# Patient Record
Sex: Female | Born: 1940 | Race: Black or African American | Hispanic: No | Smoking: Never smoker
Health system: Southern US, Community
[De-identification: ages and names within clinical notes are randomized; demographics above are authoritative.]

## PROBLEM LIST (undated history)

## (undated) DIAGNOSIS — M1990 Unspecified osteoarthritis, unspecified site: Secondary | ICD-10-CM

## (undated) DIAGNOSIS — I639 Cerebral infarction, unspecified: Secondary | ICD-10-CM

## (undated) DIAGNOSIS — R569 Unspecified convulsions: Secondary | ICD-10-CM

---

## 1999-07-11 HISTORY — PX: HERNIA REPAIR: SHX51

## 2015-01-18 ENCOUNTER — Emergency Department (HOSPITAL_COMMUNITY): Payer: Self-pay

## 2015-01-18 ENCOUNTER — Emergency Department (HOSPITAL_COMMUNITY)
Admission: EM | Admit: 2015-01-18 | Discharge: 2015-01-18 | Disposition: A | Discharge status: Home / Self Care | Payer: Self-pay | Attending: Emergency Medicine | Admitting: Emergency Medicine

## 2015-01-18 ENCOUNTER — Emergency Department (HOSPITAL_BASED_OUTPATIENT_CLINIC_OR_DEPARTMENT_OTHER): Payer: Self-pay

## 2015-01-18 ENCOUNTER — Encounter (HOSPITAL_COMMUNITY): Payer: Self-pay | Admitting: *Deleted

## 2015-01-18 DIAGNOSIS — Z79899 Other long term (current) drug therapy: Secondary | ICD-10-CM | POA: Insufficient documentation

## 2015-01-18 DIAGNOSIS — M79609 Pain in unspecified limb: Secondary | ICD-10-CM

## 2015-01-18 DIAGNOSIS — M79622 Pain in left upper arm: Secondary | ICD-10-CM | POA: Insufficient documentation

## 2015-01-18 DIAGNOSIS — Z8739 Personal history of other diseases of the musculoskeletal system and connective tissue: Secondary | ICD-10-CM | POA: Insufficient documentation

## 2015-01-18 DIAGNOSIS — M7989 Other specified soft tissue disorders: Secondary | ICD-10-CM

## 2015-01-18 DIAGNOSIS — Z8673 Personal history of transient ischemic attack (TIA), and cerebral infarction without residual deficits: Secondary | ICD-10-CM | POA: Insufficient documentation

## 2015-01-18 DIAGNOSIS — R531 Weakness: Secondary | ICD-10-CM | POA: Insufficient documentation

## 2015-01-18 DIAGNOSIS — R079 Chest pain, unspecified: Secondary | ICD-10-CM | POA: Insufficient documentation

## 2015-01-18 HISTORY — DX: Unspecified osteoarthritis, unspecified site: M19.90

## 2015-01-18 HISTORY — DX: Cerebral infarction, unspecified: I63.9

## 2015-01-18 LAB — I-STAT TROPONIN, ED: Troponin i, poc: 0.01 ng/mL (ref 0.00–0.08)

## 2015-01-18 LAB — CBC
HCT: 35.7 % — ABNORMAL LOW (ref 36.0–46.0)
HEMOGLOBIN: 13.2 g/dL (ref 12.0–15.0)
MCH: 31.3 pg (ref 26.0–34.0)
MCHC: 37 g/dL — ABNORMAL HIGH (ref 30.0–36.0)
MCV: 84.6 fL (ref 78.0–100.0)
PLATELETS: 279 10*3/uL (ref 150–400)
RBC: 4.22 MIL/uL (ref 3.87–5.11)
RDW: 17.4 % — ABNORMAL HIGH (ref 11.5–15.5)
WBC: 11.2 10*3/uL — ABNORMAL HIGH (ref 4.0–10.5)

## 2015-01-18 LAB — BASIC METABOLIC PANEL
Anion gap: 8 (ref 5–15)
BUN: 11 mg/dL (ref 6–20)
CALCIUM: 9 mg/dL (ref 8.9–10.3)
CO2: 26 mmol/L (ref 22–32)
CREATININE: 0.89 mg/dL (ref 0.44–1.00)
Chloride: 106 mmol/L (ref 101–111)
Glucose, Bld: 87 mg/dL (ref 65–99)
Potassium: 3.6 mmol/L (ref 3.5–5.1)
SODIUM: 140 mmol/L (ref 135–145)

## 2015-01-18 LAB — URINALYSIS, ROUTINE W REFLEX MICROSCOPIC
BILIRUBIN URINE: NEGATIVE
GLUCOSE, UA: NEGATIVE mg/dL
Hgb urine dipstick: NEGATIVE
Ketones, ur: NEGATIVE mg/dL
NITRITE: NEGATIVE
PH: 6.5 (ref 5.0–8.0)
Protein, ur: NEGATIVE mg/dL
Specific Gravity, Urine: 1.022 (ref 1.005–1.030)
Urobilinogen, UA: 0.2 mg/dL (ref 0.0–1.0)

## 2015-01-18 LAB — URINE MICROSCOPIC-ADD ON

## 2015-01-18 MED ORDER — IOHEXOL 350 MG/ML SOLN
80.0000 mL | Freq: Once | INTRAVENOUS | Status: AC | PRN
  Administered 2015-01-18: 80 mL via INTRAVENOUS

## 2015-01-18 NOTE — ED Provider Notes (Signed)
CSN: [id number]     Arrival date & time 01/18/15  1307 History   First MD Initiated Contact with Patient 01/18/15 1308     Chief Complaint  Patient presents with  . Arm Pain  . Hip Pain     (Consider location/radiation/quality/duration/timing/severity/associated sxs/prior Treatment) Patient is a 74 y.o. female presenting with chest pain and hip pain. The history is provided by a relative. The history is limited by a language barrier.  Chest Pain Pain location:  Substernal area Pain quality: sharp   Pain radiates to:  Does not radiate Pain radiates to the back: no   Pain severity:  Moderate Onset quality:  Gradual Duration:  4 days Timing:  Constant Progression:  Unchanged Chronicity:  New Context comment:  Recent travel from [place] Relieved by:  Nothing Worsened by:  Nothing tried Ineffective treatments:  None tried Associated symptoms: no abdominal pain, no fever and no lower extremity edema   Associated symptoms comment:  L hip pain, l leg pain Hip Pain This is a recurrent problem. The current episode started 2 days ago. The problem occurs constantly. The problem has not changed since onset.Associated symptoms include chest pain. Pertinent negatives include no abdominal pain. Nothing aggravates the symptoms. Nothing relieves the symptoms. She has tried nothing for the symptoms.    Past Medical History  Diagnosis Date  . Stroke   . Arthritis    History reviewed. No pertinent past surgical history. No family history on file. History  Substance Use Topics  . Smoking status: Never Smoker   . Smokeless tobacco: Not on file  . Alcohol Use: No   OB History    No data available     Review of Systems  Constitutional: Negative for fever.  Cardiovascular: Positive for chest pain.  Gastrointestinal: Negative for abdominal pain.  All other systems reviewed and are negative.     Allergies  Review of patient's allergies indicates no known allergies.  Home Medications    Prior to Admission medications   Medication Sig Start Date End Date Taking? Authorizing Provider  Cholecalciferol (VITAMIN D-3 PO) Take 5 mLs by mouth daily.   Yes Historical Provider, MD  Cyanocobalamin (VITAMIN B-12 PO) Take 5 mLs by mouth daily.   Yes Historical Provider, MD  dexamethasone (DECADRON) 2 MG tablet Take 1 mg by mouth daily. Daughter states she has two more weeks left on medication   Yes Historical Provider, MD  omeprazole (PRILOSEC) 20 MG capsule Take 20 mg by mouth daily.   Yes Historical Provider, MD  PRESCRIPTION MEDICATION Take 1 tablet by mouth daily. Medication Name: Cebrotonin  tablet (European Medication) per daughter   Yes Historical Provider, MD  PRESCRIPTION MEDICATION Take 10 mLs by mouth daily. Medication Name: Somazina  (Europen Medication) per daughter   Yes Historical Provider, MD  Pyridoxine HCl (VITAMIN B-6 PO) Take 5 mLs by mouth daily.   Yes Historical Provider, MD   BP 146/69 mmHg  Pulse 58  Temp(Src) 98 F (36.7 C) (Oral)  Resp 17  Wt 180 lb (81.647 kg)  SpO2 99% Physical Exam  Constitutional: She is oriented to person, place, and time. She appears well-developed and well-nourished.  HENT:  Head: Normocephalic and atraumatic.  Right Ear: External ear normal.  Left Ear: External ear normal.  Eyes: Conjunctivae and EOM are normal. Pupils are equal, round, and reactive to light.  Neck: Normal range of motion. Neck supple.  Cardiovascular: Normal rate, regular rhythm, normal heart sounds and intact distal pulses.  Pulmonary/Chest: Effort normal and breath sounds normal.  Abdominal: Soft. Bowel sounds are normal. There is no tenderness.  Musculoskeletal: Normal range of motion.       Cervical back: Normal.       Thoracic back: Normal.       Lumbar back: She exhibits tenderness and bony tenderness.       Left lower leg: She exhibits swelling (<1 cm asymmetry with opposite side).  Neurological: She is alert and oriented to person,  place, and time.  Skin: Skin is warm and dry.  Vitals reviewed.   ED Course  Procedures (including critical care time) Labs Review Labs Reviewed  CBC - Abnormal; Notable for the following:    WBC 11.2 (*)    HCT 35.7 (*)    MCHC 37.0 (*)    RDW 17.4 (*)    All other components within normal limits  URINALYSIS, ROUTINE W REFLEX MICROSCOPIC (NOT AT [facility]) - Abnormal; Notable for the following:    Leukocytes, UA SMALL (*)    All other components within normal limits  BASIC METABOLIC PANEL  URINE MICROSCOPIC-ADD ON  I-STAT TROPOININ, ED    Imaging Review No results found.   EKG Interpretation   Date/Time:  Monday January 18 2015 13:19:55 EDT Ventricular Rate:  59 PR Interval:  164 QRS Duration: 86 QT Interval:  481 QTC Calculation: 476 R Axis:   -65 Text Interpretation:  Ectopic atrial rhythm Inferior infarct, age  indeterminate Probable anterior infarct, age indeterminate No significant  change since last tracing Confirmed by Mirian MoGentry, Matthew [phone]([id number]) on  01/18/2015 2:20:05 PM      MDM   Final diagnoses:  Weakness    74 y.o. female with pertinent PMH of reported CVA with L sided deficit which occurred in [place] presents with chest pain, L leg pain and swelling.  I examined the pt's limited records from [place] which only demonstrated a negative MRI of the head.  She has had persistent L sided weakness since 3 months ago, started shortly after she flew to [place] for the death of her son.  She denies increase in neuro symptoms.  L leg mildly swollen on L, however otherwise consistent with sciatica, has reproduction of pain with palpation of L paraspinal muscles of lumbar spine.  Given that she has had recent travel, will obtain DVT study of L LE, as well as CT scan of chest.     Pt care to Dr. Rubin PayorPickering pending dispo.   I have reviewed all laboratory and imaging studies if ordered as above  1. Weakness        Mirian MoMatthew Gentry, MD 01/21/15 1700

## 2015-01-18 NOTE — ED Provider Notes (Signed)
  Physical Exam  BP 151/85 mmHg  Pulse 53  Temp(Src) 98 F (36.7 C) (Oral)  Resp 20  Wt 180 lb (81.647 kg)  SpO2 99%  Physical Exam  ED Course  Procedures  MDM Patient with some left-sided tightness. Has had previous hemorrhagic stroke in [place]. Imaging reports reviewed from there. Head CT is likely evolution of the changes from that. Some leg pain and negative Doppler. Negative chest CT for PE but does have some pulmonary hypertension. She has a primary care doctor she can follow-up with. Will have follow-up with neurology. Discussed with neuro radiolog.      Nation Cradle PickerBenjiman Coreing, MD 01/18/15 [phone]

## 2015-01-18 NOTE — ED Notes (Signed)
MD at bedside. 

## 2015-01-18 NOTE — ED Notes (Signed)
Per pts family- pt recently returned from a trip to [place] and has left arm and hip pain. Pt denies any new injuries. Pt has previous stroke with left sided weakness. Pt uses a wheelchair at baseline. Pt also reports central chest pain.

## 2015-01-18 NOTE — ED Notes (Signed)
CSW/RN CM spoke with pt's daughter/grandaughter re: applying for Medicaid for pt and [facility].  RNCM also exploring the [facility].  CSW will continue to follow.

## 2015-01-18 NOTE — Discharge Instructions (Signed)

## 2015-01-18 NOTE — Progress Notes (Signed)
VASCULAR LAB PRELIMINARY  PRELIMINARY  PRELIMINARY  PRELIMINARY  Left lower extremity venous duplexompleted.    Preliminary report:  Left:  No evidence of DVT, superficial thrombosis, or Baker's cyst.  Reva Pinkley, RVS 01/18/2015, 4:45 PM

## 2015-01-20 ENCOUNTER — Ambulatory Visit: Payer: Self-pay | Admitting: Neurology

## 2015-02-03 ENCOUNTER — Ambulatory Visit: Payer: Self-pay | Admitting: Neurology

## 2015-02-03 ENCOUNTER — Encounter: Payer: Self-pay | Admitting: Neurology

## 2015-02-03 ENCOUNTER — Ambulatory Visit (INDEPENDENT_AMBULATORY_CARE_PROVIDER_SITE_OTHER): Payer: Self-pay | Admitting: Neurology

## 2015-02-03 VITALS — BP 146/92 | HR 85 | Temp 98.0°F | Ht 63.0 in

## 2015-02-03 DIAGNOSIS — R471 Dysarthria and anarthria: Secondary | ICD-10-CM

## 2015-02-03 DIAGNOSIS — I610 Nontraumatic intracerebral hemorrhage in hemisphere, subcortical: Secondary | ICD-10-CM

## 2015-02-03 DIAGNOSIS — G8190 Hemiplegia, unspecified affecting unspecified side: Secondary | ICD-10-CM

## 2015-02-03 DIAGNOSIS — G8110 Spastic hemiplegia affecting unspecified side: Secondary | ICD-10-CM | POA: Insufficient documentation

## 2015-02-03 DIAGNOSIS — I619 Nontraumatic intracerebral hemorrhage, unspecified: Secondary | ICD-10-CM | POA: Insufficient documentation

## 2015-02-03 MED ORDER — BACLOFEN 10 MG PO TABS: 10.0000 mg | ORAL_TABLET | Freq: Three times a day (TID) | ORAL | Status: DC

## 2015-02-03 NOTE — Progress Notes (Addendum)
[facility]    Provider:  Dr Lucia Gaskins Referring Provider: No ref. provider found Primary Care Physician:  Jackie Plum, MD  CC:  stroke  HPI:  Michelle Kemp is a 74 y.o. female here as a referral for stoke. She was in [place] in April. She is with her daughters who act as interpreters (she was not flagged, will ask for interpreter at next appointment). One morning after eating breakfast she couldn't get up. Her left side was not moving. No vision changes, could see from both eyes. She couldn't talk very well. She is feeling better. She is still weak on the left. Her left arm and leg still weak.Experiencing stiffness and pain in the left arm and leg. Her speech is much better. She denies sensory changes on the left side. Things are getting better per daughter. They could not pinpoint what caused the hemorrhage. Spent 8 days in the hospital. Not choking on food. Has a good appetite.    Reviewed notes, labs and imaging from outside physicians, which showed:  CT of the head 01/18/2015 (personally reviewed images): Study discussed by telephone with Dr. Carmell Austria on 01/18/2015 at 1725 hrs. 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 No new symptoms since that time.  The noncontrast CT appearance today is compatible with an evolving hemorrhagic infarct. No associated mass effect identified.  I advised outpatient Neurology and Brain MRI follow-up for this Patient.  Bmp, cbc wnl  Review of Systems: Patient complains of symptoms per HPI as well as the following symptoms: headache, snoring, joint pain, cramps, aching muscles, coordination problem. Pertinent negatives per HPI. All others negative.   History   Social History  . Marital Status: Widowed    Spouse Name: N/A  . Number of Children: 2  . Years of Education: N/A   Occupational History  . Not on file.   Social  History Main Topics  . Smoking status: Never Smoker   . Smokeless tobacco: Not on file  . Alcohol Use: No  . Drug Use: No  . Sexual Activity: Not on file   Other Topics Concern  . Not on file   Social History Narrative   Lives with family   Caffeine use: none     Family History  Problem Relation Age of Onset  . Arthritis    . Stroke Neg Hx     Past Medical History  Diagnosis Date  . Stroke   . Arthritis     Past Surgical History  Procedure Laterality Date  . Hernia repair  2001    Current Outpatient Prescriptions  Medication Sig Dispense Refill  . Cholecalciferol (VITAMIN D-3 PO) Take 5 mLs by mouth daily.    . Cyanocobalamin (VITAMIN B-12 PO) Take 5 mLs by mouth daily.    Marland Kitchen PRESCRIPTION MEDICATION Take 1 tablet by mouth daily. Medication Name: Cebrotonin  tablet (European Medication) per daughter    . PRESCRIPTION MEDICATION Take 10 mLs by mouth daily. Medication Name: Somazina  (Europen Medication) per daughter    . Pyridoxine HCl (VITAMIN B-6 PO) Take 5 mLs by mouth daily.    . baclofen (LIORESAL) 10 MG tablet Take 1 tablet (10 mg total) by mouth 3 (three) times daily. 90 each 6  . dexamethasone (DECADRON) 2 MG tablet Take 1 mg by mouth daily. Daughter states she has finished this medication.    Marland Kitchen omeprazole (PRILOSEC) 20 MG capsule Take 20 mg by mouth  daily.     No current facility-administered medications for this visit.    Allergies as of 02/03/2015  . (No Known Allergies)    Vitals: BP 146/92 mmHg  Pulse 85  Temp(Src) 98 F (36.7 C) (Oral)  Ht 5\' 3"  (1.6 m) Last Weight:  Wt Readings from Last 1 Encounters:  01/18/15 180 lb (81.647 kg)   Last Height:   Ht Readings from Last 1 Encounters:  02/03/15 5\' 3"  (1.6 m)   Physical exam: Exam: Gen: NAD, conversant, well nourised, obese, well groomed                     CV: RRR, no MRG. No Carotid Bruits. No peripheral edema, warm, nontender Eyes: Conjunctivae clear without exudates or  hemorrhage  Neuro: Detailed Neurologic Exam  Speech:    Speech is normal; fluent and spontaneous with normal comprehension.  Cognition:    The patient is oriented to person, place, and time;     recent and remote memory intact;     language fluent;     normal attention, concentration,     fund of knowledge Cranial Nerves:    The pupils are equal, round, and reactive to light. attempted fundi, could not visualize. . Visual fields are full to finger confrontation. Extraocular movements are intact. Trigeminal sensation is intact and the muscles of mastication are normal. The face is symmetric. The palate elevates in the midline. Hearing intact. Voice is normal. Shoulder shrug is normal. The tongue has normal motion without fasciculations.   Coordination:    Cannot perform on the left due to weakness  Gait:     Attempted, cannot stand without considerable assistance.     Motor Observation: No tremor Tone:    Increased left side    Posture:    Posture is normal in the wheelchair    Strength:       Left hemiparesis 1-2/5     Sensation: intact to LT bilaterally, denies hemisensory loss     Reflex Exam:  DTR's:    Deep tendon reflexes in the upper and lower extremities are brisk on the left    Toes:    Left upgoing   Clonus:    Clonus left achilles.    Assessment/Plan:  74 year old female with  right centrum semiovale hemorrhagic infarct in [place] in June. Resultant left spastic hemiparesis. Denies any significant PMHx or HTN.   MRI of the brain and MRA of the head ordered Spasticity - self pay, may consider botox in the future with medicaid approval. For now will start Baclofen. Stretching with PT. Discussed in detail spasticity after stroke.  Referral to PT, OT and speech Here with lovely daughters who provide suppport.  She will get records from [place] and bring them to next appointment.  Naomie Dean, MD  [facility] [address] [place], [place] [address]  Phone [phone] Fax [phone]

## 2015-02-03 NOTE — Patient Instructions (Signed)
Overall you are doing fairly well but I do want to suggest a few things today:   Remember to drink plenty of fluid, eat healthy meals and do not skip any meals. Try to eat protein with a every meal and eat a healthy snack such as fruit or nuts in between meals. Try to keep a regular sleep-wake schedule and try to exercise daily, particularly in the form of walking, 20-30 minutes a day, if you can.   As far as your medications are concerned, I would like to suggest: Baclofen  three times a day  As far as diagnostic testing: MRI of the brain and MRA of the head Physical and occupational and speech therapy  I would like to see you back in 2 months, sooner if we need to. Please call us with any interim questions, concerns, problems, updates or refill requests.   Please also call us for any test results so we can go over those with you on the phone.  My clinical assistant and will answer any of your questions and relay your messages to me and also relay most of my messages to you.   Our phone number is [phone]. We also have an after hours call service for urgent matters and there is a physician on-call for urgent questions. For any emergencies you know to call 911 or go to the nearest emergency room

## 2015-02-05 ENCOUNTER — Other Ambulatory Visit: Payer: Self-pay | Admitting: Neurology

## 2015-02-05 ENCOUNTER — Telehealth: Payer: Self-pay | Admitting: Rehabilitative and Restorative Service Providers"

## 2015-02-05 ENCOUNTER — Ambulatory Visit: Payer: Self-pay | Attending: Neurology | Admitting: Rehabilitative and Restorative Service Providers"

## 2015-02-05 DIAGNOSIS — M6281 Muscle weakness (generalized): Secondary | ICD-10-CM | POA: Insufficient documentation

## 2015-02-05 DIAGNOSIS — G8110 Spastic hemiplegia affecting unspecified side: Secondary | ICD-10-CM

## 2015-02-05 DIAGNOSIS — R269 Unspecified abnormalities of gait and mobility: Secondary | ICD-10-CM | POA: Insufficient documentation

## 2015-02-05 NOTE — Telephone Encounter (Signed)
Ms. Laatsch was evaluated today in PT.  Please submit an OT order in epic (only has PT and ST order-daughter wants to hold off on ST due to self pay).   Thank you, Terelle Dobler, PT

## 2015-02-06 NOTE — Therapy (Signed)
[facility] [address] [place], [place], [address] Phone: [phone]   Fax:  [phone]  Physical Therapy Evaluation  Patient Details  Name: Michelle Kemp MRN: [id number] Date of Birth: [birth year] Referring Provider:  Anson Fret, MD  Encounter Date: 02/05/2015      PT End of Session - 02/06/15 0737    Visit Number 1   Number of Visits 16   Date for PT Re-Evaluation 04/08/15   Authorization Type Self pay, no eligibility to apply for benefits per daughter's reports   PT Start Time 1450   PT Stop Time 1540   PT Time Calculation (min) 50 min   Equipment Utilized During Treatment Gait belt   Activity Tolerance Patient tolerated treatment well   Behavior During Therapy [facility] for tasks assessed/performed      Past Medical History  Diagnosis Date  . Stroke   . Arthritis     Past Surgical History  Procedure Laterality Date  . Hernia repair  2001    There were no vitals filed for this visit.  Visit Diagnosis:  Muscle weakness  Abnormality of gait  Spastic hemiplegia affecting nondominant side      Subjective Assessment - 02/05/15 1445    Subjective The patient has CVA while visiting family in [place] 11/07/2014.  She had an infection in May in her lungs and her daughter reports that she had a big set back with hospitalization at that time.  [place] and is living with her daughter.  Her current mobility is dependent for all transfers using a hoyer lift.   Patient is accompained by: Family member   Pertinent History daughter and granddaughter/ daughter interprets   Patient Stated Goals Improve mobility and endurance   Currently in Pain? Yes   Pain Score 0-No pain  currently, hurts with some movement   Pain Location Knee   Pain Orientation Left   Pain Descriptors / Indicators Sore   Pain Onset More than a month ago   Pain Frequency Intermittent   Aggravating  Factors  knee flexion    *No pain goal for PT--PT will monitor pain with weight bearing and modify treatment plan to tolerance.        [facility] PT Assessment - 02/05/15 1456    Assessment   Medical Diagnosis R hemiplegia, subcortical hemorrhage   Onset Date/Surgical Date --  11/07/2014   Prior Therapy --  in hospital in [place]   Precautions   Precautions Fall   Balance Screen   Has the patient fallen in the past 6 months No   Has the patient had a decrease in activity level because of a fear of falling?  No   Is the patient reluctant to leave their home because of a fear of falling?  No   Home Environment   Living Environment Private residence   Living Arrangements Children   Available Help at Discharge Family   Type of Home House   Home Access Level entry   Home Layout Able to live on main level with bedroom/bathroom;Two level   Home Equipment Bedside commode;Wheelchair - manual;Hospital bed  [employer] lift   Prior Function   Level of Independence Independent  was living with daughter, lost 19 family members over 2 yrs   Observation/Other Assessments   Focus on Therapeutic Outcomes (FOTO)  did not capture due to language barrier   Sensation   Light Touch Appears Intact   ROM / Strength  AROM / PROM / Strength AROM;Strength   AROM   Overall AROM Comments patient maintains L UE in sling with subluxation noted in shoulder   Strength   Overall Strength --  L side dense hemiplegia UE/LE   Overall Strength Comments R hip flexion 3/5, R knee flexion/extension 4/5, R ankle DF 4/5, L side with dense hemiplegia with only minimal movement noted/no functional use   Bed Mobility   Bed Mobility --  +2 assist for all bed mobility per daughter's reports   Transfers   Transfers Lateral/Scoot Transfers;Sit to Stand;Sit to Supine;Supine to Sit   Sit to Stand 1: +2 Total assist   Lateral/Scoot Transfers 1: +2 Total assist  with sliding board   Supine to Sit 1: +2 Total assist  per  daughter's reports in the home   Sit to Supine 1: +2 Total assist  per daughter's reports in the home   Ambulation/Gait   Ambulation/Gait No  Nonambulatory at this time.   Wheelchair Mobility   Wheelchair Mobility No  patient is propelled by family   Occupational hygienist Assessed Yes   Programmer, applications Sitting - Balance Support Right upper extremity supported   Static Sitting - Level of Assistance 6: Modified independent (Device/Increase time)   Static Sitting - Comment/# of Minutes --  >5 minutes            PT Short Term Goals - 02/06/15 0739    PT SHORT TERM GOAL #1   Title The patient will perform home program with assist from family members.   Baseline Target date 03/08/2015   Time 4   Period Weeks   PT SHORT TERM GOAL #2   Title The patient will move sit<>supine with mod assist of 1 person (baseline +2 total assist).   Baseline Target date 03/08/2015   Time 4   Period Weeks   PT SHORT TERM GOAL #3   Title The patient will transfer with sliding board with mod assist of 1 person (baseline +2 total assist).   Baseline Target date 03/08/2015   Time 4   Period Weeks   PT SHORT TERM GOAL #4   Title The patient will self propel manual wheelchair with minimal verbal cues.   Baseline Target date 03/08/2015   Time 4   Period Weeks   PT SHORT TERM GOAL #5   Title The patient will maintain standing with +2 assist x 2 minutes for improved weight bearing.   Baseline Target date 03/08/2015   Time 4   Period Weeks           PT Long Term Goals - 02/06/15 0745    PT LONG TERM GOAL #1   Title The patient/family will verbalize understanding of CVA warning signs/risk factors.   Baseline Target date 04/08/2015   Time 8   Period Weeks   PT LONG TERM GOAL #2   Title The patient will perform sliding board transfers with min A of 1 person w/c<>mat.   Baseline Target date 04/08/2015   Time 8   Period Weeks   PT LONG TERM GOAL #3   Title The patient will perform  sit<>supine with min A of 1 person.   Baseline Target date 04/08/2015   Time 8   Period Weeks   PT LONG TERM GOAL #4   Title The patient will move sit<>stand with mod A of 1 person.     Baseline Target date 04/08/2015   Time 8  Period Weeks   PT LONG TERM GOAL #5   Title Ambulation goal to follow if patient progresses to gait activities.   Baseline Target date 04/08/2015   Time 8   Period Weeks               Plan - 02/06/15 0749    Clinical Impression Statement The patient is a 74 yo female s/p CVA 11/07/2014 with dense L hemiplegia requiring total assist for mobility.   Pt will benefit from skilled therapeutic intervention in order to improve on the following deficits Abnormal gait;Decreased balance;Decreased mobility;Decreased strength;Postural dysfunction;Impaired flexibility;Pain;Decreased activity tolerance;Decreased endurance;Increased muscle spasms;Difficulty walking   Rehab Potential Good   PT Frequency 2x / week   PT Duration 8 weeks   PT Treatment/Interventions Electrical Stimulation;Gait training;Stair training;Neuromuscular re-education;Balance training;Therapeutic exercise;Therapeutic activities;Functional mobility training;Patient/family education;Wheelchair mobility training;Manual techniques;Orthotic Fit/Training   PT Next Visit Plan NEED +2 ASSIST FOR MOBILITY; sliding board transfers (try to the L side as patient may be able to push better from the right), bed mobility, supine ther ex for HEP.   Recommended Other Services Recommend OT evaluation for L UE hemiplegia   Consulted and Agree with Plan of Care Patient;Family member/caregiver   Family Member Consulted daughter and granddaughter         Problem List Patient Active Problem List   Diagnosis Date Noted  . Hemorrhage, intracerebral 02/03/2015  . Hemiparesis 02/03/2015  . Spastic hemiplegia affecting nondominant side 02/03/2015    Thank you for the referral of this patient. Margretta Ditty,  MPT   Debrah Granderson, PT 02/06/2015, 7:53 AM  [facility] [address] [place], [place], [address] Phone: [phone]   Fax:  [phone]

## 2015-02-10 ENCOUNTER — Encounter: Payer: Self-pay | Admitting: Physical Therapy

## 2015-02-10 ENCOUNTER — Ambulatory Visit: Payer: Self-pay | Attending: Neurology | Admitting: Physical Therapy

## 2015-02-10 DIAGNOSIS — R2681 Unsteadiness on feet: Secondary | ICD-10-CM | POA: Insufficient documentation

## 2015-02-10 DIAGNOSIS — R293 Abnormal posture: Secondary | ICD-10-CM | POA: Insufficient documentation

## 2015-02-10 DIAGNOSIS — M25512 Pain in left shoulder: Secondary | ICD-10-CM | POA: Insufficient documentation

## 2015-02-10 DIAGNOSIS — M79642 Pain in left hand: Secondary | ICD-10-CM | POA: Insufficient documentation

## 2015-02-10 DIAGNOSIS — M25612 Stiffness of left shoulder, not elsewhere classified: Secondary | ICD-10-CM | POA: Insufficient documentation

## 2015-02-10 DIAGNOSIS — M6281 Muscle weakness (generalized): Secondary | ICD-10-CM | POA: Insufficient documentation

## 2015-02-10 DIAGNOSIS — R269 Unspecified abnormalities of gait and mobility: Secondary | ICD-10-CM | POA: Insufficient documentation

## 2015-02-10 DIAGNOSIS — G8110 Spastic hemiplegia affecting unspecified side: Secondary | ICD-10-CM | POA: Insufficient documentation

## 2015-02-11 ENCOUNTER — Telehealth: Payer: Self-pay | Admitting: *Deleted

## 2015-02-11 NOTE — Telephone Encounter (Signed)
Patient form on Emma desk. 

## 2015-02-11 NOTE — Therapy (Signed)
[facility] [address] [place], [place], [address] Phone: [phone]   Fax:  [phone]  Physical Therapy Treatment  Patient Details  Name: Michelle Kemp MRN: [id number] Date of Birth: 04/22/1941 Referring Provider:  Jackie Plum, MD  Encounter Date: 02/10/2015      PT End of Session - 02/10/15 1455    Visit Number 2   Number of Visits 16   Date for PT Re-Evaluation 04/08/15   Authorization Type Self pay, no eligibility to apply for benefits per daughter's reports   PT Start Time 1445   PT Stop Time 1530   PT Time Calculation (min) 45 min   Equipment Utilized During Treatment Gait belt   Activity Tolerance Patient tolerated treatment well   Behavior During Therapy [facility] [place] for tasks assessed/performed      Past Medical History  Diagnosis Date  . Stroke   . Arthritis     Past Surgical History  Procedure Laterality Date  . Hernia repair  2001    There were no vitals filed for this visit.  Visit Diagnosis:  Muscle weakness  Abnormality of gait  Spastic hemiplegia affecting nondominant side      Subjective Assessment - 02/10/15 1453    Subjective No new complaints. No falls. Some back pain.   Currently in Pain? Yes   Pain Score 3    Pain Location Back   Pain Orientation Lower   Pain Descriptors / Indicators Sore;Aching   Pain Type Chronic pain   Pain Onset More than a month ago   Pain Frequency Intermittent   Aggravating Factors  poor posture, body mechanics   Pain Relieving Factors positioning in bed.     Treatment:  2 person slide board transfer from wheelchair to mat table. Cues (verbal/visual/tactile) on use of right arm and leg to assist with the transfer.  Mod assist to lie down supine on mat with second person for safety. Cues provided on technique.  Passive left leg flexion/extension, working in the PNF patterns. Pt able to assist some with extension portion vs extensor tone  kicking in.  Passive stretching of hip adductor and hip abductor's.  Mod assist with cues to roll onto right side. With powder board: left hip/knee flexion/extension, with hip in flexion- knee flexion/extension performed with minimal to trace pt assistance.   Mod assist with cues to roll onto back and then to roll onto left side. Mod assist of 1 to sit up edge of mat with cues.  Sit<>stand x1 with 2 person assist: using sheet around pt's pelvis, left leg blocked completely. Mod to max assist of 2 to achieve standing. Once standing: with cues pt was able to bring trunk up into extension and accept weight onto her left leg while blocked (with assist of extensor tone).   2 person mod assist for slide board transfer mat to wheelchair with cues as stated above.  *pt responds very well to gestures and tactile/visual cues, daughter translates as well, permission documents have been signed*        PT Short Term Goals - 02/06/15 0739    PT SHORT TERM GOAL #1   Title The patient will perform home program with assist from family members.   Baseline Target date 03/08/2015   Time 4   Period Weeks   PT SHORT TERM GOAL #2   Title The patient will move sit<>supine with mod assist of 1 person (baseline +2 total assist).   Baseline Target date 03/08/2015  Time 4   Period Weeks   PT SHORT TERM GOAL #3   Title The patient will transfer with sliding board with mod assist of 1 person (baseline +2 total assist).   Baseline Target date 03/08/2015   Time 4   Period Weeks   PT SHORT TERM GOAL #4   Title The patient will self propel manual wheelchair with minimal verbal cues.   Baseline Target date 03/08/2015   Time 4   Period Weeks   PT SHORT TERM GOAL #5   Title The patient will maintain standing with +2 assist x 2 minutes for improved weight bearing.   Baseline Target date 03/08/2015   Time 4   Period Weeks           PT Long Term Goals - 02/06/15 0745    PT LONG TERM GOAL #1   Title The  patient/family will verbalize understanding of CVA warning signs/risk factors.   Baseline Target date 04/08/2015   Time 8   Period Weeks   PT LONG TERM GOAL #2   Title The patient will perform sliding board transfers with min A of 1 person w/c<>mat.   Baseline Target date 04/08/2015   Time 8   Period Weeks   PT LONG TERM GOAL #3   Title The patient will perform sit<>supine with min A of 1 person.   Baseline Target date 04/08/2015   Time 8   Period Weeks   PT LONG TERM GOAL #4   Title The patient will move sit<>stand with mod A of 1 person.     Baseline Target date 04/08/2015   Time 8   Period Weeks   PT LONG TERM GOAL #5   Title Ambulation goal to follow if patient progresses to gait activities.   Baseline Target date 04/08/2015   Time 8   Period Weeks           Plan - 02/10/15 1456    Clinical Impression Statement Pt able to assist with slide board transfer more today with going toward her hemiplegic side. Trace hamstring and hip flexor activation noted in supine and sidelying exercise. Pt also noted to have extensor tone with standing today and ability to take weight on her left leg with knee blocked. HEP not issued as pt is working private pay with PT at home on weekends who has her doing bed exercises already. Pt making steady progress toward goals.                   Pt will benefit from skilled therapeutic intervention in order to improve on the following deficits Abnormal gait;Decreased balance;Decreased mobility;Decreased strength;Postural dysfunction;Impaired flexibility;Pain;Decreased activity tolerance;Decreased endurance;Increased muscle spasms;Difficulty walking   Rehab Potential Good   PT Frequency 2x / week   PT Duration 8 weeks   PT Treatment/Interventions Electrical Stimulation;Gait training;Stair training;Neuromuscular re-education;Balance training;Therapeutic exercise;Therapeutic activities;Functional mobility training;Patient/family education;Wheelchair mobility  training;Manual techniques;Orthotic Fit/Training   PT Next Visit Plan NEED +2 ASSIST FOR MOBILITY; sliding board transfers (try to the L side as patient may be able to push better from the right), bed mobility, transfer and standing; continue to work on left leg strength, ?NMES for quad activation                   Consulted and Agree with Plan of Care Patient;Family member/caregiver   Family Member Consulted daughter and granddaughter        Problem List Patient Active Problem List   Diagnosis Date Noted  .  Hemorrhage, intracerebral 02/03/2015  . Hemiparesis 02/03/2015  . Spastic hemiplegia affecting nondominant side 02/03/2015    Sallyanne Kuster 02/11/2015, 7:59 PM  Sallyanne Kuster, PTA, [facility] [address], [address] [place], [place] [address] [phone] 02/11/2015, 7:59 PM

## 2015-02-12 ENCOUNTER — Ambulatory Visit: Payer: Self-pay | Admitting: Rehabilitative and Restorative Service Providers"

## 2015-02-12 DIAGNOSIS — R269 Unspecified abnormalities of gait and mobility: Secondary | ICD-10-CM

## 2015-02-12 DIAGNOSIS — M6281 Muscle weakness (generalized): Secondary | ICD-10-CM

## 2015-02-13 NOTE — Therapy (Signed)
[facility] [address] [place], [place], [address] Phone: [phone]   Fax:  [phone]  Physical Therapy Treatment  Patient Details  Name: Michelle Kemp MRN: [id number] Date of Birth: 1941/05/19 Referring Provider:  Jackie Plum, MD  Encounter Date: 02/12/2015      PT End of Session - 02/13/15 0717    Visit Number 3   Number of Visits 16   Date for PT Re-Evaluation 04/08/15   Authorization Type Self pay, no eligibility to apply for benefits per daughter's reports   PT Start Time 1400   PT Stop Time 1455   PT Time Calculation (min) 55 min   Equipment Utilized During Treatment Gait belt   Activity Tolerance Patient tolerated treatment well   Behavior During Therapy [facility] for tasks assessed/performed      Past Medical History  Diagnosis Date  . Stroke   . Arthritis     Past Surgical History  Procedure Laterality Date  . Hernia repair  2001    There were no vitals filed for this visit.  Visit Diagnosis:  Muscle weakness  Abnormality of gait      Subjective Assessment - 02/13/15 0716    Subjective The patient is read to stand.   Patient is accompained by: Family member   Pertinent History daughter and granddaughter/ daughter interprets   Currently in Pain? Yes   Pain Score 3    Pain Location Back   Pain Orientation Lower   Pain Descriptors / Indicators Aching   Pain Type Chronic pain   Pain Onset More than a month ago   Pain Frequency Intermittent     THERAPEUTIC ACTIVITIES: W/c<>mat transfer with sliding board and mod A of 1 person with exaggeration of head/hips to facilitate transfer (patient flexing toward's PTs hip to get adequate leaning) Sit<>supine transfers x 3 reps with verbal and tactile cues on using R LE to facilitate movement of L LE to lift on/off bed, cues for L UE/shoulder mgmt to initiate rolling.  Patient was able to perform with min to mod A with practice. Rolling  supine<>bilateral sidelying with min to mod A and cues on L UE support Sit<>stand with weight bearing through UEs on elevated mat table with + 2 assist standing x 2-3 minutes x 2 reps with tactile facilitation at L ischial tuberosity and L knee to block movement.  THERAPEUTIC EXERCISE: Supine heel slides with assist using sliding board + pillow case to improve ease Supine hip ab/adduction with assist (as above with sliding board) Pelvic tilts supine x 5 reps Hip ab/adduction supine with assist to adduct against gravity in hooklying Attempted sidelying clam shell, patient unable to lift against gravity and used trunk rotation to perform Seated trunk lean R and L resting on elbow with PT assist with L UE placement Seated physioball reaching with R/L weight shift and anterior ball rolling       PT Short Term Goals - 02/06/15 0739    PT SHORT TERM GOAL #1   Title The patient will perform home program with assist from family members.   Baseline Target date 03/08/2015   Time 4   Period Weeks   PT SHORT TERM GOAL #2   Title The patient will move sit<>supine with mod assist of 1 person (baseline +2 total assist).   Baseline Target date 03/08/2015   Time 4   Period Weeks   PT SHORT TERM GOAL #3   Title The patient will transfer with sliding board  with mod assist of 1 person (baseline +2 total assist).   Baseline Target date 03/08/2015   Time 4   Period Weeks   PT SHORT TERM GOAL #4   Title The patient will self propel manual wheelchair with minimal verbal cues.   Baseline Target date 03/08/2015   Time 4   Period Weeks   PT SHORT TERM GOAL #5   Title The patient will maintain standing with +2 assist x 2 minutes for improved weight bearing.   Baseline Target date 03/08/2015   Time 4   Period Weeks           PT Long Term Goals - 02/06/15 0745    PT LONG TERM GOAL #1   Title The patient/family will verbalize understanding of CVA warning signs/risk factors.   Baseline Target date  04/08/2015   Time 8   Period Weeks   PT LONG TERM GOAL #2   Title The patient will perform sliding board transfers with min A of 1 person w/c<>mat.   Baseline Target date 04/08/2015   Time 8   Period Weeks   PT LONG TERM GOAL #3   Title The patient will perform sit<>supine with min A of 1 person.   Baseline Target date 04/08/2015   Time 8   Period Weeks   PT LONG TERM GOAL #4   Title The patient will move sit<>stand with mod A of 1 person.     Baseline Target date 04/08/2015   Time 8   Period Weeks   PT LONG TERM GOAL #5   Title Ambulation goal to follow if patient progresses to gait activities.   Baseline Target date 04/08/2015   Time 8   Period Weeks               Plan - 02/13/15 0719    Clinical Impression Statement The patient tolerated greater length of time in weight bearing today.  She needs continued work on head/hips relationship to improve her ability to perform sliding board transfers prior to beginning sliding board transfers in the home.  Patient improved significantly t/o session on bed mobility with tactile and verbal cues.   PT Next Visit Plan sliding board transfers/ head hips relationship, bed mobility, standing? may try steps in parallel bars, standing at countertop for home, L LE strength, ? NMES for quad activation.   Consulted and Agree with Plan of Care Patient;Family member/caregiver   Family Member Consulted daughter and granddaughter        Problem List Patient Active Problem List   Diagnosis Date Noted  . Hemorrhage, intracerebral 02/03/2015  . Hemiparesis 02/03/2015  . Spastic hemiplegia affecting nondominant side 02/03/2015    Geremy Rister, PT 02/13/2015, 7:23 AM  [facility] [place] [address] [place], [place], [address] Phone: [phone]   Fax:  [phone]

## 2015-02-16 ENCOUNTER — Telehealth: Payer: Self-pay | Admitting: Neurology

## 2015-02-16 NOTE — Telephone Encounter (Signed)
Spoke with Michelle Kemp and told her Dr. Lucia Gaskins was out all last week and she has at least 14 days to complete forms. She is wondering if there are other transportation options that have medical personal. Told her I will check into this for her. She verbalized understanding.

## 2015-02-16 NOTE — Telephone Encounter (Signed)
[name] [name] is calling to ask about the form for transportation to ask if complete.  Please call.  Thanks

## 2015-02-17 ENCOUNTER — Ambulatory Visit: Payer: Self-pay | Admitting: Physical Therapy

## 2015-02-17 ENCOUNTER — Encounter: Payer: Self-pay | Admitting: Occupational Therapy

## 2015-02-17 ENCOUNTER — Ambulatory Visit: Payer: Self-pay | Admitting: Occupational Therapy

## 2015-02-17 DIAGNOSIS — R293 Abnormal posture: Secondary | ICD-10-CM

## 2015-02-17 DIAGNOSIS — M6281 Muscle weakness (generalized): Secondary | ICD-10-CM

## 2015-02-17 DIAGNOSIS — G8110 Spastic hemiplegia affecting unspecified side: Secondary | ICD-10-CM

## 2015-02-17 DIAGNOSIS — M25512 Pain in left shoulder: Secondary | ICD-10-CM

## 2015-02-17 DIAGNOSIS — M25542 Pain in joints of left hand: Secondary | ICD-10-CM

## 2015-02-17 DIAGNOSIS — R269 Unspecified abnormalities of gait and mobility: Secondary | ICD-10-CM

## 2015-02-17 DIAGNOSIS — M25612 Stiffness of left shoulder, not elsewhere classified: Secondary | ICD-10-CM

## 2015-02-17 DIAGNOSIS — R2681 Unsteadiness on feet: Secondary | ICD-10-CM

## 2015-02-17 NOTE — Therapy (Signed)
[facility] [address] [place], [place], [address] Phone: [phone]   Fax:  [phone]  Occupational Therapy Evaluation  Patient Details  Name: Michelle Kemp MRN: [id number] Date of Birth: 1941-01-23 Referring Provider:  Jackie Plum, MD  Encounter Date: 02/17/2015      OT End of Session - 02/17/15 1753    Visit Number 1   Number of Visits 17   Date for OT Re-Evaluation 04/16/15   Authorization Type self pay   OT Start Time 1530   OT Stop Time 1625   OT Time Calculation (min) 55 min   Activity Tolerance Patient tolerated treatment well   Behavior During Therapy [facility] for tasks assessed/performed      Past Medical History  Diagnosis Date  . Stroke   . Arthritis     Past Surgical History  Procedure Laterality Date  . Hernia repair  2001    There were no vitals filed for this visit.  Visit Diagnosis:  Spastic hemiplegia affecting nondominant side - Plan: Ot plan of care cert/re-cert  Stiffness of shoulder joint, left - Plan: Ot plan of care cert/re-cert  Pain in joint, shoulder region, left - Plan: Ot plan of care cert/re-cert  Joint pain in fingers of left hand - Plan: Ot plan of care cert/re-cert  Unsteadiness on feet - Plan: Ot plan of care cert/re-cert  Postural instability - Plan: Ot plan of care cert/re-cert      Subjective Assessment - 02/17/15 1542    Subjective  I don't want anyone to have to help me   Patient is accompained by: Family member  grand daughter   Currently in Pain? No/denies  No pain currently, but has consistent report of pain in left arm   Pain Location Shoulder   Pain Orientation Left   Pain Descriptors / Indicators Throbbing   Pain Type Chronic pain   Pain Radiating Towards from fingers to shoulder   Pain Onset More than a month ago   Pain Frequency Intermittent   Aggravating Factors  movement   Pain Relieving Factors medication, emu cream / massage    Effect of Pain on Daily Activities limits motion activity in shoulder           [facility] OT Assessment - 02/17/15 0001    Balance Screen   Has the patient fallen in the past 6 months No   Has the patient had a decrease in activity level because of a fear of falling?  Yes   Home  Environment   Living Arrangements Children   Available Help at Discharge Available 24 hours/day   Type of Home House   Home Access Level entry   Bathroom Shower/Tub Walk-in Shower   Shower/tub characteristics Curtain   Bathroom Toilet Standard   Bathroom Accessibility Yes   How accessible Accessible via wheelchair  rolling shower / commode chair   Lives With Daughter   Prior Function   Level of Independence Independent with basic ADLs;Independent with homemaking with ambulation   Leisure cook, watch movies, dance   ADL   Eating/Feeding Set up   Where assessed - Eating/feeding Wheelchair   Grooming Minimal assistance   Where Assess - Grooming Supported standing   Upper Body Bathing Minimal assistance   Where Assesed Upper Body Bathing Unsupported standing   Lower Body Bathing + 1 Total assistance   Where Assesed-Lower Body Bathing Supine,  head of bed flat   Upper Body Dressing Moderate assistance   Where Assessed-Upper Body  Dressing Unsupported sitting   Lower Body Dressing +1 Total aassistance   Where Assessd - Lower Body Dressing Supine, head of bed flat   Toilet Tranfer + 1 Total assistance;+ 2 Total assistance  hoyer lift   Toilet Transfer Method Other (comment)   Camera operator Other (comment)  rolling shower chair/commode   Toileting - Clothing Manipulation + 1 Total assistance   Toileting -  Hygiene + 1 Total assistance;+ 2 Total assostance   Tub/Shower Transfer + 1 Total assistance   Tub/Shower Transfer Equipment Other (comment)  rolling shower chair, level entry shower   ADL comments Hoyer lift for transfers, patient unable to stand at home.  Patient eager for increased  independence   IADL   Prior Level of Function Light Housekeeping independent   Light Housekeeping Does not participate in any housekeeping tasks;Needs help with all home maintenance tasks   Prior Level of Function Meal Prep independent   Meal Prep Needs to have meals prepared and served   Prior Level of Function Warehouse manager on family or friends for transportation   Written Expression   Dominant Hand Right   Vision - History   Baseline Vision No visual deficits   Vision Assessment   Eye Alignment Within Functional Limits   Ocular Range of Motion Within Functional Limits   Alignment/Gaze Preference Within Defined Limits   Tracking/Visual Pursuits Able to track stimulus in all quads without difficulty   Saccades Within functional limits   Comment Patient reports no change in vision with recent stroke.  Patien tindicates left eye occasionally tears, however this was an issue even prior to recent event   Activity Tolerance   Activity Tolerance Tolerates 10-20 min activity with muiltiple rests   Activity Tolerance Comments Patient becomes short of breath with mention of standing task during evaluation.  Patient able to verbalize that she has a fear of falling   Cognition   Overall Cognitive Status Within Functional Limits for tasks assessed   Attention Sustained;Selective  very attentive and able to easily switch from vis / verb cue   Sustained Attention Appears intact   Selective Attention Impaired   Selective Attention Impairment Functional basic;Verbal basic   Behaviors Other (comment)  sustained eye contact, very engaged in eval process   Observation/Other Assessments   Focus on Therapeutic Outcomes (FOTO)  not completed - language barrier   Sensation   Light Touch Appears Intact   Stereognosis Not tested   Hot/Cold Not tested   Additional Comments May need to asses proprioceptive awareness in future sessions   Coordination   Gross  Motor Movements are Fluid and Coordinated No   Fine Motor Movements are Fluid and Coordinated No   Coordination and Movement Description patient with dense hemiplegia in left upper extremity.     Finger Nose Finger Test unable   9 Hole Peg Test --   Other Unable to complere formal testing due to hemiplegia   Perception   Perception Impaired   Spatial Orientation fearful of forward weight shift - strong bias backward   Praxis   Praxis Not tested   Edema   Edema Left hand mildly edematous - maintained in fairly dependent position in bucket sling.    Tone   Assessment Location Left Upper Extremity   ROM / Strength   AROM / PROM / Strength PROM   PROM   Overall PROM  Deficits   Overall PROM Comments In sitting. patient limited to ~70  degrees of shoulder flexion before report of pain.  Patient with inferior subluxation left GH joint   PROM Assessment Site Shoulder;Elbow;Forearm;Wrist;Finger   Right/Left Shoulder Left   Left Shoulder Flexion 70 Degrees   Left Shoulder ABduction 60 Degrees   Left Shoulder External Rotation 30 Degrees   Right/Left Elbow Left   Left Elbow Flexion 130   Left Elbow Extension 160   Right/Left Forearm Left   Left Forearm Supination 45 Degrees   Right/Left Wrist Left   Strength   Overall Strength Deficits  not tested due to hyper/hypotonicity   Hand Function   Right Hand Gross Grasp Functional   Right Hand Grip (lbs) --  unable   Left Hand Gross Grasp Impaired   LUE Tone   LUE Tone Moderate;Modified Ashworth   LUE Tone   Modified Ashworth Scale for Grading Hypertonia LUE More marked increase in muscle tone through most of the ROM, but affected part(s) easily moved                         OT Education - 02/17/15 1753    Education provided Yes   Education Details PROM Left elbow   Person(s) Educated Patient;Caregiver(s)   Methods Explanation;Demonstration;Verbal cues;Tactile cues   Comprehension Returned demonstration           OT Short Term Goals - 02/17/15 1803    OT SHORT TERM GOAL #1   Title Patient will don pull over shirt without assistance (goals due 03/19/15)   Baseline mod assist   Time 4   Period Weeks   Status New   OT SHORT TERM GOAL #2   Title Patient will transition from sit to stand with mod assist as a precursos to eventually allow family to adjust clothing prior to and following toileting   Baseline Dependent    Time 4   Period Weeks   Status New   OT SHORT TERM GOAL #3   Title Patient will report pain no greater than 2-3/10 after positioning in bed by caregivers to allow her to sleep restfully   Baseline Pain disrupts sleep routinely  - pain 10/10 at times   Time 4   Period Weeks   Status New   OT SHORT TERM GOAL #4   Title Patient will reach forward toward lower legs and feet and sustaing position for 10 seconds to aide with lower body bathing and dressing.   Baseline Patient unable to reach forward and therefore is dressed in bed - dependent   Time 4   Period Weeks   Status New   OT SHORT TERM GOAL #5   Title Patient will demonstrate active grasp and release  in left hand with minimal facilitation, to aide with meal prep tasks as a precursor to simple cooking task   Baseline Unable to participate in cooking - dependent upon family   Time 4   Period Weeks   Status New           OT Long Term Goals - 02/17/15 1812    OT LONG TERM GOAL #1   Title Patient will don lower body clothing with moderate assistance while seated, adn transition to standing to pull up clothing (goals due 10/7)   Baseline Dependent - 1-2 caregivers   Time 8   Period Weeks   Status New   OT LONG TERM GOAL #2   Title Patient will transfer to commode with moderate assistance for toileting   Baseline dependent  Time 8   Period Weeks   Status New   OT LONG TERM GOAL #3   Title Patient will stand with no greater than min assist to allow family to complete toilet hygiene    Baseline dependent   Time 8    Period Weeks   Status New   OT LONG TERM GOAL #4   Title Patient will be able to reposition left arm for comfort in upright and reclined positions - such that pain experienced is no greater than 2/10   Baseline Pain can be 10/10 - patient unable to reposition left arm - dependent on caregivers   Time 8   Period Weeks   Status New   OT LONG TERM GOAL #5   Title Patient will prepare simple meal with moderate assistance from family members   Baseline dependent - not able to participate   Time 8   Period Weeks   Status New               Plan - 02/17/15 1754    Clinical Impression Statement Patient is a 74 year old previously healthy woman who suffered a hemorrhagic stroke while visiting her family in [place] in April.  Patient has travelled back to the [place] to stay with her daughter and family who currently provide 24 hour care.  Patient is completely dependent with all ADL/IADL since recent stroke, and caregiver burden is great, requiring lift equipment and 1-2 caregivers for basic ADL's and functional mobility.  Patient was independent prior to this stroke, and hopes to regain independence with her self care and cooking abilities.     Pt will benefit from skilled therapeutic intervention in order to improve on the following deficits (Retired) Decreased activity tolerance;Decreased balance;Decreased coordination;Decreased endurance;Impaired flexibility;Impaired perceived functional ability;Decreased safety awareness;Decreased range of motion;Decreased mobility;Decreased knowledge of use of DME;Decreased knowledge of precautions;Decreased strength;Increased edema;Increased muscle spasms;Impaired sensation;Pain;Impaired tone;Impaired UE functional use;Impaired vision/preception   Rehab Potential Good   Clinical Impairments Affecting Rehab Potential limited financial resources   OT Frequency 2x / week   OT Duration 8 weeks   OT Treatment/Interventions Self-care/ADL training;Electrical  Stimulation;Cryotherapy;Moist Heat;Visual/perceptual remediation/compensation;Patient/family education;Balance training;Splinting;Therapeutic exercises;Therapeutic activities;Neuromuscular education;Functional Mobility Training;Fluidtherapy;Biofeedback;DME and/or AE instruction   Plan Need to establish a home range of motion program for family to do with patient- her arm is painful and she is guarded - needs gentle mobility, NMR left upper extremity to address awareness of active movement throughout, postural control and sit to stand, sit to squat  transitions   OT Home Exercise Plan initiated on eval with PROM to left elbow   Consulted and Agree with Plan of Care Patient;Family member/caregiver        Problem List Patient Active Problem List   Diagnosis Date Noted  . Hemorrhage, intracerebral 02/03/2015  . Hemiparesis 02/03/2015  . Spastic hemiplegia affecting nondominant side 02/03/2015    Collier Salina, OTR/L 02/17/2015, 6:26 PM  [facility] [address] [place], [place], [address] Phone: [phone]   Fax:  [phone]

## 2015-02-18 NOTE — Therapy (Signed)
[facility] [address] [place], [place], [address] Phone: [phone]   Fax:  [phone]  Physical Therapy Treatment  Patient Details  Name: Michelle Kemp MRN: [id number] Date of Birth: 01/19/1941 Referring Provider:  Jackie Plum, MD  Encounter Date: 02/17/2015      PT End of Session - 02/17/15 2007    Visit Number 4   Number of Visits 16   Date for PT Re-Evaluation 04/08/15   Authorization Type Self pay, no eligibility to apply for benefits per daughter's reports   PT Start Time 1445   PT Stop Time 1530   PT Time Calculation (min) 45 min   Equipment Utilized During Treatment Gait belt   Activity Tolerance Patient tolerated treatment well   Behavior During Therapy [facility] for tasks assessed/performed      Past Medical History  Diagnosis Date  . Stroke   . Arthritis     Past Surgical History  Procedure Laterality Date  . Hernia repair  2001    There were no vitals filed for this visit.  Visit Diagnosis:  Muscle weakness  Abnormality of gait  Spastic hemiplegia affecting nondominant side  Treatment: Min to mod assist of 2 people for slide board transfer wheelchair to mat table.  With max multimodal cues worked on lateral scooting along mat table and also on reciprocal forward/backward scooting in unsupported sitting. Assist/facilitation needed for desired movement and body positioning with scooting.  Mod assist to lie down toward weak side into hook lying.  NMES with empi unit, large muscle, intensity to tolerance x 15 minutes concurrent with short arc quads. Cues on full knee extension.         PT Short Term Goals - 02/06/15 0739    PT SHORT TERM GOAL #1   Title The patient will perform home program with assist from family members.   Baseline Target date 03/08/2015   Time 4   Period Weeks   PT SHORT TERM GOAL #2   Title The patient will move sit<>supine with mod assist of 1 person  (baseline +2 total assist).   Baseline Target date 03/08/2015   Time 4   Period Weeks   PT SHORT TERM GOAL #3   Title The patient will transfer with sliding board with mod assist of 1 person (baseline +2 total assist).   Baseline Target date 03/08/2015   Time 4   Period Weeks   PT SHORT TERM GOAL #4   Title The patient will self propel manual wheelchair with minimal verbal cues.   Baseline Target date 03/08/2015   Time 4   Period Weeks   PT SHORT TERM GOAL #5   Title The patient will maintain standing with +2 assist x 2 minutes for improved weight bearing.   Baseline Target date 03/08/2015   Time 4   Period Weeks           PT Long Term Goals - 02/06/15 0745    PT LONG TERM GOAL #1   Title The patient/family will verbalize understanding of CVA warning signs/risk factors.   Baseline Target date 04/08/2015   Time 8   Period Weeks   PT LONG TERM GOAL #2   Title The patient will perform sliding board transfers with min A of 1 person w/c<>mat.   Baseline Target date 04/08/2015   Time 8   Period Weeks   PT LONG TERM GOAL #3   Title The patient will perform sit<>supine with min A of 1  person.   Baseline Target date 04/08/2015   Time 8   Period Weeks   PT LONG TERM GOAL #4   Title The patient will move sit<>stand with mod A of 1 person.     Baseline Target date 04/08/2015   Time 8   Period Weeks   PT LONG TERM GOAL #5   Title Ambulation goal to follow if patient progresses to gait activities.   Baseline Target date 04/08/2015   Time 8   Period Weeks           Plan - 02/18/15 2008    Clinical Impression Statement Pt continues to make steady progress toward goals with less assist needed for slide board transfers toward her weak side and with increased muscle activation in her weak leg noted today with NMES.   Pt will benefit from skilled therapeutic intervention in order to improve on the following deficits Abnormal gait;Decreased balance;Decreased mobility;Decreased  strength;Postural dysfunction;Impaired flexibility;Pain;Decreased activity tolerance;Decreased endurance;Increased muscle spasms;Difficulty walking   Rehab Potential Good   PT Frequency 2x / week   PT Duration 8 weeks   PT Treatment/Interventions Electrical Stimulation;Gait training;Stair training;Neuromuscular re-education;Balance training;Therapeutic exercise;Therapeutic activities;Functional mobility training;Patient/family education;Wheelchair mobility training;Manual techniques;Orthotic Fit/Training   PT Next Visit Plan continue to work on slide board transfers, bed mobility and left leg strengthening, NMES to quad and possible anterior tibialis concurent with ther ex.    Consulted and Agree with Plan of Care Patient;Family member/caregiver   Family Member Consulted granddaughter        Problem List Patient Active Problem List   Diagnosis Date Noted  . Hemorrhage, intracerebral 02/03/2015  . Hemiparesis 02/03/2015  . Spastic hemiplegia affecting nondominant side 02/03/2015    Sallyanne Kuster 02/18/2015, 8:10 PM  Sallyanne Kuster, PTA, [facility] [address] [place], [place] [address] [phone] 02/18/2015, 8:10 PM

## 2015-02-19 ENCOUNTER — Ambulatory Visit: Payer: Self-pay | Admitting: Occupational Therapy

## 2015-02-19 ENCOUNTER — Encounter: Payer: Self-pay | Admitting: Occupational Therapy

## 2015-02-19 ENCOUNTER — Ambulatory Visit: Payer: Self-pay | Admitting: Rehabilitative and Restorative Service Providers"

## 2015-02-19 DIAGNOSIS — M6281 Muscle weakness (generalized): Secondary | ICD-10-CM

## 2015-02-19 DIAGNOSIS — M25612 Stiffness of left shoulder, not elsewhere classified: Secondary | ICD-10-CM

## 2015-02-19 DIAGNOSIS — R293 Abnormal posture: Secondary | ICD-10-CM

## 2015-02-19 DIAGNOSIS — R269 Unspecified abnormalities of gait and mobility: Secondary | ICD-10-CM

## 2015-02-19 DIAGNOSIS — R2681 Unsteadiness on feet: Secondary | ICD-10-CM

## 2015-02-19 NOTE — Therapy (Signed)
[facility] [address] [place], [place], [address] Phone: [phone]   Fax:  [phone]  Occupational Therapy Treatment  Patient Details  Name: Michelle Kemp MRN: [id number] Date of Birth: 06/26/1941 Referring Provider:  Jackie Plum, MD  Encounter Date: 02/19/2015      OT End of Session - 02/19/15 1354    Visit Number 2   Number of Visits 17   Date for OT Re-Evaluation 04/16/15   Authorization Type self pay   OT Start Time 1233   OT Stop Time 1317   OT Time Calculation (min) 44 min   Equipment Utilized During Treatment UE Ranger   Activity Tolerance Patient tolerated treatment well   Behavior During Therapy [facility] for tasks assessed/performed      Past Medical History  Diagnosis Date  . Stroke   . Arthritis     Past Surgical History  Procedure Laterality Date  . Hernia repair  2001    There were no vitals filed for this visit.  Visit Diagnosis:  Stiffness of shoulder joint, left  Unsteadiness on feet  Postural instability      Subjective Assessment - 02/19/15 1338    Subjective  My knee is hurting today   Patient is accompained by: Family member   Currently in Pain? Yes   Pain Score 3    Pain Location Knee   Pain Orientation Left   Pain Descriptors / Indicators Aching   Pain Type Chronic pain   Pain Onset More than a month ago   Pain Frequency Intermittent   Aggravating Factors  movement, weight bearing   Pain Relieving Factors rest   Multiple Pain Sites No                      OT Treatments/Exercises (OP) - 02/19/15 0001    Neurological Re-education Exercises   Other Exercises 1 Neuromuscular reeducation with emphasis on postural control, midline orientation  in sitting.  Patient without report of shoulder pain today.  Patient with strong bias to right and posterior in sitting.  With overcorrection forward and to left, able to facilitate left shoulder movement  adduction, and extension.  Patient needs cueing and physical facilitation to decrease extension in head/neck and to foster visual attention to left arm during training.  In supine worked to isolate elbow flexion and extension .  In sitting worked to grasp 1" blocks - patient has movement present in flexor synergy pattern.    Other Exercises 2 Sit to squat and pivoting left/right.  Patient with insufficient weight translation forward to allow her to effectively lift bottom from surface.  With facilitation of forward weight shift in trunk in midline, patient able to transition to near stand.  Patient unable to achieve erect stand though and sustains hip and knee flexion.  Patient indicates "I feel like I should just be able to get up.  My body tells me to get up, but I am afraid I will fall."  Reinforced for patient that right now any mobility would require a helper  to ensure her safety.                  OT Education - 02/19/15 1353    Education provided Yes   Education Details Seated at table top, with forearm resting on table, practice grasp AND release.  Patient can turn on finger flexors, and has difficuty releasing.  Does best with actual object    Person(s) Educated Patient;Caregiver(s)  grand daughter   Methods Explanation;Demonstration;Tactile cues;Verbal cues   Comprehension Verbalized understanding          OT Short Term Goals - 02/17/15 1803    OT SHORT TERM GOAL #1   Title Patient will don pull over shirt without assistance (goals due 03/19/15)   Baseline mod assist   Time 4   Period Weeks   Status New   OT SHORT TERM GOAL #2   Title Patient will transition from sit to stand with mod assist as a precursos to eventually allow family to adjust clothing prior to and following toileting   Baseline Dependent    Time 4   Period Weeks   Status New   OT SHORT TERM GOAL #3   Title Patient will report pain no greater than 2-3/10 after positioning in bed by caregivers to allow  her to sleep restfully   Baseline Pain disrupts sleep routinely  - pain 10/10 at times   Time 4   Period Weeks   Status New   OT SHORT TERM GOAL #4   Title Patient will reach forward toward lower legs and feet and sustaing position for 10 seconds to aide with lower body bathing and dressing.   Baseline Patient unable to reach forward and therefore is dressed in bed - dependent   Time 4   Period Weeks   Status New   OT SHORT TERM GOAL #5   Title Patient will demonstrate active grasp and release  in left hand with minimal facilitation, to aide with meal prep tasks as a precursor to simple cooking task   Baseline Unable to participate in cooking - dependent upon family   Time 4   Period Weeks   Status New           OT Long Term Goals - 02/17/15 1812    OT LONG TERM GOAL #1   Title Patient will don lower body clothing with moderate assistance while seated, adn transition to standing to pull up clothing (goals due 10/7)   Baseline Dependent - 1-2 caregivers   Time 8   Period Weeks   Status New   OT LONG TERM GOAL #2   Title Patient will transfer to commode with moderate assistance for toileting   Baseline dependent   Time 8   Period Weeks   Status New   OT LONG TERM GOAL #3   Title Patient will stand with no greater than min assist to allow family to complete toilet hygiene    Baseline dependent   Time 8   Period Weeks   Status New   OT LONG TERM GOAL #4   Title Patient will be able to reposition left arm for comfort in upright and reclined positions - such that pain experienced is no greater than 2/10   Baseline Pain can be 10/10 - patient unable to reposition left arm - dependent on caregivers   Time 8   Period Weeks   Status New   OT LONG TERM GOAL #5   Title Patient will prepare simple meal with moderate assistance from family members   Baseline dependent - not able to participate   Time 8   Period Weeks   Status New               Plan - 02/19/15 1355     Clinical Impression Statement Patient reports significant decrease in arm pain today.  Patient is beginnign to show active movement in left shoulder, elbow, forearm and  hand.  Patient is eager for increased independence,a dn will benefit from skilled OT intervention to increase participation in ADL, improve use of left arm, decrease pain in left arm, and decrease overall level of assistance for ADL/IADL   Pt will benefit from skilled therapeutic intervention in order to improve on the following deficits (Retired) Decreased activity tolerance;Decreased balance;Decreased coordination;Decreased endurance;Impaired flexibility;Impaired perceived functional ability;Decreased safety awareness;Decreased range of motion;Decreased mobility;Decreased knowledge of use of DME;Decreased knowledge of precautions;Decreased strength;Increased edema;Increased muscle spasms;Impaired sensation;Pain;Impaired tone;Impaired UE functional use;Impaired vision/preception   Rehab Potential Good   Clinical Impairments Affecting Rehab Potential limited financial resources   OT Frequency 2x / week   OT Duration 8 weeks   OT Treatment/Interventions Self-care/ADL training;Electrical Stimulation;Cryotherapy;Moist Heat;Visual/perceptual remediation/compensation;Patient/family education;Balance training;Splinting;Therapeutic exercises;Therapeutic activities;Neuromuscular education;Functional Mobility Training;Fluidtherapy;Biofeedback;DME and/or AE instruction   Plan Need to establish HEP to prevent further shoulder pain.  NMR to left UE, postural control, and functional mobility   OT Home Exercise Plan initiated on eval with PROM to left elbow   Consulted and Agree with Plan of Care Patient;Family member/caregiver        Problem List Patient Active Problem List   Diagnosis Date Noted  . Hemorrhage, intracerebral 02/03/2015  . Hemiparesis 02/03/2015  . Spastic hemiplegia affecting nondominant side 02/03/2015    Collier Salina, OTR/L 02/19/2015, 2:01 PM  [facility] [address] [place], [place], [address] Phone: [phone]   Fax:  [phone]

## 2015-02-19 NOTE — Therapy (Signed)
[facility] [address] [place], [place], [address] Phone: [phone]   Fax:  [phone]  Physical Therapy Treatment  Patient Details  Name: Michelle Kemp MRN: [id number] Date of Birth: 11-26-1940 Referring Provider:  Jackie Plum, MD  Encounter Date: 02/19/2015      PT End of Session - 02/19/15 1552    Visit Number 5   Number of Visits 16   Date for PT Re-Evaluation 04/08/15   Authorization Type Self pay, no eligibility to apply for benefits per daughter's reports   PT Start Time 1325   PT Stop Time 1405   PT Time Calculation (min) 40 min   Equipment Utilized During Treatment Gait belt   Activity Tolerance Patient tolerated treatment well   Behavior During Therapy [facility] for tasks assessed/performed      Past Medical History  Diagnosis Date  . Stroke   . Arthritis     Past Surgical History  Procedure Laterality Date  . Hernia repair  2001    There were no vitals filed for this visit.  Visit Diagnosis:  Muscle weakness  Postural instability  Abnormality of gait      Subjective Assessment - 02/19/15 1550    Subjective The patient has increased L knee pain today.  Her daughter reports that the private pay for a ride to therapy is very expensive and she is uncertain if she can continue to pay.   Patient is accompained by: Family member;Interpreter   Pertinent History daughter and granddaughter/ daughter interprets   Patient Stated Goals Improve mobility and endurance   Currently in Pain? Yes   Pain Score 3    Pain Location Knee   Pain Orientation Left   Pain Descriptors / Indicators Aching   Pain Type Chronic pain   Pain Onset More than a month ago   Pain Frequency Intermittent   Aggravating Factors  movement, weight bearing   Pain Relieving Factors rest      THERAPEUTIC ACTIVITIES: Patient in supine upon beginning PT (after OT).  PT worked on rolling supine to bilateral sidelying with  tactile cues for management of the L shoulder/UE and CGA rolling to the left and min A rolling to the right. R sidelying to sitting with min A with verbal cues on LE mgmt Seated scooting along edge of mat anterior/posterior directions and lateral trunk leaning Sliding board transfer mat>w/c with mod A of 1 person to the left side with cues on head/hips relationship Sitting on elevated mat working on shifting weight R<>L and then transitioning to stand trying: With RW with PT supporting L UE on walker (not able to come to upright) Standing with R LBQC and PT providing L knee blocking and cues L side (not able to come to upright) Standing with UEs supported on elevated mat table *able to come to stand  [name] was second person assist today during attempted standing tasks for support/safety   THERAPEUTIC EXERCISE: Seated L knee flexion/extension along powder board with visual and tactile cues L knee to chest in supine with patient performing A/AROM P/ROM L hamstring stretch and hip IR/ER      PT Short Term Goals - 02/06/15 0739    PT SHORT TERM GOAL #1   Title The patient will perform home program with assist from family members.   Baseline Target date 03/08/2015   Time 4   Period Weeks   PT SHORT TERM GOAL #2   Title The patient will move sit<>supine with  mod assist of 1 person (baseline +2 total assist).   Baseline Target date 03/08/2015   Time 4   Period Weeks   PT SHORT TERM GOAL #3   Title The patient will transfer with sliding board with mod assist of 1 person (baseline +2 total assist).   Baseline Target date 03/08/2015   Time 4   Period Weeks   PT SHORT TERM GOAL #4   Title The patient will self propel manual wheelchair with minimal verbal cues.   Baseline Target date 03/08/2015   Time 4   Period Weeks   PT SHORT TERM GOAL #5   Title The patient will maintain standing with +2 assist x 2 minutes for improved weight bearing.   Baseline Target date 03/08/2015   Time  4   Period Weeks           PT Long Term Goals - 02/06/15 0745    PT LONG TERM GOAL #1   Title The patient/family will verbalize understanding of CVA warning signs/risk factors.   Baseline Target date 04/08/2015   Time 8   Period Weeks   PT LONG TERM GOAL #2   Title The patient will perform sliding board transfers with min A of 1 person w/c<>mat.   Baseline Target date 04/08/2015   Time 8   Period Weeks   PT LONG TERM GOAL #3   Title The patient will perform sit<>supine with min A of 1 person.   Baseline Target date 04/08/2015   Time 8   Period Weeks   PT LONG TERM GOAL #4   Title The patient will move sit<>stand with mod A of 1 person.     Baseline Target date 04/08/2015   Time 8   Period Weeks   PT LONG TERM GOAL #5   Title Ambulation goal to follow if patient progresses to gait activities.   Baseline Target date 04/08/2015   Time 8   Period Weeks               Plan - 02/19/15 1552    Clinical Impression Statement The patient is making progress with bed mobility and transfers.  She continues to need +2 assist for attempted standing to increase weight bearing through the L LE.  She had increased pain today in L knee, PT to monitor.   PT Next Visit Plan continue to work on slide board transfers, bed mobility and left leg strengthening, NMES to quad and possible anterior tibialis concurent with ther ex.    Consulted and Agree with Plan of Care Patient;Family member/caregiver   Family Member Consulted granddaughter; daughter        Problem List Patient Active Problem List   Diagnosis Date Noted  . Hemorrhage, intracerebral 02/03/2015  . Hemiparesis 02/03/2015  . Spastic hemiplegia affecting nondominant side 02/03/2015    Alyaan Budzynski, PT 02/19/2015, 3:54 PM  [facility] [address] [place], [place], [address] Phone: [phone]   Fax:  [phone]

## 2015-02-22 ENCOUNTER — Encounter: Payer: Self-pay | Admitting: Occupational Therapy

## 2015-02-22 ENCOUNTER — Ambulatory Visit: Payer: Self-pay | Admitting: Occupational Therapy

## 2015-02-22 ENCOUNTER — Encounter: Payer: Self-pay | Admitting: Physical Therapy

## 2015-02-22 ENCOUNTER — Telehealth: Payer: Self-pay | Admitting: *Deleted

## 2015-02-22 ENCOUNTER — Ambulatory Visit: Payer: Self-pay | Admitting: Physical Therapy

## 2015-02-22 DIAGNOSIS — R2681 Unsteadiness on feet: Secondary | ICD-10-CM

## 2015-02-22 DIAGNOSIS — R269 Unspecified abnormalities of gait and mobility: Secondary | ICD-10-CM

## 2015-02-22 DIAGNOSIS — G8110 Spastic hemiplegia affecting unspecified side: Secondary | ICD-10-CM

## 2015-02-22 DIAGNOSIS — M6281 Muscle weakness (generalized): Secondary | ICD-10-CM

## 2015-02-22 DIAGNOSIS — R293 Abnormal posture: Secondary | ICD-10-CM

## 2015-02-22 NOTE — Telephone Encounter (Signed)
Patient form at the from desk.

## 2015-02-22 NOTE — Patient Instructions (Signed)
When getting ready to stand it is important to remember three things: 1)  You must lean forward (reach for the chair or bar in front of you) 2)  You must have both feet on the ground to stand and balance 3)  You must finish standing - "Stand up tall!"

## 2015-02-22 NOTE — Therapy (Signed)
[facility] [address] [place], [place], [address] Phone: [phone]   Fax:  [phone]  Occupational Therapy Treatment  Patient Details  Name: Michelle Kemp MRN: [id number] Date of Birth: May 28, 1941 Referring Provider:  Jackie Plum, MD  Encounter Date: 02/22/2015      OT End of Session - 02/22/15 1644    Activity Tolerance Patient tolerated treatment well   Behavior During Therapy [facility] for tasks assessed/performed      Past Medical History  Diagnosis Date  . Stroke   . Arthritis     Past Surgical History  Procedure Laterality Date  . Hernia repair  2001    There were no vitals filed for this visit.  Visit Diagnosis:  Unsteadiness on feet  Postural instability  Spastic hemiplegia affecting nondominant side      Subjective Assessment - 02/22/15 1630    Subjective  My knee is much better today   Patient is accompained by: Family member   Currently in Pain? No/denies   Pain Score 0-No pain                              OT Education - 02/22/15 1643    Education provided Yes   Education Details transfer training, sit to stand transitions and prerequisites   Person(s) Educated Patient;Caregiver(s)   Methods Explanation;Demonstration;Verbal cues;Tactile cues   Comprehension Verbalized understanding;Tactile cues required          OT Short Term Goals - 02/17/15 1803    OT SHORT TERM GOAL #1   Title Patient will don pull over shirt without assistance (goals due 03/19/15)   Baseline mod assist   Time 4   Period Weeks   Status New   OT SHORT TERM GOAL #2   Title Patient will transition from sit to stand with mod assist as a precursos to eventually allow family to adjust clothing prior to and following toileting   Baseline Dependent    Time 4   Period Weeks   Status New   OT SHORT TERM GOAL #3   Title Patient will report pain no greater than 2-3/10 after positioning  in bed by caregivers to allow her to sleep restfully   Baseline Pain disrupts sleep routinely  - pain 10/10 at times   Time 4   Period Weeks   Status New   OT SHORT TERM GOAL #4   Title Patient will reach forward toward lower legs and feet and sustaing position for 10 seconds to aide with lower body bathing and dressing.   Baseline Patient unable to reach forward and therefore is dressed in bed - dependent   Time 4   Period Weeks   Status New   OT SHORT TERM GOAL #5   Title Patient will demonstrate active grasp and release  in left hand with minimal facilitation, to aide with meal prep tasks as a precursor to simple cooking task   Baseline Unable to participate in cooking - dependent upon family   Time 4   Period Weeks   Status New           OT Long Term Goals - 02/17/15 1812    OT LONG TERM GOAL #1   Title Patient will don lower body clothing with moderate assistance while seated, adn transition to standing to pull up clothing (goals due 10/7)   Baseline Dependent - 1-2 caregivers   Time 8   Period  Weeks   Status New   OT LONG TERM GOAL #2   Title Patient will transfer to commode with moderate assistance for toileting   Baseline dependent   Time 8   Period Weeks   Status New   OT LONG TERM GOAL #3   Title Patient will stand with no greater than min assist to allow family to complete toilet hygiene    Baseline dependent   Time 8   Period Weeks   Status New   OT LONG TERM GOAL #4   Title Patient will be able to reposition left arm for comfort in upright and reclined positions - such that pain experienced is no greater than 2/10   Baseline Pain can be 10/10 - patient unable to reposition left arm - dependent on caregivers   Time 8   Period Weeks   Status New   OT LONG TERM GOAL #5   Title Patient will prepare simple meal with moderate assistance from family members   Baseline dependent - not able to participate   Time 8   Period Weeks   Status New                Plan - 02/22/15 1644    Clinical Impression Statement Patient is showing steady improvement with functional mobility which can lead to decreased caregiver burden for basic self care tasks.  Patient is highly motivated to improve her ability to move herself, yet quite fearful of falling.  Repetition and faciltiation of more effective motor patterns will improve overall ability to complete ADL.     Pt will benefit from skilled therapeutic intervention in order to improve on the following deficits (Retired) Decreased activity tolerance;Decreased balance;Decreased coordination;Decreased endurance;Impaired flexibility;Impaired perceived functional ability;Decreased safety awareness;Decreased range of motion;Decreased mobility;Decreased knowledge of use of DME;Decreased knowledge of precautions;Decreased strength;Increased edema;Increased muscle spasms;Impaired sensation;Pain;Impaired tone;Impaired UE functional use;Impaired vision/preception   Rehab Potential Good   Clinical Impairments Affecting Rehab Potential limited financial resources   OT Frequency 2x / week   OT Duration 8 weeks   OT Treatment/Interventions Self-care/ADL training;Electrical Stimulation;Cryotherapy;Moist Heat;Visual/perceptual remediation/compensation;Patient/family education;Balance training;Splinting;Therapeutic exercises;Therapeutic activities;Neuromuscular education;Functional Mobility Training;Fluidtherapy;Biofeedback;DME and/or AE instruction   Plan NMR to left UE, Need to establish HEP - supine and sitting, ADL - functional mobility and stand balance   Consulted and Agree with Plan of Care Patient;Family member/caregiver        Problem List Patient Active Problem List   Diagnosis Date Noted  . Hemorrhage, intracerebral 02/03/2015  . Hemiparesis 02/03/2015  . Spastic hemiplegia affecting nondominant side 02/03/2015    Collier Salina, OTR/L 02/23/2015, 9:38 AM  [facility] [address] [place], [place], [address] Phone: [phone]   Fax:  [phone]

## 2015-02-24 NOTE — Therapy (Signed)
[facility] [address] [place], [place], [address] Phone: [phone]   Fax:  [phone]  Physical Therapy Treatment  Patient Details  Name: Michelle Kemp MRN: [id number] Date of Birth: [birth year] Referring Provider:  Jackie Plum, MD  Encounter Date: 02/22/2015   02/22/15 1536  PT Visits / Re-Eval  Visit Number 6  Number of Visits 16  Date for PT Re-Evaluation 04/08/15  Authorization  Authorization Type Self pay, no eligibility to apply for benefits per daughter's reports  PT Time Calculation  PT Start Time 1531  PT Stop Time 1615  PT Time Calculation (min) 44 min  PT - End of Session  Equipment Utilized During Treatment Gait belt  Activity Tolerance Patient tolerated treatment well  Behavior During Therapy [facility] for tasks assessed/performed     Past Medical History  Diagnosis Date  . Stroke   . Arthritis     Past Surgical History  Procedure Laterality Date  . Hernia repair  2001    02/22/15 1536  Symptoms/Limitations  Subjective No new complaints. No falls.   Pain Assessment  Currently in Pain? No/denies  Pain Score 0    There were no vitals filed for this visit.  Visit Diagnosis:  Postural instability  Muscle weakness  Abnormality of gait  Unsteadiness on feet  Treatment: Ther-act max assist with cues for lateral transfer toward the left wheelchair>mat and mod assist with cues for lateral transfer right mat>wheelchair. Multimodal cues needed for technique and body positioning.  Sit<>stand: therapist seated in chair in front of pt, left knee blocked, pt using right hand on chair armrest and therapist using sheet facilitation at pelvis to assist pt with standing. Once standing with mod to max assist, attempted to work on achieving upright posture and pelvis in neutral position. Pt able to achieve upright posture, however with attempts to get pelvis in neural/not rotated toward right, pt  would loose balance laterally left and need to sit down for safety.  Neuro re-ed with exercises NMES to left quads/VMO concurrent with long arc quads, AAROM needed for full range of motion and cues/facilitaton for quad activitaion/ex technique x 10 minutes.         PT Short Term Goals - 02/06/15 0739    PT SHORT TERM GOAL #1   Title The patient will perform home program with assist from family members.   Baseline Target date 03/08/2015   Time 4   Period Weeks   PT SHORT TERM GOAL #2   Title The patient will move sit<>supine with mod assist of 1 person (baseline +2 total assist).   Baseline Target date 03/08/2015   Time 4   Period Weeks   PT SHORT TERM GOAL #3   Title The patient will transfer with sliding board with mod assist of 1 person (baseline +2 total assist).   Baseline Target date 03/08/2015   Time 4   Period Weeks   PT SHORT TERM GOAL #4   Title The patient will self propel manual wheelchair with minimal verbal cues.   Baseline Target date 03/08/2015   Time 4   Period Weeks   PT SHORT TERM GOAL #5   Title The patient will maintain standing with +2 assist x 2 minutes for improved weight bearing.   Baseline Target date 03/08/2015   Time 4   Period Weeks           PT Long Term Goals - 02/06/15 0745    PT LONG TERM GOAL #1  Title The patient/family will verbalize understanding of CVA warning signs/risk factors.   Baseline Target date 04/08/2015   Time 8   Period Weeks   PT LONG TERM GOAL #2   Title The patient will perform sliding board transfers with min A of 1 person w/c<>mat.   Baseline Target date 04/08/2015   Time 8   Period Weeks   PT LONG TERM GOAL #3   Title The patient will perform sit<>supine with min A of 1 person.   Baseline Target date 04/08/2015   Time 8   Period Weeks   PT LONG TERM GOAL #4   Title The patient will move sit<>stand with mod A of 1 person.     Baseline Target date 04/08/2015   Time 8   Period Weeks   PT LONG TERM GOAL #5    Title Ambulation goal to follow if patient progresses to gait activities.   Baseline Target date 04/08/2015   Time 8   Period Weeks        02/22/15 1536  Plan  Clinical Impression Statement Pt was able to stand with left knee blocked and bring her shoulders back into upright posture today. Limited quad activiation noted today with long arc quads concurrent with NMES. Pt making steady progress toward goals.                           Pt will benefit from skilled therapeutic intervention in order to improve on the following deficits Abnormal gait;Decreased balance;Decreased mobility;Decreased strength;Postural dysfunction;Impaired flexibility;Pain;Decreased activity tolerance;Decreased endurance;Increased muscle spasms;Difficulty walking  Rehab Potential Good  PT Frequency 2x / week  PT Duration 8 weeks  PT Treatment/Interventions Electrical Stimulation;Gait training;Stair training;Neuromuscular re-education;Balance training;Therapeutic exercise;Therapeutic activities;Functional mobility training;Patient/family education;Wheelchair mobility training;Manual techniques;Orthotic Fit/Training  PT Next Visit Plan continue to work on slide board transfers, bed mobility and left leg strengthening, NMES to quad and possible anterior tibialis concurent with ther ex.   Consulted and Agree with Plan of Care Patient;Family member/caregiver  Family Member Consulted granddaughter    Problem List Patient Active Problem List   Diagnosis Date Noted  . Hemorrhage, intracerebral 02/03/2015  . Hemiparesis 02/03/2015  . Spastic hemiplegia affecting nondominant side 02/03/2015    Sallyanne Kuster 02/24/2015, 6:52 PM  Sallyanne Kuster, PTA, [facility] [address] [place], [place] [address] [phone] 02/24/2015, 6:52 PM

## 2015-02-26 ENCOUNTER — Ambulatory Visit: Payer: Self-pay | Admitting: Occupational Therapy

## 2015-02-26 ENCOUNTER — Encounter: Payer: Self-pay | Admitting: Occupational Therapy

## 2015-02-26 ENCOUNTER — Ambulatory Visit: Payer: Self-pay | Admitting: Rehabilitative and Restorative Service Providers"

## 2015-02-26 DIAGNOSIS — G8110 Spastic hemiplegia affecting unspecified side: Secondary | ICD-10-CM

## 2015-02-26 DIAGNOSIS — R293 Abnormal posture: Secondary | ICD-10-CM

## 2015-02-26 DIAGNOSIS — M6281 Muscle weakness (generalized): Secondary | ICD-10-CM

## 2015-02-26 DIAGNOSIS — R2681 Unsteadiness on feet: Secondary | ICD-10-CM

## 2015-02-26 NOTE — Therapy (Signed)
[facility] [address] [place], [place], [address] Phone: [phone]   Fax:  [phone]  Physical Therapy Treatment  Patient Details  Name: Michelle Kemp MRN: [id number] Date of Birth: Apr 11, 1941 Referring Provider:  Jackie Plum, MD  Encounter Date: 02/26/2015      PT End of Session - 02/26/15 1417    Visit Number 7   Number of Visits 16   Date for PT Re-Evaluation 04/08/15   Authorization Type Self pay, no eligibility to apply for benefits per daughter's reports   PT Start Time 1315   PT Stop Time 1405   PT Time Calculation (min) 50 min   Equipment Utilized During Treatment Gait belt   Activity Tolerance Patient tolerated treatment well   Behavior During Therapy [facility] for tasks assessed/performed      Past Medical History  Diagnosis Date  . Stroke   . Arthritis     Past Surgical History  Procedure Laterality Date  . Hernia repair  2001    There were no vitals filed for this visit.  Visit Diagnosis:  Muscle weakness      Subjective Assessment - 02/26/15 1320    Subjective The patient stood at home with 3 person assist, however the patient was doing most of the movement.    Patient is accompained by: Family member;Interpreter   Pertinent History daughter and granddaughter/ daughter interprets   Patient Stated Goals Improve mobility and endurance   Currently in Pain? No/denies      THERAPEUTIC ACTIVITIES: Transfers emphasizing scooting and head/hips relationship to both sides with mod A initially with cues on leaning/weight shift Scooting along edge of mat *realized patient was wet and PT took to private room to change clothes.   PT transferred with patient mod A scooting transfer to the left side and max A returning to chair due to L LE in extension tone Rolling to the left with mod A Rolling to the right with dependent assist due to L leg flexion and UE mgmt Attempting to scoot in bed  with max assist and cues on bridge with L LE supported in flexion Sit<>stand to parallel bar to allow patient to pull up with blocking L knee due to buckling, maintaining standing x 3 minutes nonstop         PT Short Term Goals - 02/06/15 0739    PT SHORT TERM GOAL #1   Title The patient will perform home program with assist from family members.   Baseline Target date 03/08/2015   Time 4   Period Weeks   PT SHORT TERM GOAL #2   Title The patient will move sit<>supine with mod assist of 1 person (baseline +2 total assist).   Baseline Target date 03/08/2015   Time 4   Period Weeks   PT SHORT TERM GOAL #3   Title The patient will transfer with sliding board with mod assist of 1 person (baseline +2 total assist).   Baseline Target date 03/08/2015   Time 4   Period Weeks   PT SHORT TERM GOAL #4   Title The patient will self propel manual wheelchair with minimal verbal cues.   Baseline Target date 03/08/2015   Time 4   Period Weeks   PT SHORT TERM GOAL #5   Title The patient will maintain standing with +2 assist x 2 minutes for improved weight bearing.   Baseline Target date 03/08/2015   Time 4   Period Weeks  PT Long Term Goals - 02/06/15 0745    PT LONG TERM GOAL #1   Title The patient/family will verbalize understanding of CVA warning signs/risk factors.   Baseline Target date 04/08/2015   Time 8   Period Weeks   PT LONG TERM GOAL #2   Title The patient will perform sliding board transfers with min A of 1 person w/c<>mat.   Baseline Target date 04/08/2015   Time 8   Period Weeks   PT LONG TERM GOAL #3   Title The patient will perform sit<>supine with min A of 1 person.   Baseline Target date 04/08/2015   Time 8   Period Weeks   PT LONG TERM GOAL #4   Title The patient will move sit<>stand with mod A of 1 person.     Baseline Target date 04/08/2015   Time 8   Period Weeks   PT LONG TERM GOAL #5   Title Ambulation goal to follow if patient progresses to gait  activities.   Baseline Target date 04/08/2015   Time 8   Period Weeks               Plan - 02/26/15 1418    Clinical Impression Statement PT used functional need to work through mobility in session today.  Patient arrived with need for dry depends and we used transfers, scooting in bed, rolling in bed as opportunities to work on mobility.  The patient is improving with sit<>supine, however still needs max assist for scooting over in bed, L LE mgmt in bed, and for rolling to the right side + verbal cues to manage left UE/LE.   PT Next Visit Plan needs bed mobility, rolling, scooting in bed, sit>stand pulling up at parallel bars, increasing standing tolerance with L knee blocked due to buckling tendency   Consulted and Agree with Plan of Care Patient;Family member/caregiver   Family Member Consulted granddaughter        Problem List Patient Active Problem List   Diagnosis Date Noted  . Hemorrhage, intracerebral 02/03/2015  . Hemiparesis 02/03/2015  . Spastic hemiplegia affecting nondominant side 02/03/2015    Alondria Mousseau, PT 02/26/2015, 2:21 PM  [facility] [address] [place], [place], [address] Phone: [phone]   Fax:  [phone]

## 2015-02-26 NOTE — Therapy (Signed)
[facility] [address] [place], [place], [address] Phone: [phone]   Fax:  [phone]  Occupational Therapy Treatment  Patient Details  Name: Michelle Kemp MRN: [id number] Date of Birth: Jul 24, 1940 Referring Provider:  Jackie Plum, MD  Encounter Date: 02/26/2015      OT End of Session - 02/26/15 1646    Visit Number 4   Number of Visits 17   Date for OT Re-Evaluation 04/16/15   Authorization Type self pay   OT Start Time 1400   OT Stop Time 1445   OT Time Calculation (min) 45 min   Activity Tolerance Patient tolerated treatment well   Behavior During Therapy [facility] for tasks assessed/performed      Past Medical History  Diagnosis Date  . Stroke   . Arthritis     Past Surgical History  Procedure Laterality Date  . Hernia repair  2001    There were no vitals filed for this visit.  Visit Diagnosis:  Postural instability  Spastic hemiplegia affecting nondominant side  Unsteadiness on feet      Subjective Assessment - 02/26/15 1633    Subjective  Will I be able to stand?   Patient is accompained by: Family member   Currently in Pain? No/denies   Pain Score 0-No pain                      OT Treatments/Exercises (OP) - 02/26/15 0001    Transfers   Transfers Sit to Stand;Stand to Sit   Sit to Stand 3: Mod assist   Sit to Stand: Patient Percentage 70%   Sit to Stand Details (indicate cue type and reason) Set up environment to foster significant forward weight shift and use of grab bar to aide with sit to stand transition.  Once patient fully forward, she is able to lift herself from chair - needs further repetition of this pattern.   Stand to Sit 3: Mod assist   Stand to Sit Details Patient with improved control in initial descent   Comments Worked with granddaughter to explain the importance of forward bending to help with transitional movements.   ADLs   LB Dressing  Practiced forward reach while seated in sturdy chair to reach feet for lower body dressing.  Patient currently is dressed by family members while in bed - however explained that the act of bending forward will aide overall transfers and general mobility, so encouraged granddaughter to have patient practice at home with help from her family.    Toileting Sit to stand, and stand step transfers to right using a grab bar to simulate toilet transfer.  Patient and granddaughter offered resource on grab bar to install in bathroom to improve active transfers to/ from toilet and to reduce caregiver burden with hygiene and clothing management   Neurological Re-education Exercises   Other Exercises 1 Neuromuscular reeducation while seated in wheelchair to facilitate volitional movement in elbow flexors and extensors.  Patient able to recruit movement in both directions after facilitation.                  OT Education - 02/26/15 1644    Education provided Yes   Education Details sit to stand with grab bar, resource given on grab bars, importance of upper trunk flexion for lower body ADL, and even functional mobility   Person(s) Educated Patient;Caregiver(s)   Methods Explanation;Demonstration;Tactile cues   Comprehension Verbalized understanding  OT Short Term Goals - 02/17/15 1803    OT SHORT TERM GOAL #1   Title Patient will don pull over shirt without assistance (goals due 03/19/15)   Baseline mod assist   Time 4   Period Weeks   Status New   OT SHORT TERM GOAL #2   Title Patient will transition from sit to stand with mod assist as a precursos to eventually allow family to adjust clothing prior to and following toileting   Baseline Dependent    Time 4   Period Weeks   Status New   OT SHORT TERM GOAL #3   Title Patient will report pain no greater than 2-3/10 after positioning in bed by caregivers to allow her to sleep restfully   Baseline Pain disrupts sleep routinely  - pain  10/10 at times   Time 4   Period Weeks   Status New   OT SHORT TERM GOAL #4   Title Patient will reach forward toward lower legs and feet and sustaing position for 10 seconds to aide with lower body bathing and dressing.   Baseline Patient unable to reach forward and therefore is dressed in bed - dependent   Time 4   Period Weeks   Status New   OT SHORT TERM GOAL #5   Title Patient will demonstrate active grasp and release  in left hand with minimal facilitation, to aide with meal prep tasks as a precursor to simple cooking task   Baseline Unable to participate in cooking - dependent upon family   Time 4   Period Weeks   Status New           OT Long Term Goals - 02/17/15 1812    OT LONG TERM GOAL #1   Title Patient will don lower body clothing with moderate assistance while seated, adn transition to standing to pull up clothing (goals due 10/7)   Baseline Dependent - 1-2 caregivers   Time 8   Period Weeks   Status New   OT LONG TERM GOAL #2   Title Patient will transfer to commode with moderate assistance for toileting   Baseline dependent   Time 8   Period Weeks   Status New   OT LONG TERM GOAL #3   Title Patient will stand with no greater than min assist to allow family to complete toilet hygiene    Baseline dependent   Time 8   Period Weeks   Status New   OT LONG TERM GOAL #4   Title Patient will be able to reposition left arm for comfort in upright and reclined positions - such that pain experienced is no greater than 2/10   Baseline Pain can be 10/10 - patient unable to reposition left arm - dependent on caregivers   Time 8   Period Weeks   Status New   OT LONG TERM GOAL #5   Title Patient will prepare simple meal with moderate assistance from family members   Baseline dependent - not able to participate   Time 8   Period Weeks   Status New               Plan - 02/26/15 1649    Clinical Impression Statement Patient continues to show steady  improvement with functional mobility which will lead to greatest reduction in caregiver burden for basic ADL.  Patient has significant fear of movement, specifically, forward moavement, and does best with repetition and minor variations in practice.   Pt  will benefit from skilled therapeutic intervention in order to improve on the following deficits (Retired) Decreased activity tolerance;Decreased balance;Decreased coordination;Decreased endurance;Impaired flexibility;Impaired perceived functional ability;Decreased safety awareness;Decreased range of motion;Decreased mobility;Decreased knowledge of use of DME;Decreased knowledge of precautions;Decreased strength;Increased edema;Increased muscle spasms;Impaired sensation;Pain;Impaired tone;Impaired UE functional use;Impaired vision/preception   Rehab Potential Good   Clinical Impairments Affecting Rehab Potential limited financial resources   OT Frequency 2x / week   OT Duration 8 weeks   OT Treatment/Interventions Self-care/ADL training;Electrical Stimulation;Cryotherapy;Moist Heat;Visual/perceptual remediation/compensation;Patient/family education;Balance training;Splinting;Therapeutic exercises;Therapeutic activities;Neuromuscular education;Functional Mobility Training;Fluidtherapy;Biofeedback;DME and/or AE instruction   Plan Functional mobility, stand balance, NMR to  Left UE   OT Home Exercise Plan initiated on eval with PROM to left elbow, Encouraged forward bending from seated position to aide with LB dressing   Consulted and Agree with Plan of Care Patient;Family member/caregiver        Problem List Patient Active Problem List   Diagnosis Date Noted  . Hemorrhage, intracerebral 02/03/2015  . Hemiparesis 02/03/2015  . Spastic hemiplegia affecting nondominant side 02/03/2015    Collier Salina, OTR/L 02/26/2015, 4:53 PM  [facility] [address] [place], [place],  [address] Phone: [phone]   Fax:  [phone]

## 2015-03-03 ENCOUNTER — Ambulatory Visit: Payer: Self-pay | Admitting: Physical Therapy

## 2015-03-03 ENCOUNTER — Encounter: Payer: Self-pay | Admitting: Physical Therapy

## 2015-03-03 ENCOUNTER — Ambulatory Visit: Payer: Self-pay | Admitting: Occupational Therapy

## 2015-03-03 ENCOUNTER — Encounter: Payer: Self-pay | Admitting: Occupational Therapy

## 2015-03-03 DIAGNOSIS — G8110 Spastic hemiplegia affecting unspecified side: Secondary | ICD-10-CM

## 2015-03-03 DIAGNOSIS — R269 Unspecified abnormalities of gait and mobility: Secondary | ICD-10-CM

## 2015-03-03 DIAGNOSIS — M6281 Muscle weakness (generalized): Secondary | ICD-10-CM

## 2015-03-03 DIAGNOSIS — R293 Abnormal posture: Secondary | ICD-10-CM

## 2015-03-03 DIAGNOSIS — R2681 Unsteadiness on feet: Secondary | ICD-10-CM

## 2015-03-03 DIAGNOSIS — M25612 Stiffness of left shoulder, not elsewhere classified: Secondary | ICD-10-CM

## 2015-03-03 NOTE — Therapy (Signed)
[facility] [address] [place], [place], [address] Phone: [phone]   Fax:  [phone]  Physical Therapy Treatment  Patient Details  Name: Michelle Kemp MRN: [id number] Date of Birth: January 21, 1941 Referring Provider:  Jackie Plum, MD  Encounter Date: 03/03/2015   03/03/15 1452  PT Visits / Re-Eval  Visit Number 8  Number of Visits 16  Date for PT Re-Evaluation 04/08/15  Authorization  Authorization Type Self pay, no eligibility to apply for benefits per daughter's reports  PT Time Calculation  PT Start Time 1445  PT Stop Time 1530  PT Time Calculation (min) 45 min  PT - End of Session  Equipment Utilized During Treatment Gait belt  Activity Tolerance Patient tolerated treatment well  Behavior During Therapy [facility] for tasks assessed/performed     Past Medical History  Diagnosis Date  . Stroke   . Arthritis     Past Surgical History  Procedure Laterality Date  . Hernia repair  2001     03/03/15 1446  Symptoms/Limitations  Subjective No new complaints. Having increased pain in bil knees (left > right) and left hip (more than knees). Worked for 1.5 hours hours on Sunday with private PT and then stood with family on Monday for 3 minutes  Pain Assessment  Currently in Pain? Yes  Pain Score 10  Pain Location Knee (hip)  Pain Orientation Left;Right  Pain Descriptors / Indicators Sore;Tender;Throbbing;Aching  Pain Type Chronic pain  Pain Onset More than a month ago  Pain Frequency Intermittent  Aggravating Factors  increased activity  Pain Relieving Factors rest    There were no vitals filed for this visit.  Visit Diagnosis:  Abnormality of gait  Muscle weakness  Unsteadiness on feet  Spastic hemiplegia affecting nondominant side  Postural instability   Treatment Pt with tenderness and tightness over her left IT band. Manual stretching to left hamstring, IT band and hip adductors.   Rolling of white foam roller to left IT band to decrease tightness and pain  Exercises - NMES concurrent with heels slides x 4 minutes, and then with short arc quads for remaining 11 minutes. Pt needed active assist intitially and progressed to active with estim only for last 4 minutes of short arc quads with full range being achieved.   Ther-activity Rolling onto right side x 2 reps, min assist with 1st rep and mod assist with 2cd rep. Right side lying to sitting edge of mat with mod assist.  Slide board transfer mat>wheelchair mod assist of 2 people for safety and due to pt's back pain.         PT Short Term Goals - 02/06/15 0739    PT SHORT TERM GOAL #1   Title The patient will perform home program with assist from family members.   Baseline Target date 03/08/2015   Time 4   Period Weeks   PT SHORT TERM GOAL #2   Title The patient will move sit<>supine with mod assist of 1 person (baseline +2 total assist).   Baseline Target date 03/08/2015   Time 4   Period Weeks   PT SHORT TERM GOAL #3   Title The patient will transfer with sliding board with mod assist of 1 person (baseline +2 total assist).   Baseline Target date 03/08/2015   Time 4   Period Weeks   PT SHORT TERM GOAL #4   Title The patient will self propel manual wheelchair with minimal verbal cues.   Baseline Target date  03/08/2015   Time 4   Period Weeks   PT SHORT TERM GOAL #5   Title The patient will maintain standing with +2 assist x 2 minutes for improved weight bearing.   Baseline Target date 03/08/2015   Time 4   Period Weeks           PT Long Term Goals - 02/06/15 0745    PT LONG TERM GOAL #1   Title The patient/family will verbalize understanding of CVA warning signs/risk factors.   Baseline Target date 04/08/2015   Time 8   Period Weeks   PT LONG TERM GOAL #2   Title The patient will perform sliding board transfers with min A of 1 person w/c<>mat.   Baseline Target date 04/08/2015   Time 8    Period Weeks   PT LONG TERM GOAL #3   Title The patient will perform sit<>supine with min A of 1 person.   Baseline Target date 04/08/2015   Time 8   Period Weeks   PT LONG TERM GOAL #4   Title The patient will move sit<>stand with mod A of 1 person.     Baseline Target date 04/08/2015   Time 8   Period Weeks   PT LONG TERM GOAL #5   Title Ambulation goal to follow if patient progresses to gait activities.   Baseline Target date 04/08/2015   Time 8   Period Weeks        03/03/15 1453  Plan  Clinical Impression Statement Focued on lower extremity flexibility and strengthening today due to pt having increased pain. Pt did report decreased pain after session, however on sitting up to edge of bed pt with significant increase in lower back pain that resolved wthin a few minutes of sitting. Pt making steady progress toward goals.                                Pt will benefit from skilled therapeutic intervention in order to improve on the following deficits Abnormal gait;Decreased balance;Decreased mobility;Decreased strength;Postural dysfunction;Impaired flexibility;Pain;Decreased activity tolerance;Decreased endurance;Increased muscle spasms;Difficulty walking  Rehab Potential Good  PT Frequency 2x / week  PT Duration 8 weeks  PT Treatment/Interventions Electrical Stimulation;Gait training;Stair training;Neuromuscular re-education;Balance training;Therapeutic exercise;Therapeutic activities;Functional mobility training;Patient/family education;Wheelchair mobility training;Manual techniques;Orthotic Fit/Training  PT Next Visit Plan continue to work on slide board transfers, bed mobility and left leg strengthening, NMES to quad and possible anterior tibialis concurent with ther ex. Assess STG's.  Consulted and Agree with Plan of Care Patient;Family member/caregiver  Family Member Consulted granddaughter    Problem List Patient Active Problem List   Diagnosis Date Noted  . Hemorrhage,  intracerebral 02/03/2015  . Hemiparesis 02/03/2015  . Spastic hemiplegia affecting nondominant side 02/03/2015    Sallyanne Kuster 03/05/2015, 9:46 AM  Sallyanne Kuster, PTA, [facility] [address], [address] [place], [place] [address] [phone] 03/05/2015, 9:46 AM

## 2015-03-03 NOTE — Therapy (Signed)
[facility] [address] [place], [place], [address] Phone: [phone]   Fax:  [phone]  Occupational Therapy Treatment  Patient Details  Name: Michelle Kemp MRN: [id number] Date of Birth: 1941-06-16 Referring Provider:  Jackie Plum, MD  Encounter Date: 03/03/2015      OT End of Session - 03/03/15 1653    Visit Number 5   Number of Visits 17   Date for OT Re-Evaluation 04/16/15   Authorization Type self pay   OT Start Time 1400   OT Stop Time 1445   OT Time Calculation (min) 45 min   Activity Tolerance Patient tolerated treatment well   Behavior During Therapy [facility] for tasks assessed/performed      Past Medical History  Diagnosis Date  . Stroke   . Arthritis     Past Surgical History  Procedure Laterality Date  . Hernia repair  2001    There were no vitals filed for this visit.  Visit Diagnosis:  Postural instability  Unsteadiness on feet  Spastic hemiplegia affecting nondominant side  Stiffness of shoulder joint, left      Subjective Assessment - 03/03/15 1503    Subjective  I want to bath the new baby   Patient is accompained by: Family member   Currently in Pain? Yes   Pain Score 5    Pain Location Knee   Pain Orientation Left   Pain Descriptors / Indicators Sore   Pain Type Chronic pain   Pain Onset More than a month ago   Pain Frequency Intermittent   Aggravating Factors  motion   Pain Relieving Factors rest                      OT Treatments/Exercises (OP) - 03/03/15 0001    ADLs   Toileting Practiced actual commode transfers in bathroom with grab bar.  Patient able to stand with mod assist (partial stand) and pivot to commode to left with max assist.  Able to use grab bar for squat transfer back to wheelchair toward right with max assist - with emphasis on head / hip relationship   Neurological Re-education Exercises   Other Exercises 1 Neuromuscular  reeducation to address volitional control of elbow wrist and digits in supine.  Patient able to recruit elbow flexion, and turn off elbow flexion to extend elbow passively in supine.  Patient able to sustain extended position against gravity with cueing to visually attend to left arm.  Patient now with isolated thumb, wrist, and digit motion.                  OT Education - 03/03/15 1652    Education provided Yes   Education Details discussed with family and patient the importance of forward weight shift for transitional movements, as well as the importance of visual attention to left arm to elicit arm movement   Person(s) Educated Patient;Caregiver(s)   Methods Explanation;Demonstration   Comprehension Verbalized understanding;Verbal cues required;Returned demonstration          OT Short Term Goals - 02/17/15 1803    OT SHORT TERM GOAL #1   Title Patient will don pull over shirt without assistance (goals due 03/19/15)   Baseline mod assist   Time 4   Period Weeks   Status New   OT SHORT TERM GOAL #2   Title Patient will transition from sit to stand with mod assist as a precursos to eventually allow family to adjust clothing  prior to and following toileting   Baseline Dependent    Time 4   Period Weeks   Status New   OT SHORT TERM GOAL #3   Title Patient will report pain no greater than 2-3/10 after positioning in bed by caregivers to allow her to sleep restfully   Baseline Pain disrupts sleep routinely  - pain 10/10 at times   Time 4   Period Weeks   Status New   OT SHORT TERM GOAL #4   Title Patient will reach forward toward lower legs and feet and sustaing position for 10 seconds to aide with lower body bathing and dressing.   Baseline Patient unable to reach forward and therefore is dressed in bed - dependent   Time 4   Period Weeks   Status New   OT SHORT TERM GOAL #5   Title Patient will demonstrate active grasp and release  in left hand with minimal facilitation,  to aide with meal prep tasks as a precursor to simple cooking task   Baseline Unable to participate in cooking - dependent upon family   Time 4   Period Weeks   Status New           OT Long Term Goals - 02/17/15 1812    OT LONG TERM GOAL #1   Title Patient will don lower body clothing with moderate assistance while seated, adn transition to standing to pull up clothing (goals due 10/7)   Baseline Dependent - 1-2 caregivers   Time 8   Period Weeks   Status New   OT LONG TERM GOAL #2   Title Patient will transfer to commode with moderate assistance for toileting   Baseline dependent   Time 8   Period Weeks   Status New   OT LONG TERM GOAL #3   Title Patient will stand with no greater than min assist to allow family to complete toilet hygiene    Baseline dependent   Time 8   Period Weeks   Status New   OT LONG TERM GOAL #4   Title Patient will be able to reposition left arm for comfort in upright and reclined positions - such that pain experienced is no greater than 2/10   Baseline Pain can be 10/10 - patient unable to reposition left arm - dependent on caregivers   Time 8   Period Weeks   Status New   OT LONG TERM GOAL #5   Title Patient will prepare simple meal with moderate assistance from family members   Baseline dependent - not able to participate   Time 8   Period Weeks   Status New               Plan - 03/03/15 1654    Clinical Impression Statement Patient continues to show steady improvement with functional mobility which will lead to decrease in caregiver burden for basic self care skills.  Patient with improved range of motion in left shoulder, decreased pain in left shoulder, and improved active movement in left upper extremity.     Pt will benefit from skilled therapeutic intervention in order to improve on the following deficits (Retired) Decreased activity tolerance;Decreased balance;Decreased coordination;Decreased endurance;Impaired  flexibility;Impaired perceived functional ability;Decreased safety awareness;Decreased range of motion;Decreased mobility;Decreased knowledge of use of DME;Decreased knowledge of precautions;Decreased strength;Increased edema;Increased muscle spasms;Impaired sensation;Pain;Impaired tone;Impaired UE functional use;Impaired vision/preception   Rehab Potential Good   Clinical Impairments Affecting Rehab Potential limited financial resources   OT Frequency 2x /  week   OT Duration 8 weeks   OT Treatment/Interventions Self-care/ADL training;Electrical Stimulation;Cryotherapy;Moist Heat;Visual/perceptual remediation/compensation;Patient/family education;Balance training;Splinting;Therapeutic exercises;Therapeutic activities;Neuromuscular education;Functional Mobility Training;Fluidtherapy;Biofeedback;DME and/or AE instruction   Plan functional mobility - active transfers, attempt to stand - pull to stand with grab bar, NMR LUE   OT Home Exercise Plan on going   Consulted and Agree with Plan of Care Patient;Family member/caregiver   Family Member Consulted granddaughter        Problem List Patient Active Problem List   Diagnosis Date Noted  . Hemorrhage, intracerebral 02/03/2015  . Hemiparesis 02/03/2015  . Spastic hemiplegia affecting nondominant side 02/03/2015    Collier Salina, OTR/L 03/03/2015, 5:00 PM  [facility] [address] [place], [place], [address] Phone: [phone]   Fax:  [phone]

## 2015-03-05 ENCOUNTER — Ambulatory Visit: Payer: Self-pay | Admitting: Physical Therapy

## 2015-03-05 ENCOUNTER — Encounter: Payer: Self-pay | Admitting: Physical Therapy

## 2015-03-05 ENCOUNTER — Ambulatory Visit: Payer: Self-pay | Admitting: Occupational Therapy

## 2015-03-05 DIAGNOSIS — R2681 Unsteadiness on feet: Secondary | ICD-10-CM

## 2015-03-05 DIAGNOSIS — M6281 Muscle weakness (generalized): Secondary | ICD-10-CM

## 2015-03-05 DIAGNOSIS — R269 Unspecified abnormalities of gait and mobility: Secondary | ICD-10-CM

## 2015-03-05 DIAGNOSIS — R293 Abnormal posture: Secondary | ICD-10-CM

## 2015-03-05 DIAGNOSIS — G8110 Spastic hemiplegia affecting unspecified side: Secondary | ICD-10-CM

## 2015-03-08 NOTE — Therapy (Signed)
[facility] [address] [place], [place], [address] Phone: [phone]   Fax:  [phone]  Physical Therapy Treatment  Patient Details  Name: Michelle Kemp MRN: [id number] Date of Birth: 1941/03/20 Referring Provider:  Jackie Plum, MD  Encounter Date: 03/05/2015      PT End of Session - 03/08/15 0146    Visit Number 9   Number of Visits 16   Date for PT Re-Evaluation 04/08/15   Authorization Type Self pay, no eligibility to apply for benefits per daughter's reports   PT Start Time 1400   PT Stop Time 1445   PT Time Calculation (min) 45 min   Equipment Utilized During Treatment Gait belt   Activity Tolerance Patient tolerated treatment well   Behavior During Therapy [facility] for tasks assessed/performed      Past Medical History  Diagnosis Date  . Stroke   . Arthritis     Past Surgical History  Procedure Laterality Date  . Hernia repair  2001    There were no vitals filed for this visit.  Visit Diagnosis:  Postural instability  Unsteadiness on feet  Abnormality of gait  Muscle weakness  Spastic hemiplegia affecting nondominant side     03/08/15 0151  Symptoms/Limitations  Subjective No new complaints.  Grand-daughter reports they are not using the lift at home anymore unless she is by herself, otherwise they are haveing pt scoot or do stand pivot with family assitance. Pt also stood wtih family assistance at the stairs for a few minutes with no knee buckling.  Pain Assessment  Currently in Pain? No/denies  Pain Score 0  Pain Onset More than a month ago    Treatment: Ther-act scoot transfer wheelchair <> mat with mod assist and cues on technique/head orientation with transfers  Sit<> stand X 4 from mat table with left leg blocked, min assist with 2 people for safety. Focused on upright posture and reaching in various directions when there.  X 4 from side of parallel bars: left leg  blocked and min assist of 2 people. Working on upright posture and equal weight bearing in standing.   X 3 in parallel bars: focusing as above:  Once upright, worked on reaching in various directions, then on left UE movement sliding forward/backwards on along bar while maintaining equal weight bearing  During last few stands, backed of of pt's left knee in standing for brief periods with no buckling noted.   Pt able to control her sitting to chair for 3 of the reps, otherwise needed assist due to uncontrolled descent. Pt visually fatigued at end of session with lower extremity trembling noted in standing.         PT Short Term Goals - 02/06/15 0739    PT SHORT TERM GOAL #1   Title The patient will perform home program with assist from family members.   Baseline Target date 03/08/2015   Time 4   Period Weeks   PT SHORT TERM GOAL #2   Title The patient will move sit<>supine with mod assist of 1 person (baseline +2 total assist).   Baseline Target date 03/08/2015   Time 4   Period Weeks   PT SHORT TERM GOAL #3   Title The patient will transfer with sliding board with mod assist of 1 person (baseline +2 total assist).   Baseline Target date 03/08/2015   Time 4   Period Weeks   PT SHORT TERM GOAL #4   Title The patient will  self propel manual wheelchair with minimal verbal cues.   Baseline Target date 03/08/2015   Time 4   Period Weeks   PT SHORT TERM GOAL #5   Title The patient will maintain standing with +2 assist x 2 minutes for improved weight bearing.   Baseline Target date 03/08/2015   Time 4   Period Weeks           PT Long Term Goals - 02/06/15 0745    PT LONG TERM GOAL #1   Title The patient/family will verbalize understanding of CVA warning signs/risk factors.   Baseline Target date 04/08/2015   Time 8   Period Weeks   PT LONG TERM GOAL #2   Title The patient will perform sliding board transfers with min A of 1 person w/c<>mat.   Baseline Target date 04/08/2015    Time 8   Period Weeks   PT LONG TERM GOAL #3   Title The patient will perform sit<>supine with min A of 1 person.   Baseline Target date 04/08/2015   Time 8   Period Weeks   PT LONG TERM GOAL #4   Title The patient will move sit<>stand with mod A of 1 person.     Baseline Target date 04/08/2015   Time 8   Period Weeks   PT LONG TERM GOAL #5   Title Ambulation goal to follow if patient progresses to gait activities.   Baseline Target date 04/08/2015   Time 8   Period Weeks           Plan - 03/08/15 0146    Clinical Impression Statement Focued on standing with tall posture and midline position this session. Pt making great progress, with increased stands and less assitance needed this session. Pt making great progress toward goals.   Pt will benefit from skilled therapeutic intervention in order to improve on the following deficits Abnormal gait;Decreased balance;Decreased mobility;Decreased strength;Postural dysfunction;Impaired flexibility;Pain;Decreased activity tolerance;Decreased endurance;Increased muscle spasms;Difficulty walking   Rehab Potential Good   PT Frequency 2x / week   PT Duration 8 weeks   PT Treatment/Interventions Electrical Stimulation;Gait training;Stair training;Neuromuscular re-education;Balance training;Therapeutic exercise;Therapeutic activities;Functional mobility training;Patient/family education;Wheelchair mobility training;Manual techniques;Orthotic Fit/Training   PT Next Visit Plan continue to work on slide board transfers, bed mobility and left leg strengthening, NMES to quad and possible anterior tibialis concurent with ther ex.    Consulted and Agree with Plan of Care Patient;Family member/caregiver   Family Member Consulted granddaughter        Problem List Patient Active Problem List   Diagnosis Date Noted  . Hemorrhage, intracerebral 02/03/2015  . Hemiparesis 02/03/2015  . Spastic hemiplegia affecting nondominant side 02/03/2015    Sallyanne Kuster 03/08/2015, 1:50 AM  Sallyanne Kuster, PTA, [facility]patient [facility] [address] [place], [place] [address] [phone] 03/08/2015, 1:50 AM

## 2015-03-10 ENCOUNTER — Ambulatory Visit: Payer: Self-pay | Admitting: Occupational Therapy

## 2015-03-11 ENCOUNTER — Ambulatory Visit: Payer: Self-pay | Admitting: Occupational Therapy

## 2015-03-12 ENCOUNTER — Encounter: Payer: Self-pay | Admitting: Occupational Therapy

## 2015-03-16 ENCOUNTER — Encounter: Payer: Self-pay | Admitting: Occupational Therapy

## 2015-03-16 ENCOUNTER — Ambulatory Visit: Payer: Self-pay | Attending: Neurology | Admitting: Occupational Therapy

## 2015-03-16 DIAGNOSIS — R2681 Unsteadiness on feet: Secondary | ICD-10-CM | POA: Insufficient documentation

## 2015-03-16 DIAGNOSIS — M25612 Stiffness of left shoulder, not elsewhere classified: Secondary | ICD-10-CM | POA: Insufficient documentation

## 2015-03-16 DIAGNOSIS — M25512 Pain in left shoulder: Secondary | ICD-10-CM | POA: Insufficient documentation

## 2015-03-16 DIAGNOSIS — R269 Unspecified abnormalities of gait and mobility: Secondary | ICD-10-CM | POA: Insufficient documentation

## 2015-03-16 DIAGNOSIS — M6281 Muscle weakness (generalized): Secondary | ICD-10-CM | POA: Insufficient documentation

## 2015-03-16 DIAGNOSIS — R293 Abnormal posture: Secondary | ICD-10-CM | POA: Insufficient documentation

## 2015-03-16 DIAGNOSIS — G8110 Spastic hemiplegia affecting unspecified side: Secondary | ICD-10-CM | POA: Insufficient documentation

## 2015-03-16 NOTE — Therapy (Signed)
[facility] [address] [place], [place], [address] Phone: [phone]   Fax:  [phone]  Occupational Therapy Treatment  Patient Details  Name: Michelle Kemp MRN: [id number] Date of Birth: 07/08/1941 Referring Provider:  Jackie Plum, MD  Encounter Date: 03/16/2015      OT End of Session - 03/16/15 1705    Visit Number 6   Number of Visits 17   Date for OT Re-Evaluation 04/16/15   Authorization Type self pay   OT Start Time 1535   OT Stop Time 1615   OT Time Calculation (min) 40 min   Activity Tolerance Patient tolerated treatment well   Behavior During Therapy [facility] for tasks assessed/performed      Past Medical History  Diagnosis Date  . Stroke   . Arthritis     Past Surgical History  Procedure Laterality Date  . Hernia repair  2001    There were no vitals filed for this visit.  Visit Diagnosis:  Postural instability  Unsteadiness on feet  Spastic hemiplegia affecting nondominant side      Subjective Assessment - 03/16/15 1629    Subjective  Family indicates that patient is transferring to/from wheelchair with hoyer again.  Patient had begun to do more active transfers at home - but with recent move to new location, patient with slight regression at home.   Patient is accompained by: Family member   Currently in Pain? No/denies   Pain Score 0-No pain                      OT Treatments/Exercises (OP) - 03/16/15 0001    ADLs   Toileting Sit to stand with bilateral UE's grasping a grab bar and mod assist to maintain postural control ,and to aide with left knee control.  Patient able to stand and release grip on grab bar with right arm once balanced.  Patient needed mod cueing to maintain center of mass over base of support when right arm not involved in task.  Patient with strong drift to left, with little ability to self correct.   Functional Mobility Practiced squat pivot  transfers to/from wheelchair - with emphasis on head hip relationship.  Patient able to squat pivot to right with adequate lift off if able to use upper extremity to pull self forward and up.  Patient lacks strength in legs to complete this transition without UE involvement.                OT Education - 03/16/15 1645    Education provided Yes   Education Details Family (granddaughter) expressing frustration at patient's behavior in therapy versus at home.  "She won't do anything for [place] at home."  Discussed that patient extremely fearful of movement and that impacts her trust with various caregivers - patient and family  also recently moved  and environmental change seems to have caused some apprehension in patient as well.     Person(s) Educated Patient;Caregiver(s)   Methods Explanation;Demonstration   Comprehension Verbalized understanding          OT Short Term Goals - 03/16/15 1708    OT SHORT TERM GOAL #2   Title Patient will transition from sit to stand with mod assist as a precursos to eventually allow family to adjust clothing prior to and following toileting   Status Achieved   OT SHORT TERM GOAL #3   Title Patient will report pain no greater than 2-3/10 after positioning in  bed by caregivers to allow her to sleep restfully   Status Achieved           OT Long Term Goals - 02/17/15 1812    OT LONG TERM GOAL #1   Title Patient will don lower body clothing with moderate assistance while seated, adn transition to standing to pull up clothing (goals due 10/7)   Baseline Dependent - 1-2 caregivers   Time 8   Period Weeks   Status New   OT LONG TERM GOAL #2   Title Patient will transfer to commode with moderate assistance for toileting   Baseline dependent   Time 8   Period Weeks   Status New   OT LONG TERM GOAL #3   Title Patient will stand with no greater than min assist to allow family to complete toilet hygiene    Baseline dependent   Time 8   Period Weeks    Status New   OT LONG TERM GOAL #4   Title Patient will be able to reposition left arm for comfort in upright and reclined positions - such that pain experienced is no greater than 2/10   Baseline Pain can be 10/10 - patient unable to reposition left arm - dependent on caregivers   Time 8   Period Weeks   Status New   OT LONG TERM GOAL #5   Title Patient will prepare simple meal with moderate assistance from family members   Baseline dependent - not able to participate   Time 8   Period Weeks   Status New               Plan - 03/16/15 1705    Clinical Impression Statement Patient continues to show improvement with functional mobility during therapy sessions, although with recent changes at home, has shown some regression with functional mobility.     Pt will benefit from skilled therapeutic intervention in order to improve on the following deficits (Retired) Decreased activity tolerance;Decreased balance;Decreased coordination;Decreased endurance;Impaired flexibility;Impaired perceived functional ability;Decreased safety awareness;Decreased range of motion;Decreased mobility;Decreased knowledge of use of DME;Decreased knowledge of precautions;Decreased strength;Increased edema;Increased muscle spasms;Impaired sensation;Pain;Impaired tone;Impaired UE functional use;Impaired vision/preception   Rehab Potential Good   Clinical Impairments Affecting Rehab Potential limited financial resources   OT Frequency 2x / week   OT Duration 8 weeks   OT Treatment/Interventions Self-care/ADL training;Electrical Stimulation;Cryotherapy;Moist Heat;Visual/perceptual remediation/compensation;Patient/family education;Balance training;Splinting;Therapeutic exercises;Therapeutic activities;Neuromuscular education;Functional Mobility Training;Fluidtherapy;Biofeedback;DME and/or AE instruction   Plan functional mobility- active transfers - squat pivot, sit to stand, NMR LUE   OT Home Exercise Plan on going    Consulted and Agree with Plan of Care Patient;Family member/caregiver   Family Member Consulted granddaughter        Problem List Patient Active Problem List   Diagnosis Date Noted  . Hemorrhage, intracerebral 02/03/2015  . Hemiparesis 02/03/2015  . Spastic hemiplegia affecting nondominant side 02/03/2015    Collier Salina, OTR/L 03/16/2015, 5:09 PM  [facility] [address] [place], [place], [address] Phone: [phone]   Fax:  [phone]

## 2015-03-18 ENCOUNTER — Ambulatory Visit: Payer: Self-pay | Admitting: Occupational Therapy

## 2015-03-18 ENCOUNTER — Encounter: Payer: Self-pay | Admitting: Occupational Therapy

## 2015-03-18 ENCOUNTER — Telehealth: Payer: Self-pay | Admitting: Neurology

## 2015-03-18 DIAGNOSIS — R2681 Unsteadiness on feet: Secondary | ICD-10-CM

## 2015-03-18 DIAGNOSIS — R293 Abnormal posture: Secondary | ICD-10-CM

## 2015-03-18 DIAGNOSIS — G8110 Spastic hemiplegia affecting unspecified side: Secondary | ICD-10-CM

## 2015-03-18 NOTE — Telephone Encounter (Signed)
Patient daughter is returning a call regarding the patient's handicap placard.

## 2015-03-18 NOTE — Therapy (Signed)
[facility] [address] [place], [place], [address] Phone: [phone]   Fax:  [phone]  Occupational Therapy Treatment  Patient Details  Name: Michelle Kemp MRN: [id number] Date of Birth: 1941/03/22 Referring Provider:  Benito Mccreedy, MD  Encounter Date: 03/18/2015      OT End of Session - 03/18/15 1504    Visit Number 7   Number of Visits 17   Date for OT Re-Evaluation 04/16/15   Authorization Type self pay   OT Start Time 1400   OT Stop Time 1450   OT Time Calculation (min) 50 min   Activity Tolerance Patient tolerated treatment well   Behavior During Therapy [facility] for tasks assessed/performed      Past Medical History  Diagnosis Date  . Stroke   . Arthritis     Past Surgical History  Procedure Laterality Date  . Hernia repair  2001    There were no vitals filed for this visit.  Visit Diagnosis:  Postural instability  Spastic hemiplegia affecting nondominant side  Unsteadiness on feet      Subjective Assessment - 03/18/15 1453    Currently in Pain? No/denies   Pain Score 0-No pain                      OT Treatments/Exercises (OP) - 03/18/15 0001    ADLs   Toileting Sit to stand in bathroom using grab bar for support.  When patient anticipates possible stepping she has strong reaction, and tends to drift / push to left - weaker side) Sit to stand muttiple attempts using grab bar in front of patient.  Next pracgticed forward weight shift for squat pivot transfer to toilet to left side.  Patient with signficant improvement with squat pivot transfer at a right angle to commode - contextual.     Functional Mobility Practiced sit to stand with support in front of patient but without pulling up from stationary chair.  Patient did well activating both legs to stand from elevated surface.  Patient continuing to focus on head/hip relationship with all weight transfers.     Neurological  Re-education Exercises   Other Exercises 1 Neuromuscular reeducation with emphasis on increasing attention to left arm.  Patient with active elbow flexion and extension, shoulder flexion and extension - needs forced use philosophy.  Patient needs frequent cueing to attend to arm in order to recruit active movement.                  OT Education - 03/18/15 1503    Education provided Yes   Education Details focus on her arm to activate elbow, finger   Person(s) Educated Patient;Caregiver(s)   Methods Explanation;Demonstration   Comprehension Verbalized understanding          OT Short Term Goals - 03/18/15 1507    OT SHORT TERM GOAL #1   Title Patient will don pull over shirt without assistance (goals due 03/19/15)   Status Not Met  not addressed - continue goal   OT SHORT TERM GOAL #2   Title Patient will transition from sit to stand with mod assist as a precursos to eventually allow family to adjust clothing prior to and following toileting   Status Achieved   OT SHORT TERM GOAL #3   Title Patient will report pain no greater than 2-3/10 after positioning in bed by caregivers to allow her to sleep restfully   Status Achieved   OT SHORT TERM GOAL #  4   Title Patient will reach forward toward lower legs and feet and sustaing position for 10 seconds to aide with lower body bathing and dressing.   Status Achieved   OT SHORT TERM GOAL #5   Title Patient will demonstrate active grasp and release  in left hand with minimal facilitation, to aide with meal prep tasks as a precursor to simple cooking task   Status Not Met  continue goal - patient has active grasp but not release           OT Long Term Goals - 02/17/15 1812    OT LONG TERM GOAL #1   Title Patient will don lower body clothing with moderate assistance while seated, adn transition to standing to pull up clothing (goals due 10/7)   Baseline Dependent - 1-2 caregivers   Time 8   Period Weeks   Status New   OT LONG  TERM GOAL #2   Title Patient will transfer to commode with moderate assistance for toileting   Baseline dependent   Time 8   Period Weeks   Status New   OT LONG TERM GOAL #3   Title Patient will stand with no greater than min assist to allow family to complete toilet hygiene    Baseline dependent   Time 8   Period Weeks   Status New   OT LONG TERM GOAL #4   Title Patient will be able to reposition left arm for comfort in upright and reclined positions - such that pain experienced is no greater than 2/10   Baseline Pain can be 10/10 - patient unable to reposition left arm - dependent on caregivers   Time 8   Period Weeks   Status New   OT LONG TERM GOAL #5   Title Patient will prepare simple meal with moderate assistance from family members   Baseline dependent - not able to participate   Time 8   Period Weeks   Status New               Plan - 03/18/15 1504    Clinical Impression Statement Patient showing steady improvement with functional mobility.  Repetition increases comfort with sit to stand and standing, and family reports general decline in level of assistance - although still very dependent.   Pt will benefit from skilled therapeutic intervention in order to improve on the following deficits (Retired) Decreased activity tolerance;Decreased balance;Decreased coordination;Decreased endurance;Impaired flexibility;Impaired perceived functional ability;Decreased safety awareness;Decreased range of motion;Decreased mobility;Decreased knowledge of use of DME;Decreased knowledge of precautions;Decreased strength;Increased edema;Increased muscle spasms;Impaired sensation;Pain;Impaired tone;Impaired UE functional use;Impaired vision/preception   Rehab Potential Good   Clinical Impairments Affecting Rehab Potential limited financial resources   OT Frequency 2x / week   OT Duration 8 weeks   OT Treatment/Interventions Self-care/ADL training;Electrical Stimulation;Cryotherapy;Moist  Heat;Visual/perceptual remediation/compensation;Patient/family education;Balance training;Splinting;Therapeutic exercises;Therapeutic activities;Neuromuscular education;Functional Mobility Training;Fluidtherapy;Biofeedback;DME and/or AE instruction   Plan check short term goals - dressing goals, functional mobility, active transfer squat pivot and sit to from stand   OT Home Exercise Plan on going   Consulted and Agree with Plan of Care Patient;Family member/caregiver   Family Member Consulted granddaughter        Problem List Patient Active Problem List   Diagnosis Date Noted  . Hemorrhage, intracerebral 02/03/2015  . Hemiparesis 02/03/2015  . Spastic hemiplegia affecting nondominant side 02/03/2015    Mariah Milling,  OTR/L 03/18/2015, 3:10 PM  [facility] [address] [place],  [place], [address] Phone: [phone]   Fax:  [phone]

## 2015-03-19 NOTE — Telephone Encounter (Signed)
Please let patiem tknow i received the request and will sign it thanks

## 2015-03-22 ENCOUNTER — Ambulatory Visit: Payer: Self-pay | Admitting: Occupational Therapy

## 2015-03-22 ENCOUNTER — Ambulatory Visit: Payer: Self-pay | Admitting: Physical Therapy

## 2015-03-22 NOTE — Telephone Encounter (Signed)
Completed form by Dr. Lucia Gaskins given back to  MR 03/22/15.

## 2015-03-22 NOTE — Telephone Encounter (Signed)
LVM for [name] to call back. Gave [facility] phone number.

## 2015-03-24 ENCOUNTER — Ambulatory Visit: Payer: Self-pay | Admitting: Physical Therapy

## 2015-03-24 ENCOUNTER — Ambulatory Visit: Payer: Self-pay | Admitting: Occupational Therapy

## 2015-03-29 ENCOUNTER — Ambulatory Visit: Payer: Self-pay | Admitting: Occupational Therapy

## 2015-03-29 ENCOUNTER — Ambulatory Visit: Payer: Self-pay | Admitting: Physical Therapy

## 2015-03-31 ENCOUNTER — Ambulatory Visit: Payer: Self-pay | Admitting: Physical Therapy

## 2015-03-31 ENCOUNTER — Encounter: Payer: Self-pay | Admitting: Physical Therapy

## 2015-03-31 ENCOUNTER — Encounter: Payer: Self-pay | Admitting: Occupational Therapy

## 2015-03-31 ENCOUNTER — Ambulatory Visit: Payer: Self-pay | Admitting: Occupational Therapy

## 2015-03-31 DIAGNOSIS — M25612 Stiffness of left shoulder, not elsewhere classified: Secondary | ICD-10-CM

## 2015-03-31 DIAGNOSIS — G8110 Spastic hemiplegia affecting unspecified side: Secondary | ICD-10-CM

## 2015-03-31 DIAGNOSIS — M6281 Muscle weakness (generalized): Secondary | ICD-10-CM

## 2015-03-31 DIAGNOSIS — R269 Unspecified abnormalities of gait and mobility: Secondary | ICD-10-CM

## 2015-03-31 DIAGNOSIS — R293 Abnormal posture: Secondary | ICD-10-CM

## 2015-03-31 DIAGNOSIS — R2681 Unsteadiness on feet: Secondary | ICD-10-CM

## 2015-03-31 NOTE — Therapy (Signed)
[facility] [address] [place], [place], [address] Phone: [phone]   Fax:  [phone]  Occupational Therapy Treatment  Patient Details  Name: Michelle Kemp MRN: [id number] Date of Birth: [birth year] Referring Provider:  Benito Mccreedy, MD  Encounter Date: 03/31/2015      OT End of Session - 03/31/15 1616    Visit Number 8   Number of Visits 17   Date for OT Re-Evaluation 04/16/15   Authorization Type self pay   OT Start Time 1402   OT Stop Time 1445   OT Time Calculation (min) 43 min   Activity Tolerance Patient tolerated treatment well   Behavior During Therapy [facility] for tasks assessed/performed      Past Medical History  Diagnosis Date  . Stroke   . Arthritis     Past Surgical History  Procedure Laterality Date  . Hernia repair  2001    There were no vitals filed for this visit.  Visit Diagnosis:  Postural instability  Spastic hemiplegia affecting nondominant side  Stiffness of shoulder joint, left      Subjective Assessment - 03/31/15 1604    Subjective  Patient indicates that her arm gets real "hard" at night (tight)   Patient is accompained by: Family member   Currently in Pain? Yes   Pain Location Knee   Pain Orientation Left   Pain Descriptors / Indicators Aching   Pain Type Chronic pain   Pain Onset More than a month ago   Pain Frequency Intermittent                      OT Treatments/Exercises (OP) - 03/31/15 1606    ADLs   Functional Mobility Addressed patient's ability to weight shift forward with lateral scoots.  Patient still has tendency to lean backward with attempts to scoot side to side.  With visual and physical target, and with facilitation for head/hip relationship, patient able to transtion sit to stand with mod assist, today, and transtion left and right on mat with max assist.     Neurological Re-education Exercises   Other Exercises 1  Neuromuscular reeducation to address functional mobility with transitional movements, and also to address pre functional movement in left arm.  Patient has movement present throughout left arm, however, movement is becoming more strongly dominated by synergistic patterning.  Neuro reed to demonstrate to patient that she could recruit muscle activity (elbow flexion) and she needed to stop further recruitment for elbow to relax into extension.  Patient does best with repetition of simple concepts - and she needs additional reinforcement that she can control some movement in her left arm.  With min facilitation and increased time, patient able to grasp, release 1 inch block with left hand   Other Exercises 2 Gentle weight bearing through long left arm in sitting to increase awareness to this side.  Followed immediately with attempts to move arm with cueing to visually attend to task.                  OT Education - 03/31/15 1615    Education provided Yes   Education Details visually attend to arm for movement, weight shifts required for sit to stand and lateral scoot.    Person(s) Educated Associate Professor)   Methods Explanation;Demonstration   Comprehension Verbal cues required;Tactile cues required          OT Short Term Goals - 03/31/15 1618    OT SHORT TERM  GOAL #1   Title Patient will don pull over shirt without assistance (goals due 03/19/15)   Status Not Met   OT SHORT TERM GOAL #2   Title Patient will transition from sit to stand with mod assist as a precursos to eventually allow family to adjust clothing prior to and following toileting   Status Achieved   OT SHORT TERM GOAL #3   Title Patient will report pain no greater than 2-3/10 after positioning in bed by caregivers to allow her to sleep restfully   Status Achieved   OT SHORT TERM GOAL #4   Title Patient will reach forward toward lower legs and feet and sustaing position for 10 seconds to aide with lower body bathing  and dressing.   Status Achieved   OT SHORT TERM GOAL #5   Title Patient will demonstrate active grasp and release  in left hand with minimal facilitation, to aide with meal prep tasks as a precursor to simple cooking task   Status Achieved           OT Long Term Goals - 02/17/15 1812    OT LONG TERM GOAL #1   Title Patient will don lower body clothing with moderate assistance while seated, adn transition to standing to pull up clothing (goals due 10/7)   Baseline Dependent - 1-2 caregivers   Time 8   Period Weeks   Status New   OT LONG TERM GOAL #2   Title Patient will transfer to commode with moderate assistance for toileting   Baseline dependent   Time 8   Period Weeks   Status New   OT LONG TERM GOAL #3   Title Patient will stand with no greater than min assist to allow family to complete toilet hygiene    Baseline dependent   Time 8   Period Weeks   Status New   OT LONG TERM GOAL #4   Title Patient will be able to reposition left arm for comfort in upright and reclined positions - such that pain experienced is no greater than 2/10   Baseline Pain can be 10/10 - patient unable to reposition left arm - dependent on caregivers   Time 8   Period Weeks   Status New   OT LONG TERM GOAL #5   Title Patient will prepare simple meal with moderate assistance from family members   Baseline dependent - not able to participate   Time 8   Period Weeks   Status New               Plan - 03/31/15 1617    Clinical Impression Statement Patient has shown steady improvement with functional mobility, and these concepts are being reinforced and practiced at home.     Pt will benefit from skilled therapeutic intervention in order to improve on the following deficits (Retired) Decreased activity tolerance;Decreased balance;Decreased coordination;Decreased endurance;Impaired flexibility;Impaired perceived functional ability;Decreased safety awareness;Decreased range of motion;Decreased  mobility;Decreased knowledge of use of DME;Decreased knowledge of precautions;Decreased strength;Increased edema;Increased muscle spasms;Impaired sensation;Pain;Impaired tone;Impaired UE functional use;Impaired vision/preception   Rehab Potential Good   Clinical Impairments Affecting Rehab Potential limited financial resources   OT Frequency 2x / week   OT Duration 8 weeks   OT Treatment/Interventions Self-care/ADL training;Electrical Stimulation;Cryotherapy;Moist Heat;Visual/perceptual remediation/compensation;Patient/family education;Balance training;Splinting;Therapeutic exercises;Therapeutic activities;Neuromuscular education;Functional Mobility Training;Fluidtherapy;Biofeedback;DME and/or AE instruction   Plan sit to stand, static stand balance without UE support 9or with decreased UE support)   Consulted and Agree with Plan of Care Patient;Family member/caregiver  Problem List Patient Active Problem List   Diagnosis Date Noted  . Hemorrhage, intracerebral 02/03/2015  . Hemiparesis 02/03/2015  . Spastic hemiplegia affecting nondominant side 02/03/2015    Mariah Milling, OTR/L 03/31/2015, 4:20 PM  [facility] [address] [place], [place], [address] Phone: [phone]   Fax:  [phone]

## 2015-04-02 NOTE — Therapy (Signed)
[facility] [address] [place], [place], [address] Phone: [phone]   Fax:  [phone]  Physical Therapy Treatment  Patient Details  Name: Michelle Kemp MRN: [id number] Date of Birth: [birth year] Referring Provider:  Benito Mccreedy, MD  Encounter Date: 03/31/2015     03/31/15 1451  PT Visits / Re-Eval  Visit Number 10  Number of Visits 16  Date for PT Re-Evaluation 04/08/15  Authorization  Authorization Type Self pay, no eligibility to apply for benefits per daughter's reports  PT Time Calculation  PT Start Time 1446  PT Stop Time 1535  PT Time Calculation (min) 49 min  PT - End of Session  Equipment Utilized During Treatment Gait belt  Activity Tolerance Patient tolerated treatment well  Behavior During Therapy [facility] for tasks assessed/performed    Past Medical History  Diagnosis Date  . Stroke   . Arthritis     Past Surgical History  Procedure Laterality Date  . Hernia repair  2001    There were no vitals filed for this visit.  Visit Diagnosis:  Abnormality of gait  Muscle weakness  Unsteadiness on feet  Spastic hemiplegia affecting nondominant side   03/31/15 1447  Symptoms/Limitations  Subjective No new complaints. Having some knee pain today.   Patient is accompained by: Interpreter  Pertinent History family friend, Rich  Pain Assessment  Currently in Pain? Yes  Pain Score (Unable to understand to rate)  Pain Location Knee (hip to the knee)  Pain Orientation Left  Pain Descriptors / Indicators ("in the bones")  Pain Type Chronic pain  Pain Onset 1 to 4 weeks ago  Pain Frequency Constant  Aggravating Factors  when it gets tight  Pain Relieving Factors rest, stretching      03/31/15 1510  Bed Mobility  Rolling Right 4: Min guard;4: Min assist (x 2 reps)  Rolling Right Details (indicate cue type and reason) minimal cues on technique/sequence  Rolling Left 4: Min guard;4:  Min assist (x 2 reps)  Rolling Left Details (indicate cue type and reason) minimal cues on technique and sequence  Left Sidelying to Sit 4: Min assist (x 1 rep)  Left Sidelying to Sit Details (indicate cue type and reason) min cues on sequence and technique  Transfers  Sit to Stand 4: Min assist;3: Mod assist;With upper extremity assist;From bed  Sit to Stand Details Tactile cues for sequencing;Tactile cues for weight shifting;Tactile cues for posture;Manual facilitation for weight bearing;Manual facilitation for weight shifting;Verbal cues for sequencing;Verbal cues for technique  Sit to Stand Details (indicate cue type and reason) used sheet at pelvis to assist with pelvic control, posture and midline stance. cues on posture and equal weight bearing on legs with left leg blocked in standing. Pt able to stand 3 minutes x 2 reps.  Stand to Sit 2: Max assist;1: +1 Total assist;To bed;With upper extremity assist;Uncontrolled descent  Stand to Sit Details (indicate cue type and reason) Tactile cues for weight shifting;Tactile cues for sequencing;Verbal cues for precautions/safety;Verbal cues for technique;Manual facilitation for weight shifting;Manual facilitation for weight bearing  Stand to Sit Details assist needed to control descent and cues for use of hands to assist with this.   Doctor, hospital Yes  Wheelchair Assistance 4: Writer cues/gestures for sequencing;Visual cues for safe use of DME/AE;Verbal cues for technique;Verbal cues for sequencing;Verbal cues for safe use of DME/AE  Wheelchair Propulsion Right upper extremity;Right lower extremity  Wheelchair Parts Management Needs  assistance  Distance 50  Comments mod to max multimodal cues needed for how to steer wheelchair with leg and use arm to propel wheelchair. pt able to use arm effectively to go forward/backward/and turn both ways. Pt still needs cues and practice using  leg to steer wheelchair as she keeps going left into objects/walls.                                           PT Short Term Goals - 03/31/15 1451    PT SHORT TERM GOAL #1   Title The patient will perform home program with assist from family members.   Baseline 03/31/15: independent with family assist and with private pay PT on Sunday   Status Achieved   PT SHORT TERM GOAL #2   Title The patient will move sit<>supine with mod assist of 1 person (baseline +2 total assist).   Baseline 03/31/15: min assist of 1 person   Status Achieved   PT SHORT TERM GOAL #3   Title The patient will transfer with sliding board with mod assist of 1 person (baseline +2 total assist).   Baseline 03/31/15: pt min to mod assist of 1 person today both ways on slide board   Status Achieved   PT SHORT TERM GOAL #4   Title The patient will self propel manual wheelchair with minimal verbal cues.   Baseline 03/31/15: initiated today with max cues needed, decreased to minimal towards end of session.   Status Partially Met   PT SHORT TERM GOAL #5   Title The patient will maintain standing with +2 assist x 2 minutes for improved weight bearing.   Baseline 03/31/15: min to mod assist with left knee blocked x 3 minutes, 1 person assist   Status Achieved           PT Long Term Goals - 02/06/15 0745    PT LONG TERM GOAL #1   Title The patient/family will verbalize understanding of CVA warning signs/risk factors.   Baseline Target date 04/08/2015   Time 8   Period Weeks   PT LONG TERM GOAL #2   Title The patient will perform sliding board transfers with min A of 1 person w/c<>mat.   Baseline Target date 04/08/2015   Time 8   Period Weeks   PT LONG TERM GOAL #3   Title The patient will perform sit<>supine with min A of 1 person.   Baseline Target date 04/08/2015   Time 8   Period Weeks   PT LONG TERM GOAL #4   Title The patient will move sit<>stand with mod A of 1 person.     Baseline Target date 04/08/2015    Time 8   Period Weeks   PT LONG TERM GOAL #5   Title Ambulation goal to follow if patient progresses to gait activities.   Baseline Target date 04/08/2015   Time 8   Period Weeks        09 /21/16 1451  Plan  Clinical Impression Statement Pt has met 4/5 STG's today. Partially met remaining goals. Just started addressing this goal in this session. With additional practice/cues pt could meet the self propel wheelchair goal. Pt did demo increased standing tolerance today as well. Progressing toward LTG's.  Pt will benefit from skilled therapeutic intervention in order to improve on the following deficits Abnormal gait;Decreased balance;Decreased mobility;Decreased strength;Postural dysfunction;Impaired flexibility;Pain;Decreased activity  tolerance;Decreased endurance;Increased muscle spasms;Difficulty walking  Rehab Potential Good  PT Frequency 2x / week  PT Duration 8 weeks  PT Treatment/Interventions Electrical Stimulation;Gait training;Stair training;Neuromuscular re-education;Balance training;Therapeutic exercise;Therapeutic activities;Functional mobility training;Patient/family education;Wheelchair mobility training;Manual techniques;Orthotic Fit/Training  PT Next Visit Plan continue to work on slide board transfers, bed mobility and left leg strengthening, NMES to quad and possible anterior tibialis concurent with ther ex. continue to work on pt self propelling wheelchair.  Consulted and Agree with Plan of Care Patient;Family member/caregiver  Family Member Consulted granddaughter            Problem List Patient Active Problem List   Diagnosis Date Noted  . Hemorrhage, intracerebral 02/03/2015  . Hemiparesis 02/03/2015  . Spastic hemiplegia affecting nondominant side 02/03/2015    Willow Ora 04/02/2015, 10:21 PM  Willow Ora, PTA, [facility] [address], [place], [place] [address] [phone] 04/02/2015, 10:21 PM

## 2015-04-05 ENCOUNTER — Encounter: Payer: Self-pay | Admitting: Physical Therapy

## 2015-04-05 ENCOUNTER — Encounter: Payer: Self-pay | Admitting: Occupational Therapy

## 2015-04-05 ENCOUNTER — Ambulatory Visit: Payer: Self-pay | Admitting: Physical Therapy

## 2015-04-05 ENCOUNTER — Ambulatory Visit: Payer: Self-pay | Admitting: Occupational Therapy

## 2015-04-05 DIAGNOSIS — G8110 Spastic hemiplegia affecting unspecified side: Secondary | ICD-10-CM

## 2015-04-05 DIAGNOSIS — R269 Unspecified abnormalities of gait and mobility: Secondary | ICD-10-CM

## 2015-04-05 DIAGNOSIS — R2681 Unsteadiness on feet: Secondary | ICD-10-CM

## 2015-04-05 DIAGNOSIS — M6281 Muscle weakness (generalized): Secondary | ICD-10-CM

## 2015-04-05 DIAGNOSIS — R293 Abnormal posture: Secondary | ICD-10-CM

## 2015-04-05 NOTE — Therapy (Signed)
[facility] [address] [place], [place], [address] Phone: [phone]   Fax:  [phone]  Physical Therapy Treatment  Patient Details  Name: Michelle Kemp MRN: [id number] Date of Birth: [birth year] Referring Provider:  Benito Mccreedy, MD  Encounter Date: 04/05/2015      PT End of Session - 04/05/15 1451    Visit Number 11   Number of Visits 16   Date for PT Re-Evaluation 04/08/15   Authorization Type Self pay, no eligibility to apply for benefits per daughter's reports   PT Start Time 1448   PT Stop Time 1530   PT Time Calculation (min) 42 min   Equipment Utilized During Treatment Gait belt   Activity Tolerance Patient tolerated treatment well   Behavior During Therapy [facility] for tasks assessed/performed      Past Medical History  Diagnosis Date  . Stroke   . Arthritis     Past Surgical History  Procedure Laterality Date  . Hernia repair  2001    There were no vitals filed for this visit.  Visit Diagnosis:  Unsteadiness on feet  Postural instability  Abnormality of gait  Muscle weakness  Spastic hemiplegia affecting nondominant side      Subjective Assessment - 04/05/15 1451    Subjective No new complaints. No pain or falls to report.   Currently in Pain? No/denies   Pain Score 0-No pain     Treatment: Seated at edge of mat: Foot slides on slide board with towel under her foot for knee flexion and extension activiation with assistance, cues and facilitation needed for muscle activation and ex form.   NMES to left quad and anterior tibilais concurrently with active assist exercises. Pt performing active assist to passive DF with long arc quad during on times and rest with off times. EMPI unit used, large muscle, atropy and intensity to tolerance x 10 minutes.   Standing at paralllel bars X 3 reps total with min assist to stand and min guard assist to supervision for static standing  balance. Increased assist needed when attempting to add dynamic movements in: - attempted right heel raises with left leg blocked to prevent buckling. Pt able to perform 3-4 before loss of balance and needing to sit down. - attempted to have pt step right foot up onto base of parallel bars and then back down. Max cues needed (visual, tactile and verbal) for pt to perform 1 rep. Loss of balance and needed to be sat down into wheelchair for safety.  - attempted to have pt lift left leg up while in stance, looking for hip flexion/any muscle activation. Pt unable to even with assistance due to decreased balance while attempting making standing without external support/assistance unsafe. Will plan to attempt this again when second person available to help with balance.            PT Short Term Goals - 03/31/15 1451    PT SHORT TERM GOAL #1   Title The patient will perform home program with assist from family members.   Baseline 03/31/15: independent with family assist and with private pay PT on Sunday   Status Achieved   PT SHORT TERM GOAL #2   Title The patient will move sit<>supine with mod assist of 1 person (baseline +2 total assist).   Baseline 03/31/15: min assist of 1 person   Status Achieved   PT SHORT TERM GOAL #3   Title The patient will transfer with sliding board with mod  assist of 1 person (baseline +2 total assist).   Baseline 03/31/15: pt min to mod assist of 1 person today both ways on slide board   Status Achieved   PT SHORT TERM GOAL #4   Title The patient will self propel manual wheelchair with minimal verbal cues.   Baseline 03/31/15: initiated today with max cues needed, decreased to minimal towards end of session.   Status Partially Met   PT SHORT TERM GOAL #5   Title The patient will maintain standing with +2 assist x 2 minutes for improved weight bearing.   Baseline 03/31/15: min to mod assist with left knee blocked x 3 minutes, 1 person assist   Status Achieved            PT Long Term Goals - 02/06/15 0745    PT LONG TERM GOAL #1   Title The patient/family will verbalize understanding of CVA warning signs/risk factors.   Baseline Target date 04/08/2015   Time 8   Period Weeks   PT LONG TERM GOAL #2   Title The patient will perform sliding board transfers with min A of 1 person w/c<>mat.   Baseline Target date 04/08/2015   Time 8   Period Weeks   PT LONG TERM GOAL #3   Title The patient will perform sit<>supine with min A of 1 person.   Baseline Target date 04/08/2015   Time 8   Period Weeks   PT LONG TERM GOAL #4   Title The patient will move sit<>stand with mod A of 1 person.     Baseline Target date 04/08/2015   Time 8   Period Weeks   PT LONG TERM GOAL #5   Title Ambulation goal to follow if patient progresses to gait activities.   Baseline Target date 04/08/2015   Time 8   Period Weeks           Plan - 04/05/15 1451    Clinical Impression Statement Trace to minimal muscle contraction noted of quads intitally wth NMES, improved with facilitation. Pt fatigued after NMES and OT session, limited standing tolerance at end of PT session. Making slow, steady progress toward goals.   Pt will benefit from skilled therapeutic intervention in order to improve on the following deficits Abnormal gait;Decreased balance;Decreased mobility;Decreased strength;Postural dysfunction;Impaired flexibility;Pain;Decreased activity tolerance;Decreased endurance;Increased muscle spasms;Difficulty walking   Rehab Potential Good   PT Frequency 2x / week   PT Duration 8 weeks   PT Treatment/Interventions Electrical Stimulation;Gait training;Stair training;Neuromuscular re-education;Balance training;Therapeutic exercise;Therapeutic activities;Functional mobility training;Patient/family education;Wheelchair mobility training;Manual techniques;Orthotic Fit/Training   PT Next Visit Plan assess LTG's.    Consulted and Agree with Plan of Care Patient;Family  member/caregiver   Family Member Consulted granddaughter        Problem List Patient Active Problem List   Diagnosis Date Noted  . Hemorrhage, intracerebral 02/03/2015  . Hemiparesis 02/03/2015  . Spastic hemiplegia affecting nondominant side 02/03/2015    Willow Ora 04/06/2015, 1:07 PM  Willow Ora, PTA, [facility] [address], [place], [place] [address] [phone] 04/06/2015, 1:07 PM

## 2015-04-05 NOTE — Therapy (Signed)
[facility] [address] [place], [place], [address] Phone: [phone]   Fax:  [phone]  Occupational Therapy Treatment  Patient Details  Name: Michelle Kemp MRN: [id number] Date of Birth: [birth year] Referring Cherylin Waguespack:  Benito Mccreedy, MD  Encounter Date: 04/05/2015      OT End of Session - 04/05/15 1532    Visit Number 9   Number of Visits 17   Date for OT Re-Evaluation 04/16/15   Authorization Type self pay   OT Start Time 1400   OT Stop Time 1445   OT Time Calculation (min) 45 min   Activity Tolerance Patient tolerated treatment well   Behavior During Therapy [facility] for tasks assessed/performed      Past Medical History  Diagnosis Date  . Stroke   . Arthritis     Past Surgical History  Procedure Laterality Date  . Hernia repair  2001    There were no vitals filed for this visit.  Visit Diagnosis:  Unsteadiness on feet  Postural instability  Spastic hemiplegia affecting nondominant side      Subjective Assessment - 04/05/15 1525    Subjective  Patient indicates no pain today   Patient is accompained by: Family member   Currently in Pain? Yes   Pain Score 0-No pain                      OT Treatments/Exercises (OP) - 04/05/15 0001    ADLs   UB Dressing Patient has remaining short term goal relating to independence with dressing upper body.  Patient today had pull over shirt to allow checking this goal.  Patient required min assist to doff shirt, and mod assist to don shirt.  Patient attends to left side with this task, but does not seem to have learned dressing techniques to aide with independence.  Will continue to address as able.     Functional Mobility Addressed sit to stand initially in gym where grab bar is available.  Patient with improved ability to maintain postural control in center during sit to stand.  Patient able to pull self with UE, and use legs to push up into  standing.  Patient once standing able to sustain static position with UE support on bar for 30 seconds without additional support.  Patient with minimal support able to take UE out of support and regain balance with frequent physical and verbal cues.  Patient with strong posterior preference in standing, and stands historically with an amount of hip and knee flexion.     Cooking Continued to work on sit to stand, and standing in more functional context.  Standing at sink to wash dishes.  Patient initially hesitant, although quickly understood concept of standing with UE's involved in meaningful functional activity.  Patient able to correct posterior loss of balance with light minimal cueing, and frequent verbal prompts.  Patient able to accept less and less assistance for standing and stood close to 3 minutes without need for rest break.                  OT Education - 04/05/15 1532    Education provided Yes   Education Details stand and find balance point before releasing UE's from support   Person(s) Educated Patient;Caregiver(s)   Methods Explanation;Demonstration   Comprehension Returned demonstration;Verbal cues required;Tactile cues required          OT Short Term Goals - 03/31/15 1618    OT SHORT TERM  GOAL #1   Title Patient will don pull over shirt without assistance (goals due 03/19/15)   Status Not Met   OT SHORT TERM GOAL #2   Title Patient will transition from sit to stand with mod assist as a precursos to eventually allow family to adjust clothing prior to and following toileting   Status Achieved   OT SHORT TERM GOAL #3   Title Patient will report pain no greater than 2-3/10 after positioning in bed by caregivers to allow her to sleep restfully   Status Achieved   OT SHORT TERM GOAL #4   Title Patient will reach forward toward lower legs and feet and sustaing position for 10 seconds to aide with lower body bathing and dressing.   Status Achieved   OT SHORT TERM GOAL  #5   Title Patient will demonstrate active grasp and release  in left hand with minimal facilitation, to aide with meal prep tasks as a precursor to simple cooking task   Status Achieved           OT Long Term Goals - 04/05/15 1534    OT LONG TERM GOAL #1   Title Patient will don lower body clothing with moderate assistance while seated, adn transition to standing to pull up clothing (goals due 10/7)   Status On-going   OT LONG TERM GOAL #2   Title Patient will transfer to commode with moderate assistance for toileting   Status On-going   OT LONG TERM GOAL #3   Title Patient will stand with no greater than min assist to allow family to complete toilet hygiene    Status On-going   OT LONG TERM GOAL #4   Title Patient will be able to reposition left arm for comfort in upright and reclined positions - such that pain experienced is no greater than 2/10   Status On-going   OT LONG TERM GOAL #5   Title Patient will prepare simple meal with moderate assistance from family members   Status On-going               Plan - 04/05/15 1533    Clinical Impression Statement Patient continues to show steady improvement with functional moibility, concepts are being carried over to home practice with family members.     Pt will benefit from skilled therapeutic intervention in order to improve on the following deficits (Retired) Decreased activity tolerance;Decreased balance;Decreased coordination;Decreased endurance;Impaired flexibility;Impaired perceived functional ability;Decreased safety awareness;Decreased range of motion;Decreased mobility;Decreased knowledge of use of DME;Decreased knowledge of precautions;Decreased strength;Increased edema;Increased muscle spasms;Impaired sensation;Pain;Impaired tone;Impaired UE functional use;Impaired vision/preception   Rehab Potential Good   Clinical Impairments Affecting Rehab Potential limited financial resources   OT Frequency 2x / week   OT  Duration 8 weeks   OT Treatment/Interventions Self-care/ADL training;Electrical Stimulation;Cryotherapy;Moist Heat;Visual/perceptual remediation/compensation;Patient/family education;Balance training;Splinting;Therapeutic exercises;Therapeutic activities;Neuromuscular education;Functional Mobility Training;Fluidtherapy;Biofeedback;DME and/or AE instruction   Plan sit to stand, static to dynamic stand, UE NMR   OT Home Exercise Plan on going   Consulted and Agree with Plan of Care Patient;Family member/caregiver        Problem List Patient Active Problem List   Diagnosis Date Noted  . Hemorrhage, intracerebral 02/03/2015  . Hemiparesis 02/03/2015  . Spastic hemiplegia affecting nondominant side 02/03/2015    Mariah Milling, OTR/L 04/05/2015, 3:37 PM  [facility] [address] [place], [place], [address] Phone: [phone]   Fax:  [phone]

## 2015-04-07 ENCOUNTER — Ambulatory Visit: Payer: Self-pay | Admitting: Physical Therapy

## 2015-04-07 ENCOUNTER — Encounter: Payer: Self-pay | Admitting: Occupational Therapy

## 2015-04-07 ENCOUNTER — Encounter: Payer: Self-pay | Admitting: Physical Therapy

## 2015-04-07 ENCOUNTER — Ambulatory Visit: Payer: Self-pay | Admitting: Occupational Therapy

## 2015-04-07 DIAGNOSIS — M25612 Stiffness of left shoulder, not elsewhere classified: Secondary | ICD-10-CM

## 2015-04-07 DIAGNOSIS — R293 Abnormal posture: Secondary | ICD-10-CM

## 2015-04-07 DIAGNOSIS — R2681 Unsteadiness on feet: Secondary | ICD-10-CM

## 2015-04-07 DIAGNOSIS — R269 Unspecified abnormalities of gait and mobility: Secondary | ICD-10-CM

## 2015-04-07 DIAGNOSIS — G8110 Spastic hemiplegia affecting unspecified side: Secondary | ICD-10-CM

## 2015-04-07 DIAGNOSIS — M6281 Muscle weakness (generalized): Secondary | ICD-10-CM

## 2015-04-07 DIAGNOSIS — M25512 Pain in left shoulder: Secondary | ICD-10-CM

## 2015-04-07 NOTE — Therapy (Signed)
[name] [address] [place], [place], [address] Phone: [phone]   Fax:  [phone]  Occupational Therapy Treatment  Patient Details  Name: Michelle Kemp MRN: [id number] Date of Birth: 03-05-1941 Referring Provider:  Benito Mccreedy, MD  Encounter Date: 04/07/2015      OT End of Session - 04/07/15 1608    Visit Number 10   Number of Visits 17   Date for OT Re-Evaluation 04/16/15   Authorization Type self pay   OT Start Time 1403   OT Stop Time 1445   OT Time Calculation (min) 42 min   Activity Tolerance Patient limited by pain   Behavior During Therapy [facility] for tasks assessed/performed      Past Medical History  Diagnosis Date  . Stroke   . Arthritis     Past Surgical History  Procedure Laterality Date  . Hernia repair  2001    There were no vitals filed for this visit.  Visit Diagnosis:  Stiffness of shoulder joint, left  Pain in joint, shoulder region, left  Spastic hemiplegia affecting nondominant side  Postural instability      Subjective Assessment - 04/07/15 1556    Subjective  Patient indicates significant pain in left elbow. Patient very guarded with left elbow today.   Patient is accompained by: Family member   Currently in Pain? Yes   Pain Score --  unable to score - wincing with passive range flexion or extension   Pain Location Elbow   Pain Orientation Left   Pain Descriptors / Indicators Aching   Pain Type Acute pain   Pain Onset Yesterday   Pain Frequency Intermittent   Aggravating Factors  elbow flexion / extension   Pain Relieving Factors rest, pain reduced by stretching   Multiple Pain Sites No                      OT Treatments/Exercises (OP) - 04/07/15 0001    ADLs   LB Dressing Addressed patient's ability to doff and donn socks, and don shoes today with emphasis on flexing trunk forward in sitting to aide with this functional task.  Patient  able to passively allow left arm to dangle with shoulder flexion, and elbow extension without discomfort following stretching.  Patient unable to incorporate left hand into this task.  Patient unable to unweight left foot to doff / don sock and shoe at this time.  In standing. once balanced, patient able to free UE from support to reach overhead with right arm.  This depicts a significant improvement in stand balance even if only momentary.   Neurological Re-education Exercises   Other Exercises 1 Patient in spasm in left arm today.  Patient nearly in tears by pain in left elbow.  No heat, or edema noted, passive flexion / extension initially increased pain response.  Ice applied to relieve pain.  Shoulder range of motion in supine and in sidelying very difficult due to increased tension in left arm.  Discussed with patient through interpreter that moving through stiffness will lead to reduced pain overall.  [name] has not been able to complete UE stretching program at home due to increased tension and pain.  Explained that immobility will lead to further stiffness and pain.  Patient does not seem to understand, although family member expressed understanding.  Following stretching, patient reported significant reduction in elbow pain.  Patient unable to offer numerical score to pain due to language barrier, but  visibly showing signs of less discomfort.                 OT Education - 04/07/15 1607    Education provided Yes   Education Details importance of daily UE stretching to prevent further pain, and tightness   Person(s) Educated Patient;Caregiver(s)   Methods Explanation;Demonstration   Comprehension Verbal cues required;Need further instruction          OT Short Term Goals - 03/31/15 1618    OT SHORT TERM GOAL #1   Title Patient will don pull over shirt without assistance (goals due 03/19/15)   Status Not Met   OT SHORT TERM GOAL #2   Title Patient will transition from  sit to stand with mod assist as a precursos to eventually allow family to adjust clothing prior to and following toileting   Status Achieved   OT SHORT TERM GOAL #3   Title Patient will report pain no greater than 2-3/10 after positioning in bed by caregivers to allow her to sleep restfully   Status Achieved   OT SHORT TERM GOAL #4   Title Patient will reach forward toward lower legs and feet and sustaing position for 10 seconds to aide with lower body bathing and dressing.   Status Achieved   OT SHORT TERM GOAL #5   Title Patient will demonstrate active grasp and release  in left hand with minimal facilitation, to aide with meal prep tasks as a precursor to simple cooking task   Status Achieved           OT Long Term Goals - 04/05/15 1534    OT LONG TERM GOAL #1   Title Patient will don lower body clothing with moderate assistance while seated, adn transition to standing to pull up clothing (goals due 10/7)   Status On-going   OT LONG TERM GOAL #2   Title Patient will transfer to commode with moderate assistance for toileting   Status On-going   OT LONG TERM GOAL #3   Title Patient will stand with no greater than min assist to allow family to complete toilet hygiene    Status On-going   OT LONG TERM GOAL #4   Title Patient will be able to reposition left arm for comfort in upright and reclined positions - such that pain experienced is no greater than 2/10   Status On-going   OT LONG TERM GOAL #5   Title Patient will prepare simple meal with moderate assistance from family members   Status On-going               Plan - 04/07/15 1608    Clinical Impression Statement Patient shows steady improvement toward functional mobility goasl, yet is limited by lack of motor control in trunk and left UE, spasticity in left extremities, and intermittent but significant pain in knees,a dn left arm.     Pt will benefit from skilled therapeutic intervention in order to improve on the  following deficits (Retired) Decreased activity tolerance;Decreased balance;Decreased coordination;Decreased endurance;Impaired flexibility;Impaired perceived functional ability;Decreased safety awareness;Decreased range of motion;Decreased mobility;Decreased knowledge of use of DME;Decreased knowledge of precautions;Decreased strength;Increased edema;Increased muscle spasms;Impaired sensation;Pain;Impaired tone;Impaired UE functional use;Impaired vision/preception   Rehab Potential Good   Clinical Impairments Affecting Rehab Potential limited financial resources   OT Frequency 2x / week   OT Duration 8 weeks   OT Treatment/Interventions Self-care/ADL training;Electrical Stimulation;Cryotherapy;Moist Heat;Visual/perceptual remediation/compensation;Patient/family education;Balance training;Splinting;Therapeutic exercises;Therapeutic activities;Neuromuscular education;Functional Mobility Training;Fluidtherapy;Biofeedback;DME and/or AE instruction   Plan UE NMR, sit  to stand, stand for function   OT Home Exercise Plan on going   Consulted and Agree with Plan of Care Patient;Family member/caregiver        Problem List Patient Active Problem List   Diagnosis Date Noted  . Hemorrhage, intracerebral 02/03/2015  . Hemiparesis 02/03/2015  . Spastic hemiplegia affecting nondominant side 02/03/2015    Mariah Milling, OTR/L 04/07/2015, 4:13 PM  [facility] [address] [place], [place], [address] Phone: [phone]   Fax:  [phone]

## 2015-04-08 NOTE — Therapy (Signed)
[facility] [address] [place], [place], [address] Phone: [phone]   Fax:  [phone]  Physical Therapy Treatment  Patient Details  Name: Michelle Kemp MRN: [id number] Date of Birth: [birth year] Referring Provider:  Benito Mccreedy, MD  Encounter Date: 04/07/2015      PT End of Session - 04/07/15 1453    Visit Number 12   Number of Visits 16   Date for PT Re-Evaluation 04/08/15   Authorization Type Self pay, no eligibility to apply for benefits per daughter's reports   PT Start Time 1448   PT Stop Time 1530   PT Time Calculation (min) 42 min   Equipment Utilized During Treatment Gait belt   Activity Tolerance Patient tolerated treatment well   Behavior During Therapy [facility] for tasks assessed/performed      Past Medical History  Diagnosis Date  . Stroke   . Arthritis     Past Surgical History  Procedure Laterality Date  . Hernia repair  2001    There were no vitals filed for this visit.  Visit Diagnosis:  Muscle weakness  Abnormality of gait  Postural instability  Unsteadiness on feet      Subjective Assessment - 04/07/15 1450    Subjective Having increased left elbow pain today, was addressed in OT session, bettern now. No falls to report.   Currently in Pain? Yes   Pain Location Elbow   Pain Orientation Left   Pain Descriptors / Indicators Aching;Discomfort;Tightness;Sore   Pain Type Chronic pain;Acute pain   Pain Onset Yesterday   Pain Frequency Constant   Aggravating Factors  bending elbow, straightening elbow   Pain Relieving Factors stretching, rest     Treatment: Sit<>stand from mat with sheet assist at pelvis, min assist to min guard assist with pt pulling up on chair arm rest. Once standing had pt transition hand from arm rest to therapist shoulder to promote more upright posture. Cues/facilitaiton for pelvic position and posture with standing. Increased assist needed with hand  position transitions in standing due to left lateral lean/weight shift.   Min guard to min assist for lateral scooting mat table to wheelchair. Max cues for hand and head position with scooting.   Sit<stand wheelchair<> parallel bar (facing bars from outside). Min guard to min assist with left leg blocked to prevent buckling. Min assist to supervision for standing balance. Increased assist needed when performing any activity due to left lateral lean/balance loss. Pt supported under left forearm/elbow provided most stability with the following activities, along with right UE support on bars: Performed following while standing: Right heel raises Right leg fwd/bwd stepping bil leg mini squats, assist needed to bring left leg back straight each time. No buckling noted.        PT Short Term Goals - 03/31/15 1451    PT SHORT TERM GOAL #1   Title The patient will perform home program with assist from family members.   Baseline 03/31/15: independent with family assist and with private pay PT on Sunday   Status Achieved   PT SHORT TERM GOAL #2   Title The patient will move sit<>supine with mod assist of 1 person (baseline +2 total assist).   Baseline 03/31/15: min assist of 1 person   Status Achieved   PT SHORT TERM GOAL #3   Title The patient will transfer with sliding board with mod assist of 1 person (baseline +2 total assist).   Baseline 03/31/15: pt min to mod assist of 1  person today both ways on slide board   Status Achieved   PT SHORT TERM GOAL #4   Title The patient will self propel manual wheelchair with minimal verbal cues.   Baseline 03/31/15: initiated today with max cues needed, decreased to minimal towards end of session.   Status Partially Met   PT SHORT TERM GOAL #5   Title The patient will maintain standing with +2 assist x 2 minutes for improved weight bearing.   Baseline 03/31/15: min to mod assist with left knee blocked x 3 minutes, 1 person assist   Status Achieved            PT Long Term Goals - 04/08/15 1604    PT LONG TERM GOAL #1   Title The patient/family will verbalize understanding of CVA warning signs/risk factors.   Baseline 04/07/15: not addressed to date   Status Not Met   PT LONG TERM GOAL #2   Title The patient will perform sliding board transfers with min A of 1 person w/c<>mat.   Baseline 04/07/15: met on previous sessions   Time 8   Status Achieved   PT LONG TERM GOAL #3   Title The patient will perform sit<>supine with min A of 1 person.   Baseline 03/07/15: met on previous sessions   Status Achieved   PT LONG TERM GOAL #4   Title The patient will move sit<>stand with mod A of 1 person.     Baseline 04/07/15: met   PT LONG TERM GOAL #5   Title Ambulation goal to follow if patient progresses to gait activities.   Baseline 04/07/15: have attempted pregait activities, not fully addressed.   Status On-going           Plan - 04/07/15 1453    Clinical Impression Statement Pt with improved standing tolerance today. Attempted lower extremitiy movement's, however pt does not have the strength/balance to remain standing while performing increased motions of legs. With 2cd person assist this may be possible or with more UE support ( ? trying platform walker).                                                                   Pt will benefit from skilled therapeutic intervention in order to improve on the following deficits Abnormal gait;Decreased balance;Decreased mobility;Decreased strength;Postural dysfunction;Impaired flexibility;Pain;Decreased activity tolerance;Decreased endurance;Increased muscle spasms;Difficulty walking   Rehab Potential Good   PT Frequency 2x / week   PT Duration 8 weeks   PT Treatment/Interventions Electrical Stimulation;Gait training;Stair training;Neuromuscular re-education;Balance training;Therapeutic exercise;Therapeutic activities;Functional mobility training;Patient/family education;Wheelchair mobility  training;Manual techniques;Orthotic Fit/Training   PT Next Visit Plan PT to renew;continue toward goals.   Consulted and Agree with Plan of Care Patient;Family member/caregiver   Family Member Consulted granddaughter        Problem List Patient Active Problem List   Diagnosis Date Noted  . Hemorrhage, intracerebral 02/03/2015  . Hemiparesis 02/03/2015  . Spastic hemiplegia affecting nondominant side 02/03/2015    Willow Ora 04/08/2015, 4:06 PM  Willow Ora, PTA, [facility] [address] [place], [place] [address] [phone] 04/08/2015, 4:06 PM

## 2015-04-09 ENCOUNTER — Encounter: Payer: Self-pay | Admitting: Rehabilitative and Restorative Service Providers"

## 2015-04-09 DIAGNOSIS — R293 Abnormal posture: Secondary | ICD-10-CM

## 2015-04-09 DIAGNOSIS — M6281 Muscle weakness (generalized): Secondary | ICD-10-CM

## 2015-04-09 DIAGNOSIS — R269 Unspecified abnormalities of gait and mobility: Secondary | ICD-10-CM

## 2015-04-12 ENCOUNTER — Encounter: Payer: Self-pay | Admitting: Neurology

## 2015-04-12 ENCOUNTER — Telehealth: Payer: Self-pay

## 2015-04-12 ENCOUNTER — Ambulatory Visit (INDEPENDENT_AMBULATORY_CARE_PROVIDER_SITE_OTHER): Payer: Self-pay | Admitting: Neurology

## 2015-04-12 ENCOUNTER — Ambulatory Visit: Payer: Self-pay | Admitting: Physical Therapy

## 2015-04-12 VITALS — BP 144/98 | HR 87

## 2015-04-12 DIAGNOSIS — I610 Nontraumatic intracerebral hemorrhage in hemisphere, subcortical: Secondary | ICD-10-CM

## 2015-04-12 DIAGNOSIS — G8190 Hemiplegia, unspecified affecting unspecified side: Secondary | ICD-10-CM

## 2015-04-12 DIAGNOSIS — G8110 Spastic hemiplegia affecting unspecified side: Secondary | ICD-10-CM

## 2015-04-12 MED ORDER — ASPIRIN EC 81 MG PO TBEC: 81.0000 mg | DELAYED_RELEASE_TABLET | Freq: Every day | ORAL | Status: AC

## 2015-04-12 NOTE — Progress Notes (Signed)
STROKE NEUROLOGY FOLLOW UP NOTE  NAME: Michelle Kemp DOB: 03-09-1941  REASON FOR VISIT: stroke follow up HISTORY FROM: daughter and chart  Today we had the pleasure of seeing Michelle Kemp in follow-up at our Neurology Clinic. Pt was accompanied by daughter. Daughter acted as Equities trader.    History Summary 74 yo female from [place] with hx of arthritis is following up in stroke clinic for stroke. As per daughter, she was having vacation in [place] on 11/07/14 when she developed acute onset left arm and leg weakness, left leg stiff and spastic, with facial droop, left side drooling, and slurry speech. She was sent to nearby hospital, BP 200/120 and CT head on 11/13/14 showed right frontal hemorrhage. She was treated conservatively. 12/13/14 had MRA head showed diffuse intracranial atherosclerosis. 01/11/15 repeat CT showed near resolution of ICH. She was then travelled back from [place] to [place]. She denies any smoking, alcohol or illicit drugs.  Interval History During the interval time, she has been working with PT/OT for strength training. She saw Dr. Lucia Gaskins for follow up, has ordered MRI and MRA but not done due to insurance coverage. Daughter has been working with Child psychotherapist to get her disability approved.  She still has spastic left arm and leg, but also developed left shoulder, elbow, hip and knee pain which limited her rehab process.   REVIEW OF SYSTEMS: Full 14 system review of systems performed and notable only for those listed below and in HPI above, all others are negative:  Constitutional:   Cardiovascular: Swelling in legs Ear/Nose/Throat:   Skin:  Eyes:   Respiratory:  SOB Gastroitestinal:  Constipation Genitourinary:  Hematology/Lymphatic:   Endocrine:  Musculoskeletal:  Joint pain, aching muscles Allergy/Immunology:   Neurological:   Psychiatric:  Sleep: Sleepiness, restless legs  The following represents the patient's updated allergies and side effects list: No Known  Allergies  The neurologically relevant items on the patient's problem list were reviewed on today's visit.  Neurologic Examination  A problem focused neurological exam (12 or more points of the single system neurologic examination, vital signs counts as 1 point, cranial nerves count for 8 points) was performed.  Blood pressure 144/98, pulse 87.  General - Well nourished, well developed, in no apparent distress.  Ophthalmologic - Fundi not visualized due to noncooperation.  Cardiovascular - intermittent premature beat.  Mental Status -  Level of arousal and orientation to time, place, and person were intact. Language including expression, naming, repetition, comprehension was assessed and found intact. Fund of Knowledge was assessed and was impaired.  Cranial Nerves II - XII - II - Visual field intact OU. III, IV, VI - Extraocular movements intact. V - Facial sensation intact bilaterally. VII - mild left nasolabial fold flattening. VIII - Hearing & vestibular intact bilaterally. X - Palate elevates symmetrically. XI - Chin turning & shoulder shrug intact bilaterally. XII - Tongue protrusion intact.  Motor Strength - The patient's strength was LUE 0/5 proximal and 2/5 distal, LLE 3-/5 proximal and 0/5 distally.  Bulk was normal and fasciculations were absent.   Motor Tone - Muscle tone was assessed at the neck and appendages and was significantly increased left UE and LE. Limited ROM of left shoulder and elbow. .  Reflexes - The patient's reflexes were 2+ in all extremities and she had no pathological reflexes.  Sensory - Light touch, temperature/pinprick were assessed and were normal.    Coordination - The patient had normal movements in the hands with no ataxia or dysmetria.  Tremor was absent.  Gait and Station - in wheelchair, gait not tested.  Data reviewed: I personally reviewed the images and agree with the radiology interpretations.  11/13/14 CT head - right frontal  hemorrhage with remote small left cerebellar infarct  12/13/14 MRA head - diffuse intracranial atherosclerosis  01/11/15 CT head - resolving right frontal hemorrhage with remote small left cerebellar infarct.   Assessment: As you may recall, she is a 75 y.o. African American female with PMH of arthritis had acute onset left sided weakness and dysarthria on 11/07/14. CT was done on 11/13/14 showed right frontal ICH. She was treated conservatively. MRA in 12/2014 showed diffuse atherosclerosis. Repeat CT head in 01/2015 showed near resolution of ICH with remote small left cerebellar infarct. Due to the delay of CT as well as without MRI image, it is hard to tell for sure it was ICH vs. Hemorrhagic infarct. Since the hematoma is absorbed, will start her on ASA 81. Waiting for insurance approval, will do MRI, MRA, CUS, TTE, stroke labs. Continue PT/OT and refer to Dr. Doroteo Bradford for left sided joint pain and spasticity post stroke.  Plan:  - will start ASA  for stroke prevention - will check carotid doppler and 2D echo for stroke work up - will check stroke labs. - refer to Dr. Doroteo Bradford for left sided joint pain and spasticity - continue PT/OT  - work with Child psychotherapist for insurance coverage. - Follow up with your primary care physician for stroke risk factor modification. Recommend maintain blood pressure goal <130/80, diabetes with hemoglobin A1c goal below 6.5% and lipids with LDL cholesterol goal below 70 mg/dL.  - check BP at home - RTC in 2 months  Orders Placed This Encounter  Procedures  . US Carotid Bilateral    Standing Status: Future     Number of Occurrences:      Standing Expiration Date: 06/13/2016    Order Specific Question:  Reason for Exam (SYMPTOM  OR DIAGNOSIS REQUIRED)    Answer:  stroke    Order Specific Question:  Preferred imaging location?    Answer:  Internal  . Basic metabolic panel  . Hemoglobin A1c  . Lipid panel  . Ambulatory referral to Physical Medicine Rehab     Referral Priority:  Routine    Referral Type:  Rehabilitation    Referral Reason:  Specialty Services Required    Referred to Provider:  Erick Colace, MD    Requested Specialty:  Physical Medicine and Rehabilitation    Number of Visits Requested:  1  . ECHOCARDIOGRAM COMPLETE    Standing Status: Future     Number of Occurrences:      Standing Expiration Date: 07/11/2016    Order Specific Question:  Where should this test be performed    Answer:  [facility])    Order Specific Question:  Complete or Limited study?    Answer:  Complete    Order Specific Question:  With Image Enhancing Agent or without Image Enhancing Agent?    Answer:  With Image Enhancing Agent    Order Specific Question:  Reason for exam-Echo    Answer:  Stroke  434.91 / I163.9    Meds ordered this encounter  Medications  . aspirin EC 81 MG tablet    Sig: Take 1 tablet (81 mg total) by mouth daily.    Dispense:  90 tablet    Refill:  3    Patient Instructions  - will start ASA   for stroke prevention - will check carotid doppler and 2D echo for stroke work up - will check blood tests for lipid panel and also rule out diabetes - refer to Dr. Doroteo Bradford for left side joint pain and spasticity - continue PT/OT and be aggressive - work with Child psychotherapist to get the insurance issue settled. - Follow up with your primary care physician for stroke risk factor modification. Recommend maintain blood pressure goal <130/80, diabetes with hemoglobin A1c goal below 6.5% and lipids with LDL cholesterol goal below 70 mg/dL.  - check BP at home - follow up in 2 months   Marvel Plan, MD PhD [facility] [address] [place], [place] [address] [phone]

## 2015-04-12 NOTE — Telephone Encounter (Signed)
Rn call language line at  [phone] to request the EWE language for the patient. The language line stated at this time they do not have an available interpreter that speaks that language. Rn will notified Marylu Lund of this and Dr. Colon Flattery daughter when he mother was hospitalized they could not find a interpreter who spoke it. Pt is in office today seeing Dr.Xu for follow up, and her daughter is interpreting.Michelle Kemp

## 2015-04-12 NOTE — Patient Instructions (Signed)
-   will start ASA  for stroke prevention - will check carotid doppler and 2D echo for stroke work up - will check blood tests for lipid panel and also rule out diabetes - refer to Dr. Doroteo Bradford for left side joint pain and spasticity - continue PT/OT and be aggressive - work with Child psychotherapist to get the insurance issue settled. - Follow up with your primary care physician for stroke risk factor modification. Recommend maintain blood pressure goal <130/80, diabetes with hemoglobin A1c goal below 6.5% and lipids with LDL cholesterol goal below 70 mg/dL.  - check BP at home - follow up in 2 months

## 2015-04-12 NOTE — Therapy (Signed)
[facility] [address] [place], [place], [address] Phone: [phone]   Fax:  [phone]  Patient Details  Name: Michelle Kemp MRN: [id number] Date of Birth: 1941-02-14 Referring Provider:  Dr. Sarina Ill Certification date: [id number] Encounter Date: last encounter 04/07/2015  The patient has been seen for 11/16 visits in physical therapy.  PT to continue working towards goals set below.        PT Short Term Goals - 04/09/15 1617    PT SHORT TERM GOAL #1   Title The patient will tolerate standing program with family assistance x 5 minutes for improved weight bearing and assist for ADLs.   Baseline Target date 05/09/2015   Time 4   Period Weeks   Status New   PT SHORT TERM GOAL #2   Title The patient will move sit<>supine with CGA.   Baseline Target date 05/09/2015   Time 4   Period Weeks   Status New   PT SHORT TERM GOAL #3   Title The patient will transfer w/c<>mat with sliding board with CGA of 1 person.   Baseline Target date 05/09/2015   Time 4   Period Weeks   Status New   PT SHORT TERM GOAL #4   Title The patient will peopel manual wheelchair with minimal verbal cues.   Baseline Target date 05/09/2015   Time 4   Period Weeks   Status New   PT SHORT TERM GOAL #5   Title The patient will ambulate in parallel bars with +2 assist x 5 feet to demo improved postural control and strength for standing tasks.   Baseline Target date 05/09/2015   Time 4   Period Weeks   Status New         PT Long Term Goals - 04/09/15 1620    PT LONG TERM GOAL #1   Title The patient/family will verbalize understanding of CVA warning signs/risk factors.   Baseline Target date 06/08/2015   Time 8   Period Weeks   Status On-going   PT LONG TERM GOAL #2   Title The patient will move sit<>stand with CGA of 1 person with UE support.   Baseline Target date 06/08/2015   Time 8   Period Weeks   Status New    PT LONG TERM GOAL #3   Title The patient will transfer w/c<>mat with squat pivot or boost transfer with CGA for safety.   Baseline Target date 06/08/2015   Time 8   Period Weeks   Status New   PT LONG TERM GOAL #4   Title The patient will ambulate x 10 feet with mod A of 1 person with least restrictive assistive device.   Baseline Target date 06/08/2015   Time 8   Period Weeks   Status New   PT LONG TERM GOAL #5   Title The patient and family will demo indep with HEP for post d/c plan.   Baseline Target date 06/08/2015   Time 8   Period Weeks   Status New         Plan - 04/12/15 7517    Clinical Impression Statement The patient has partially  met STGs and LTGs in P.T.  Plan to continue skilled physical therapy with emphasis on improving motor control of L LE, postural stability, standing tolerance/balance, and decreasing dependence on caregivers in her home environment.     Pt will benefit from skilled therapeutic intervention in order to improve on the following deficits  Abnormal gait;Decreased balance;Decreased mobility;Decreased strength;Postural dysfunction;Impaired flexibility;Pain;Decreased activity tolerance;Decreased endurance;Increased muscle spasms;Difficulty walking   Rehab Potential Good   Clinical Impairments Affecting Rehab Potential patient private pay for services   PT Frequency 2x / week   PT Duration 8 weeks   PT Treatment/Interventions Electrical Stimulation;Gait training;Stair training;Neuromuscular re-education;Balance training;Therapeutic exercise;Therapeutic activities;Functional mobility training;Patient/family education;Wheelchair mobility training;Manual techniques;Orthotic Fit/Training   PT Next Visit Plan Progressing towards updated STGs and LTGs with emphasis on standing tolerance, transfers, and functional mobility.     Thank you for the referral of this patient. Rudell Cobb, MPT   [place], PT 04/12/2015, 8:15 AM  [facility] [address] [place], [place], [address] Phone: [phone]   Fax:  [phone]

## 2015-04-14 ENCOUNTER — Ambulatory Visit: Payer: Self-pay | Attending: Neurology | Admitting: Physical Therapy

## 2015-04-14 ENCOUNTER — Encounter: Payer: Self-pay | Admitting: Physical Therapy

## 2015-04-14 DIAGNOSIS — M25512 Pain in left shoulder: Secondary | ICD-10-CM | POA: Insufficient documentation

## 2015-04-14 DIAGNOSIS — R2681 Unsteadiness on feet: Secondary | ICD-10-CM | POA: Insufficient documentation

## 2015-04-14 DIAGNOSIS — M6281 Muscle weakness (generalized): Secondary | ICD-10-CM | POA: Insufficient documentation

## 2015-04-14 DIAGNOSIS — R269 Unspecified abnormalities of gait and mobility: Secondary | ICD-10-CM | POA: Insufficient documentation

## 2015-04-14 DIAGNOSIS — G8110 Spastic hemiplegia affecting unspecified side: Secondary | ICD-10-CM | POA: Insufficient documentation

## 2015-04-14 DIAGNOSIS — M25612 Stiffness of left shoulder, not elsewhere classified: Secondary | ICD-10-CM | POA: Insufficient documentation

## 2015-04-14 DIAGNOSIS — R293 Abnormal posture: Secondary | ICD-10-CM | POA: Insufficient documentation

## 2015-04-15 NOTE — Therapy (Signed)
[facility] [address] [place], [place], [address] Phone: [phone]   Fax:  [phone]  Physical Therapy Treatment  Patient Details  Name: Michelle Kemp MRN: [id number] Date of Birth: 29-Apr-1941 Referring Provider:  Jackie Plum, MD  Encounter Date: 04/14/2015      PT End of Session - 04/15/15 1458    Visit Number 13   Number of Visits 32   Date for PT Re-Evaluation 06/08/15   Authorization Type Self pay, no eligibility to apply for benefits per daughter's reports   PT Start Time 1445   PT Stop Time 1530   PT Time Calculation (min) 45 min   Equipment Utilized During Treatment Gait belt   Activity Tolerance Patient tolerated treatment well   Behavior During Therapy [facility] for tasks assessed/performed      Past Medical History  Diagnosis Date  . Stroke (HCC)   . Arthritis     Past Surgical History  Procedure Laterality Date  . Hernia repair  2001    There were no vitals filed for this visit.  Visit Diagnosis:  Muscle weakness  Abnormality of gait  Postural instability  Unsteadiness on feet      Subjective Assessment - 04/14/15 1453    Subjective No      Treatment: Max assist for slide board transfer toward left side wheelchair>mat table. Cues on technique, head placement and UE/LE use with transfer.  Multiple reps of sit<>stand mat<> left platform walker performed. Max assist with facilitation for midline position as pt tends to push towards left side with standing. Pt pulling with right arm on walker with family member/PTA stabilizing it. With blocked practice this progressed to light mod assist. Pt continued to need max assist to sit safely, remove left arm from platform and control descent to mat.  The following was performed during the multiple standing trials: Right arm raises Right arm reaching to targets at various locations Balloon batting with right arm Right heel  raising Right leg stepping fwd/bwd  Pt required min guard assist with static standing at walker, min to mod assist with dynamic balance. Mirror input used to achieve midline/centered position. Total assist to position left foot/leg under pt with facilitation to shift off this leg onto her right side.   Min assist for slide board transfer mat>wheelchair toward pt's right side with cues on technique. Mod assist to position pt's hips in wheelchair once there.         PT Short Term Goals - 04/09/15 1617    PT SHORT TERM GOAL #1   Title The patient will tolerate standing program with family assistance x 5 minutes for improved weight bearing and assist for ADLs.   Baseline Target date 05/09/2015   Time 4   Period Weeks   Status New   PT SHORT TERM GOAL #2   Title The patient will move sit<>supine with CGA.   Baseline Target date 05/09/2015   Time 4   Period Weeks   Status New   PT SHORT TERM GOAL #3   Title The patient will transfer w/c<>mat with sliding board with CGA of 1 person.   Baseline Target date 05/09/2015   Time 4   Period Weeks   Status New   PT SHORT TERM GOAL #4   Title The patient will peopel manual wheelchair with minimal verbal cues.   Baseline Target date 05/09/2015   Time 4   Period Weeks   Status New   PT SHORT TERM  GOAL #5   Title The patient will ambulate in parallel bars with +2 assist x 5 feet to demo improved postural control and strength for standing tasks.   Baseline Target date 05/09/2015   Time 4   Period Weeks   Status New           PT Long Term Goals - 04/09/15 1620    PT LONG TERM GOAL #1   Title The patient/family will verbalize understanding of CVA warning signs/risk factors.   Baseline Target date 06/08/2015   Time 8   Period Weeks   Status On-going   PT LONG TERM GOAL #2   Title The patient will move sit<>stand with CGA of 1 person with UE support.   Baseline Target date 06/08/2015   Time 8   Period Weeks   Status New   PT  LONG TERM GOAL #3   Title The patient will transfer w/c<>mat with squat pivot or boost transfer with CGA for safety.   Baseline Target date 06/08/2015   Time 8   Period Weeks   Status New   PT LONG TERM GOAL #4   Title The patient will ambulate x 10 feet with mod A of 1 person with least restrictive assistive device.   Baseline Target date 06/08/2015   Time 8   Period Weeks   Status New   PT LONG TERM GOAL #5   Title The patient and family will demo indep with HEP for post d/c plan.   Baseline Target date 06/08/2015   Time 8   Period Weeks   Status New            Plan - 04/15/15 1459    Clinical Impression Statement Trialed use of platform walker today with pt able to stand and perform some movement using it today vs last time it was trialed (pt was pushing too much at that time to be safe). No buckling of left leg noted in standing, however pt does demo increased left knee flexion in stance as standing goes on.    Pt will benefit from skilled therapeutic intervention in order to improve on the following deficits Abnormal gait;Decreased balance;Decreased mobility;Decreased strength;Postural dysfunction;Impaired flexibility;Pain;Decreased activity tolerance;Decreased endurance;Increased muscle spasms;Difficulty walking   Rehab Potential Good   Clinical Impairments Affecting Rehab Potential patient private pay for services   PT Frequency 2x / week   PT Duration 8 weeks   PT Treatment/Interventions Electrical Stimulation;Gait training;Stair training;Neuromuscular re-education;Balance training;Therapeutic exercise;Therapeutic activities;Functional mobility training;Patient/family education;Wheelchair mobility training;Manual techniques;Orthotic Fit/Training   PT Next Visit Plan Progressing towards updated STGs and LTGs with emphasis on standing tolerance, transfers, and functional mobility.   Consulted and Agree with Plan of Care Patient;Family member/caregiver   Family Member  Consulted family friend        Problem List Patient Active Problem List   Diagnosis Date Noted  . Hemorrhage, intracerebral (HCC) 02/03/2015  . Hemiparesis (HCC) 02/03/2015  . Spastic hemiplegia affecting nondominant side (HCC) 02/03/2015    Sallyanne Kuster 04/15/2015, 3:03 PM  Sallyanne Kuster, PTA, [facility] Outpatient Neuro [facility] [address], [address] [place], [place] [address] [phone] 04/15/2015, 3:05 PM

## 2015-04-16 ENCOUNTER — Ambulatory Visit: Payer: Self-pay | Admitting: Physical Therapy

## 2015-04-16 DIAGNOSIS — R293 Abnormal posture: Secondary | ICD-10-CM

## 2015-04-16 DIAGNOSIS — M6281 Muscle weakness (generalized): Secondary | ICD-10-CM

## 2015-04-16 DIAGNOSIS — G8110 Spastic hemiplegia affecting unspecified side: Secondary | ICD-10-CM

## 2015-04-17 NOTE — Therapy (Signed)
[facility] [address] [place], [place], [address] Phone: [phone]   Fax:  [phone]  Physical Therapy Treatment  Patient Details  Name: Michelle Kemp MRN: [id number] Date of Birth: Nov 30, 1940 Referring Provider:  Jackie Plum, MD  Encounter Date: 04/16/2015      PT End of Session - 04/17/15 1520    Visit Number 14   Number of Visits 32   Date for PT Re-Evaluation 06/08/15   Authorization Type Self pay, no eligibility to apply for benefits per daughter's reports   PT Start Time 1537   PT Stop Time 1620   PT Time Calculation (min) 43 min   Equipment Utilized During Treatment Gait belt   Activity Tolerance Patient tolerated treatment well   Behavior During Therapy [facility] for tasks assessed/performed      Past Medical History  Diagnosis Date  . Stroke (HCC)   . Arthritis     Past Surgical History  Procedure Laterality Date  . Hernia repair  2001    There were no vitals filed for this visit.  Visit Diagnosis:  Postural instability  Spastic hemiplegia affecting nondominant side (HCC)  Muscle weakness      Subjective Assessment - 04/16/15 1636    Subjective Pt reporting no significant changes since last PT session. Accompanied by family friend to today's session.   Patient is accompained by: Interpreter   Pertinent History --   Patient Stated Goals Improve mobility and endurance   Currently in Pain? Yes   Pain Score --  unable to score; used non-verbal signs to monitor pain throughout session   Pain Location Knee   Pain Orientation Left   Pain Descriptors / Indicators Other (Comment)  uable to describe   Pain Type Chronic pain   Pain Onset 1 to 4 weeks ago   Pain Frequency Intermittent   Aggravating Factors  standing   Pain Relieving Factors rest; NWB   Multiple Pain Sites No                         [facility] Adult PT Treatment/Exercise - 04/17/15 0001    Transfers    Sit to Stand 3: Mod assist  to L PFRW   Sit to Stand Details (indicate cue type and reason) Tactile cueing at ribcage for upright posture, at L knee for proprioceptive input; tactile cueing for anterior weight shift. Verbal cueing for flexion at hips (as opposed to thoracic flexion) and for forward gaze. Required mod A but additional therapist present in front of pt providing cueing for anterior weight shift.   Stand to Sit 4: Min assist;3: Mod assist  with L PFRW   Stand to Sit Details Tactile cueing for anterior weight shift; verbal cueing for hand placement. Assist required to remove hand from L platform prior to sitting   Ambulation/Gait   Ambulation/Gait Yes   Ambulation/Gait Assistance 1: +2 Total assist;Other (comment)   Ambulation/Gait Assistance Details 3 musketeers assist provided to promote proper postural alignment.   Ambulation Distance (Feet) 8 Feet   Assistive device None  3 musketeers assist   Gait Pattern Decreased stance time - left;Step-to pattern;Decreased dorsiflexion - left;Decreased weight shift to left;Trunk rotated posteriorly on left;Left flexed knee in stance;Decreased hip/knee flexion - left   Ambulation Surface Level;Indoor   Gait Comments Required manual facilitation of lateral weight shift to L; manual stabilization of L knee during L stance; manual advancement of LLE; and facilitation at pelvis for L  pelvic protraction, B hip extension.   Wheelchair Mobility   Wheelchair Mobility No   Neuro Re-ed    Neuro Re-ed Details  Performed RUE reaching outside BOS to initiate full anterior weight shift. Progressed from anterior weight shift to partial stand. Progressed from RUE anterolateral reaching to scooting to L; utilized RUE reaching across midline to initiate scooting to R. Tactile cueing at B ribcage to promote pt actively lifting/lowering hips; at L knee for proprioceptive input; verbal cueing focused on posture, forward gaze. After performing multiple reps, pt  performing >75% of partial stand, scooting.                  PT Short Term Goals - 04/09/15 1617    PT SHORT TERM GOAL #1   Title The patient will tolerate standing program with family assistance x 5 minutes for improved weight bearing and assist for ADLs.   Baseline Target date 05/09/2015   Time 4   Period Weeks   Status New   PT SHORT TERM GOAL #2   Title The patient will move sit<>supine with CGA.   Baseline Target date 05/09/2015   Time 4   Period Weeks   Status New   PT SHORT TERM GOAL #3   Title The patient will transfer w/c<>mat with sliding board with CGA of 1 person.   Baseline Target date 05/09/2015   Time 4   Period Weeks   Status New   PT SHORT TERM GOAL #4   Title The patient will peopel manual wheelchair with minimal verbal cues.   Baseline Target date 05/09/2015   Time 4   Period Weeks   Status New   PT SHORT TERM GOAL #5   Title The patient will ambulate in parallel bars with +2 assist x 5 feet to demo improved postural control and strength for standing tasks.   Baseline Target date 05/09/2015   Time 4   Period Weeks   Status New           PT Long Term Goals - 04/09/15 1620    PT LONG TERM GOAL #1   Title The patient/family will verbalize understanding of CVA warning signs/risk factors.   Baseline Target date 06/08/2015   Time 8   Period Weeks   Status On-going   PT LONG TERM GOAL #2   Title The patient will move sit<>stand with CGA of 1 person with UE support.   Baseline Target date 06/08/2015   Time 8   Period Weeks   Status New   PT LONG TERM GOAL #3   Title The patient will transfer w/c<>mat with squat pivot or boost transfer with CGA for safety.   Baseline Target date 06/08/2015   Time 8   Period Weeks   Status New   PT LONG TERM GOAL #4   Title The patient will ambulate x 10 feet with mod A of 1 person with least restrictive assistive device.   Baseline Target date 06/08/2015   Time 8   Period Weeks   Status New   PT  LONG TERM GOAL #5   Title The patient and family will demo indep with HEP for post d/c plan.   Baseline Target date 06/08/2015   Time 8   Period Weeks   Status New               Plan - 04/17/15 1521    Clinical Impression Statement Skilled session focused on anterior weight shift, facilitation of active lifting/lowering  during sit <> partial stand and scooting. Cueing focused on postural alignment, upright posture. At end of session, pt able to perform active sit <> partial stand with min A without UE use when pt reaching for anterior target. Continue per POC.   Pt will benefit from skilled therapeutic intervention in order to improve on the following deficits Abnormal gait;Decreased balance;Decreased mobility;Decreased strength;Postural dysfunction;Impaired flexibility;Pain;Decreased activity tolerance;Decreased endurance;Increased muscle spasms;Difficulty walking   Rehab Potential Good   Clinical Impairments Affecting Rehab Potential patient private pay for services   PT Frequency 2x / week   PT Duration 8 weeks   PT Treatment/Interventions Electrical Stimulation;Gait training;Stair training;Neuromuscular re-education;Balance training;Therapeutic exercise;Therapeutic activities;Functional mobility training;Patient/family education;Wheelchair mobility training;Manual techniques;Orthotic Fit/Training   PT Next Visit Plan Progressing towards updated STGs and LTGs with emphasis on standing tolerance, transfers, and functional mobility.   Consulted and Agree with Plan of Care Patient        Problem List Patient Active Problem List   Diagnosis Date Noted  . Hemorrhage, intracerebral (HCC) 02/03/2015  . Hemiparesis (HCC) 02/03/2015  . Spastic hemiplegia affecting nondominant side (HCC) 02/03/2015    Jorje Guild, PT, DPT [facility] [address] [place], [place], [address] Phone: [phone]   Fax:  [phone] 04/17/2015, 3:28  PM

## 2015-04-19 ENCOUNTER — Encounter: Payer: Self-pay | Admitting: Occupational Therapy

## 2015-04-19 ENCOUNTER — Ambulatory Visit: Payer: Self-pay | Admitting: Physical Therapy

## 2015-04-19 ENCOUNTER — Ambulatory Visit: Payer: Self-pay | Admitting: Occupational Therapy

## 2015-04-19 DIAGNOSIS — G8110 Spastic hemiplegia affecting unspecified side: Secondary | ICD-10-CM

## 2015-04-19 DIAGNOSIS — R293 Abnormal posture: Secondary | ICD-10-CM

## 2015-04-19 NOTE — Therapy (Signed)
[facility] [address] [place], [place], [address] Phone: [phone]   Fax:  [phone]  Occupational Therapy Treatment  Patient Details  Name: Daylene Vandenbosch MRN: [id number] Date of Birth: 1941/05/23 Referring Provider:  Benito Mccreedy, MD  Encounter Date: 04/19/2015      OT End of Session - 04/19/15 1637    Visit Number 11   Number of Visits 17   Date for OT Re-Evaluation 05/20/15   Authorization Type self pay   OT Start Time 1535   OT Stop Time 1615   OT Time Calculation (min) 40 min   Activity Tolerance Patient tolerated treatment well   Behavior During Therapy [facility] for tasks assessed/performed      Past Medical History  Diagnosis Date  . Stroke ([place])   . Arthritis     Past Surgical History  Procedure Laterality Date  . Hernia repair  2001    There were no vitals filed for this visit.  Visit Diagnosis:  Postural instability - Plan: Ot plan of care cert/re-cert  Spastic hemiplegia affecting nondominant side ([place]) - Plan: Ot plan of care cert/re-cert      Subjective Assessment - 04/19/15 1632    Subjective  No pain in knees reported today.     Patient is accompained by: Family member   Currently in Pain? Yes   Pain Orientation Left   Pain Onset 1 to 4 weeks ago   Pain Frequency Intermittent   Aggravating Factors  end range   Pain Relieving Factors rest                      OT Treatments/Exercises (OP) - 04/19/15 0001    ADLs   LB Dressing Addressed static to dynamic stand balance as needed for adjusting pants, underwear.  Patient now able to reach forward to assist in doffing socks, donning shoes.  Patient able to sustain forward flexion for a sufficient time to complete these tasks.  Patient able to transition from sit to stand with UE support today with min assist to aliugn trunk in midline, for pull to stand.  Worked on controlled descent back to chair.  Also worked on  managing balance in standing without UE support.                  OT Education - 04/19/15 1636    Education provided Yes   Education Details reinforced with family the importance of stretching to reliev discomfort.  Patient has intermittent report of shoulder and elbow pain in left side, which during therapy is reduced with stretching program   Person(s) Educated Patient;Caregiver(s)   Methods Explanation   Comprehension Verbalized understanding          OT Short Term Goals - 03/31/15 1618    OT SHORT TERM GOAL #1   Title Patient will don pull over shirt without assistance (goals due 03/19/15)   Status Not Met   OT SHORT TERM GOAL #2   Title Patient will transition from sit to stand with mod assist as a precursos to eventually allow family to adjust clothing prior to and following toileting   Status Achieved   OT SHORT TERM GOAL #3   Title Patient will report pain no greater than 2-3/10 after positioning in bed by caregivers to allow her to sleep restfully   Status Achieved   OT SHORT TERM GOAL #4   Title Patient will reach forward toward lower legs and feet and sustaing  position for 10 seconds to aide with lower body bathing and dressing.   Status Achieved   OT SHORT TERM GOAL #5   Title Patient will demonstrate active grasp and release  in left hand with minimal facilitation, to aide with meal prep tasks as a precursor to simple cooking task   Status Achieved           OT Long Term Goals - 04/19/15 1644    OT LONG TERM GOAL #1   Title Patient will don lower body clothing with moderate assistance while seated, adn transition to standing to pull up clothing (goals due 10/7)   Status On-going   OT LONG TERM GOAL #2   Title Patient will transfer to commode with moderate assistance for toileting   Status On-going   OT LONG TERM GOAL #3   Title Patient will stand with no greater than min assist to allow family to complete toilet hygiene    Status Partially Met   OT  LONG TERM GOAL #4   Title Patient will be able to reposition left arm for comfort in upright and reclined positions - such that pain experienced is no greater than 2/10   Status On-going   OT LONG TERM GOAL #5   Title Patient will prepare simple meal with moderate assistance from family members   Status On-going               Plan - 04/19/15 1638    Clinical Impression Statement Patient continues to show improvement with functional mobility which should transition to reduced caregiver burden for basic self care activities.     Pt will benefit from skilled therapeutic intervention in order to improve on the following deficits (Retired) Decreased activity tolerance;Decreased balance;Decreased coordination;Decreased endurance;Impaired flexibility;Impaired perceived functional ability;Decreased safety awareness;Decreased range of motion;Decreased mobility;Decreased knowledge of use of DME;Decreased knowledge of precautions;Decreased strength;Increased edema;Increased muscle spasms;Impaired sensation;Pain;Impaired tone;Impaired UE functional use;Impaired vision/preception   Rehab Potential Good   Clinical Impairments Affecting Rehab Potential limited financial resources   OT Frequency 2x / week   OT Duration 8 weeks   OT Treatment/Interventions Self-care/ADL training;Electrical Stimulation;Cryotherapy;Moist Heat;Visual/perceptual remediation/compensation;Patient/family education;Balance training;Splinting;Therapeutic exercises;Therapeutic activities;Neuromuscular education;Functional Mobility Training;Fluidtherapy;Biofeedback;DME and/or AE instruction   Plan ADL, Shoulder range of motion with family members, sit to stand, dynamic standing, stand pivot transfer with bar   Consulted and Agree with Plan of Care Patient;Family member/caregiver        Problem List Patient Active Problem List   Diagnosis Date Noted  . Hemorrhage, intracerebral ([place]) 02/03/2015  . Hemiparesis ([place]) 02/03/2015   . Spastic hemiplegia affecting nondominant side ([place]) 02/03/2015    Mariah Milling, OTR/L 04/19/2015, 4:51 PM  [facility] [address] [place], [place], [address] Phone: [phone]   Fax:  [phone]

## 2015-04-23 ENCOUNTER — Encounter: Payer: Self-pay | Admitting: Occupational Therapy

## 2015-04-23 ENCOUNTER — Ambulatory Visit: Payer: Self-pay | Admitting: Physical Therapy

## 2015-04-23 ENCOUNTER — Encounter: Payer: Self-pay | Admitting: Physical Therapy

## 2015-04-23 ENCOUNTER — Ambulatory Visit: Payer: Self-pay | Admitting: Occupational Therapy

## 2015-04-23 DIAGNOSIS — R293 Abnormal posture: Secondary | ICD-10-CM

## 2015-04-23 DIAGNOSIS — R269 Unspecified abnormalities of gait and mobility: Secondary | ICD-10-CM

## 2015-04-23 DIAGNOSIS — M6281 Muscle weakness (generalized): Secondary | ICD-10-CM

## 2015-04-23 DIAGNOSIS — G8110 Spastic hemiplegia affecting unspecified side: Secondary | ICD-10-CM

## 2015-04-23 DIAGNOSIS — M25612 Stiffness of left shoulder, not elsewhere classified: Secondary | ICD-10-CM

## 2015-04-23 DIAGNOSIS — M25512 Pain in left shoulder: Secondary | ICD-10-CM

## 2015-04-23 DIAGNOSIS — R2681 Unsteadiness on feet: Secondary | ICD-10-CM

## 2015-04-23 NOTE — Therapy (Signed)
[facility] [address] [place], [place], [address] Phone: [phone]   Fax:  [phone]  Occupational Therapy Treatment  Patient Details  Name: Michelle Kemp MRN: [id number] Date of Birth: 1941-01-04 No Data Recorded  Encounter Date: 04/23/2015      OT End of Session - 04/23/15 1703    Visit Number 12   Number of Visits 17   Date for OT Re-Evaluation 05/20/15   Authorization Type self pay   OT Start Time 1535   OT Stop Time 1618   OT Time Calculation (min) 43 min   Activity Tolerance Patient tolerated treatment well   Behavior During Therapy [facility] for tasks assessed/performed      Past Medical History  Diagnosis Date  . Stroke ([place])   . Arthritis     Past Surgical History  Procedure Laterality Date  . Hernia repair  2001    There were no vitals filed for this visit.  Visit Diagnosis:  Spastic hemiplegia affecting nondominant side (HCC)  Postural instability  Stiffness of shoulder joint, left  Pain in joint, shoulder region, left      Subjective Assessment - 04/23/15 1650    Subjective  Patient indicated she wanted to work on standing balance today   Patient is accompained by: Family member   Currently in Pain? No/denies                      OT Treatments/Exercises (OP) - 04/23/15 0001    ADLs   Functional Mobility Addressed dynamic standing for entire session today.  Sit to stand with reduced support for alignment and postural control.  Patient has a history of knee problems, so sit to stand is difficult without UE support - doing very well with secure grab bar.  Note improved confidence and decreased assistance noted with sit to stand.  Also working with patient to control descent when transitioning from stand to sit.  Patient's improved LE strength and control is helping her to slowly lower self safely into seat.  Stand with emphasis on managing mass over base of support.   Patient has tendency to lose balance backward and to right, however, with initial guiding forward and to left, patient able to repeat this pattern safely, and even weight shift from left to right, from forward to back.  Patient with loss of balance, small, but able to identifying issue, and self correct using right UE for support.      ADL Comments [name] present for part of OT session and very encouraging to patient.  Patient's family has had great carryover of therapeutic techiniques in the home.                  OT Education - 04/23/15 1702    Education provided Yes   Education Details reinforced with patient's daughter the importance of daily, gentle stretching to left elbow/ shoulder to reduce stiffness, and pain in left extremity.     Person(s) Educated Patient;Child(ren)   Methods Explanation   Comprehension Verbalized understanding          OT Short Term Goals - 03/31/15 1618    OT SHORT TERM GOAL #1   Title Patient will don pull over shirt without assistance (goals due 03/19/15)   Status Not Met   OT SHORT TERM GOAL #2   Title Patient will transition from sit to stand with mod assist as a precursos to eventually allow family to adjust clothing prior  to and following toileting   Status Achieved   OT SHORT TERM GOAL #3   Title Patient will report pain no greater than 2-3/10 after positioning in bed by caregivers to allow her to sleep restfully   Status Achieved   OT SHORT TERM GOAL #4   Title Patient will reach forward toward lower legs and feet and sustaing position for 10 seconds to aide with lower body bathing and dressing.   Status Achieved   OT SHORT TERM GOAL #5   Title Patient will demonstrate active grasp and release  in left hand with minimal facilitation, to aide with meal prep tasks as a precursor to simple cooking task   Status Achieved           OT Long Term Goals - 04/19/15 1644    OT LONG TERM GOAL #1   Title Patient will don lower body  clothing with moderate assistance while seated, adn transition to standing to pull up clothing (goals due 10/7)   Status On-going   OT LONG TERM GOAL #2   Title Patient will transfer to commode with moderate assistance for toileting   Status On-going   OT LONG TERM GOAL #3   Title Patient will stand with no greater than min assist to allow family to complete toilet hygiene    Status Partially Met   OT LONG TERM GOAL #4   Title Patient will be able to reposition left arm for comfort in upright and reclined positions - such that pain experienced is no greater than 2/10   Status On-going   OT LONG TERM GOAL #5   Title Patient will prepare simple meal with moderate assistance from family members   Status On-going               Plan - 04/23/15 1705    Clinical Impression Statement Patient is showing steady improvement with competence, confidence with functional mobility which should transition to decreased caregiver burden for self care activities.     Pt will benefit from skilled therapeutic intervention in order to improve on the following deficits (Retired) Decreased activity tolerance;Decreased balance;Decreased coordination;Decreased endurance;Impaired flexibility;Impaired perceived functional ability;Decreased safety awareness;Decreased range of motion;Decreased mobility;Decreased knowledge of use of DME;Decreased knowledge of precautions;Decreased strength;Increased edema;Increased muscle spasms;Impaired sensation;Pain;Impaired tone;Impaired UE functional use;Impaired vision/preception   Rehab Potential Good   Clinical Impairments Affecting Rehab Potential limited financial resources   OT Frequency 2x / week   OT Duration 8 weeks   OT Treatment/Interventions Self-care/ADL training;Electrical Stimulation;Cryotherapy;Moist Heat;Visual/perceptual remediation/compensation;Patient/family education;Balance training;Splinting;Therapeutic exercises;Therapeutic activities;Neuromuscular  education;Functional Mobility Training;Fluidtherapy;Biofeedback;DME and/or AE instruction   Plan adl, shoulder range of motion, bathroom transfers   OT Home Exercise Plan on going   Consulted and Agree with Plan of Care Patient;Family member/caregiver        Problem List Patient Active Problem List   Diagnosis Date Noted  . 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 Hemiparesis ([place]) 02/03/2015  . Spastic hemiplegia affecting nondominant side ([place]) 02/03/2015    Mariah Milling, OTR/L 04/23/2015, 5:09 PM  [facility] [place] [address] [place], [place], [address] Phone: [phone]   Fax:  [phone]  Name: Michelle Kemp MRN: [id number] Date of Birth: 02/16/1941

## 2015-04-23 NOTE — Therapy (Signed)
[facility] [address] [place], [place], [address] Phone: [phone]   Fax:  [phone]  Physical Therapy Treatment  Patient Details  Name: Michelle Kemp MRN: [id number] Date of Birth: [birth year] No Data Recorded  Encounter Date: 04/23/2015    04/23/15 1457  PT Visits / Re-Eval  Visit Number 15  Number of Visits 32  Date for PT Re-Evaluation 06/08/15  Authorization  Authorization Type Self pay, no eligibility to apply for benefits per daughter's reports  PT Time Calculation  PT Start Time 1447  PT Stop Time 1530  PT Time Calculation (min) 43 min  PT - End of Session  Equipment Utilized During Treatment Gait belt  Activity Tolerance Patient tolerated treatment well  Behavior During Therapy [facility] for tasks assessed/performed    Past Medical History  Diagnosis Date  . Stroke (HCC)   . Arthritis     Past Surgical History  Procedure Laterality Date  . Hernia repair  2001    There were no vitals filed for this visit.  Visit Diagnosis:   Postural instability     Abnormality of gait    Unsteadiness on feet   .  Muscle weakness   .  Spastic hemiplegia affecting nondominant side (HCC)        Subjective Assessment - 04/23/15 1455    Subjective No new complaints. No falls to report. Some left shoulder pain. Pt was able to stand pivot with [place] Distance to get into lower car today to come to therapy (did not need lift).   Currently in Pain? Yes   Pain Score 8    Pain Location Shoulder   Pain Orientation Left   Pain Descriptors / Indicators --  "like the bone hurts", "popping"   Pain Type Chronic pain   Pain Onset 1 to 4 weeks ago   Pain Frequency Intermittent   Aggravating Factors  movement   Pain Relieving Factors rest     treatment: Stand pivot wheelchair to mat table mod assist with cues on technique.  Sit<>stand mat<>PFRW max assist to stand, mod assist to sit safelty and controlled. Performed  multiple reps during session. While standing: min to max assist for balance and midline position - focused on midline standing with equal weight bearing - reaching with right UE all directions - stepping fwd/bwd with right foot - toe taps to 4 inch box with right foot   modified long arc quad with NMES, EMPI unit large muscle,  intensity to tolerance to left quads x 10 minutes. AArom to perform exercise with minimal muscle activation noted.         PT Short Term Goals - 04/09/15 1617    PT SHORT TERM GOAL #1   Title The patient will tolerate standing program with family assistance x 5 minutes for improved weight bearing and assist for ADLs.   Baseline Target date 05/09/2015   Time 4   Period Weeks   Status New   PT SHORT TERM GOAL #2   Title The patient will move sit<>supine with CGA.   Baseline Target date 05/09/2015   Time 4   Period Weeks   Status New   PT SHORT TERM GOAL #3   Title The patient will transfer w/c<>mat with sliding board with CGA of 1 person.   Baseline Target date 05/09/2015   Time 4   Period Weeks   Status New   PT SHORT TERM GOAL #4   Title The patient will peopel manual wheelchair with  minimal verbal cues.   Baseline Target date 05/09/2015   Time 4   Period Weeks   Status New   PT SHORT TERM GOAL #5   Title The patient will ambulate in parallel bars with +2 assist x 5 feet to demo improved postural control and strength for standing tasks.   Baseline Target date 05/09/2015   Time 4   Period Weeks   Status New           PT Long Term Goals - 04/09/15 1620    PT LONG TERM GOAL #1   Title The patient/family will verbalize understanding of CVA warning signs/risk factors.   Baseline Target date 06/08/2015   Time 8   Period Weeks   Status On-going   PT LONG TERM GOAL #2   Title The patient will move sit<>stand with CGA of 1 person with UE support.   Baseline Target date 06/08/2015   Time 8   Period Weeks   Status New   PT LONG TERM GOAL  #3   Title The patient will transfer w/c<>mat with squat pivot or boost transfer with CGA for safety.   Baseline Target date 06/08/2015   Time 8   Period Weeks   Status New   PT LONG TERM GOAL #4   Title The patient will ambulate x 10 feet with mod A of 1 person with least restrictive assistive device.   Baseline Target date 06/08/2015   Time 8   Period Weeks   Status New   PT LONG TERM GOAL #5   Title The patient and family will demo indep with HEP for post d/c plan.   Baseline Target date 06/08/2015   Time 8   Period Weeks   Status New        04/23/15 1458  Plan  Clinical Impression Statement Continued to work on transfers, standing balance and strengthening. Pt making steady progress toward goals.  Pt will benefit from skilled therapeutic intervention in order to improve on the following deficits Abnormal gait;Decreased balance;Decreased mobility;Decreased strength;Postural dysfunction;Impaired flexibility;Pain;Decreased activity tolerance;Decreased endurance;Increased muscle spasms;Difficulty walking  Rehab Potential Good  Clinical Impairments Affecting Rehab Potential patient private pay for services  PT Frequency 2x / week  PT Duration 8 weeks  PT Treatment/Interventions Electrical Stimulation;Gait training;Stair training;Neuromuscular re-education;Balance training;Therapeutic exercise;Therapeutic activities;Functional mobility training;Patient/family education;Wheelchair mobility training;Manual techniques;Orthotic Fit/Training  PT Next Visit Plan Progressing towards updated STGs and LTGs with emphasis on standing tolerance, transfers, and functional mobility.  Consulted and Agree with Plan of Care Patient    Problem List Patient Active Problem List   Diagnosis Date Noted  . Hemorrhage, intracerebral (HCC) 02/03/2015  . Hemiparesis (HCC) 02/03/2015  . Spastic hemiplegia affecting nondominant side (HCC) 02/03/2015    Sallyanne KusterBury, Avant Printy 04/23/2015, 2:59 PM  Sallyanne KusterKathy Daniil Labarge, PTA,  [facility] [address], [address] [place], [place] [address] [phone] 04/25/2015, 11:05 PM   Name: Michelle Kemp MRN: [id number] Date of Birth: 06/13/1941

## 2015-04-26 ENCOUNTER — Ambulatory Visit: Payer: Self-pay | Admitting: Physical Therapy

## 2015-04-26 ENCOUNTER — Ambulatory Visit: Payer: Self-pay | Admitting: Occupational Therapy

## 2015-04-26 ENCOUNTER — Encounter: Payer: Self-pay | Admitting: Physical Therapy

## 2015-04-26 DIAGNOSIS — M6281 Muscle weakness (generalized): Secondary | ICD-10-CM

## 2015-04-26 DIAGNOSIS — R2681 Unsteadiness on feet: Secondary | ICD-10-CM

## 2015-04-26 DIAGNOSIS — M25512 Pain in left shoulder: Secondary | ICD-10-CM

## 2015-04-26 DIAGNOSIS — R293 Abnormal posture: Secondary | ICD-10-CM

## 2015-04-26 DIAGNOSIS — M25612 Stiffness of left shoulder, not elsewhere classified: Secondary | ICD-10-CM

## 2015-04-26 DIAGNOSIS — R269 Unspecified abnormalities of gait and mobility: Secondary | ICD-10-CM

## 2015-04-26 NOTE — Therapy (Signed)
[facility] [address] [place], [place], [address] Phone: [phone]   Fax:  [phone]  Physical Therapy Treatment  Patient Details  Name: Michelle Kemp MRN: [id number] Date of Birth: 08/15/1940 No Data Recorded  Encounter Date: 04/26/2015      PT End of Session - 04/26/15 1455    Visit Number 16   Number of Visits 32   Date for PT Re-Evaluation 06/08/15   Authorization Type Self pay, no eligibility to apply for benefits per daughter's reports   PT Start Time 1448   PT Stop Time 1530   PT Time Calculation (min) 42 min   Equipment Utilized During Treatment Gait belt   Activity Tolerance Patient tolerated treatment well   Behavior During Therapy [facility] for tasks assessed/performed      Past Medical History  Diagnosis Date  . Stroke (HCC)   . Arthritis     Past Surgical History  Procedure Laterality Date  . Hernia repair  2001    There were no vitals filed for this visit.  Visit Diagnosis:  Abnormality of gait  Unsteadiness on feet  Muscle weakness      Subjective Assessment - 04/26/15 1454    Subjective No new complaints. No falls to report. No pain today as well.   Currently in Pain? No/denies   Pain Score 0-No pain           [facility] Adult PT Treatment/Exercise - 04/26/15 1506    Transfers   Sit to Stand 4: Min assist;3: Mod assist;With armrests;From bed;From chair/3-in-1   Stand to Sit 4: Min assist;3: Mod assist;With armrests;To chair/3-in-1;To bed   Stand Pivot Transfers 2: Max assist  w/c to mat table   Ambulation/Gait   Ambulation/Gait Yes   Ambulation/Gait Assistance 3: Mod assist  2 person assist   Ambulation Distance (Feet) 14 Feet  x1; 25 x1   Assistive device Left platform walker   Ambulation Surface Level;Indoor     standing at sink:  - reaching with right arm up into cabinet above head and to left of pt to retrieve items and the put items back. Min assist for balance  with multimodal cues on left knee extension/stability and for upright posture. Added standing at sink to pt's HEP.         PT Short Term Goals - 04/09/15 1617    PT SHORT TERM GOAL #1   Title The patient will tolerate standing program with family assistance x 5 minutes for improved weight bearing and assist for ADLs.   Baseline Target date 05/09/2015   Time 4   Period Weeks   Status New   PT SHORT TERM GOAL #2   Title The patient will move sit<>supine with CGA.   Baseline Target date 05/09/2015   Time 4   Period Weeks   Status New   PT SHORT TERM GOAL #3   Title The patient will transfer w/c<>mat with sliding board with CGA of 1 person.   Baseline Target date 05/09/2015   Time 4   Period Weeks   Status New   PT SHORT TERM GOAL #4   Title The patient will peopel manual wheelchair with minimal verbal cues.   Baseline Target date 05/09/2015   Time 4   Period Weeks   Status New   PT SHORT TERM GOAL #5   Title The patient will ambulate in parallel bars with +2 assist x 5 feet to demo improved postural control and strength for standing  tasks.   Baseline Target date 05/09/2015   Time 4   Period Weeks   Status New           PT Long Term Goals - 04/09/15 1620    PT LONG TERM GOAL #1   Title The patient/family will verbalize understanding of CVA warning signs/risk factors.   Baseline Target date 06/08/2015   Time 8   Period Weeks   Status On-going   PT LONG TERM GOAL #2   Title The patient will move sit<>stand with CGA of 1 person with UE support.   Baseline Target date 06/08/2015   Time 8   Period Weeks   Status New   PT LONG TERM GOAL #3   Title The patient will transfer w/c<>mat with squat pivot or boost transfer with CGA for safety.   Baseline Target date 06/08/2015   Time 8   Period Weeks   Status New   PT LONG TERM GOAL #4   Title The patient will ambulate x 10 feet with mod A of 1 person with least restrictive assistive device.   Baseline Target date  06/08/2015   Time 8   Period Weeks   Status New   PT LONG TERM GOAL #5   Title The patient and family will demo indep with HEP for post d/c plan.   Baseline Target date 06/08/2015   Time 8   Period Weeks   Status New           Plan - 04/26/15 1455    Clinical Impression Statement Pt able to progress gait today with use of platform RW. Less assistance needed vs last gait attempt wtih [employer] style (few visits ago). Pt making steady progress toward goals.   Pt will benefit from skilled therapeutic intervention in order to improve on the following deficits Abnormal gait;Decreased balance;Decreased mobility;Decreased strength;Postural dysfunction;Impaired flexibility;Pain;Decreased activity tolerance;Decreased endurance;Increased muscle spasms;Difficulty walking   Rehab Potential Good   Clinical Impairments Affecting Rehab Potential patient private pay for services   PT Frequency 2x / week   PT Duration 8 weeks   PT Treatment/Interventions Electrical Stimulation;Gait training;Stair training;Neuromuscular re-education;Balance training;Therapeutic exercise;Therapeutic activities;Functional mobility training;Patient/family education;Wheelchair mobility training;Manual techniques;Orthotic Fit/Training   PT Next Visit Plan Progressing towards updated STGs and LTGs with emphasis on standing tolerance, transfers, and functional mobility.   Consulted and Agree with Plan of Care Patient        Problem List Patient Active Problem List   Diagnosis Date Noted  . Hemorrhage, intracerebral (HCC) 02/03/2015  . Hemiparesis (HCC) 02/03/2015  . Spastic hemiplegia affecting nondominant side (HCC) 02/03/2015    Sallyanne Kuster 04/27/2015, 12:02 PM  Sallyanne Kuster, PTA, [facility] Outpatient Neuro [facility] [address] [place], [place] [address] [phone] 04/27/2015, 12:02 PM   Name: Cammi Consalvo MRN: [id number] Date of Birth: [birth year]

## 2015-04-27 ENCOUNTER — Encounter: Payer: Self-pay | Admitting: Occupational Therapy

## 2015-04-27 NOTE — Therapy (Signed)
[facility] [address] [place], [place], [address] Phone: [phone]   Fax:  [phone]  Occupational Therapy Treatment  Patient Details  Name: Michelle Kemp MRN: [id number] Date of Birth: [birth year] No Data Recorded  Encounter Date: 04/26/2015      OT End of Session - 04/27/15 0756    Visit Number 13   Number of Visits 17   Date for OT Re-Evaluation 05/20/15   Authorization Type self pay   OT Start Time 1532   OT Stop Time 1615   OT Time Calculation (min) 43 min   Equipment Utilized During Treatment UE Ranger   Activity Tolerance Patient tolerated treatment well   Behavior During Therapy [facility] for tasks assessed/performed      Past Medical History  Diagnosis Date  . Stroke ([place])   . Arthritis     Past Surgical History  Procedure Laterality Date  . Hernia repair  2001    There were no vitals filed for this visit.  Visit Diagnosis:  Postural instability  Unsteadiness on feet  Pain in joint, shoulder region, left  Stiffness of shoulder joint, left      Subjective Assessment - 04/27/15 0747    Subjective  Patient indicates through family that she knows she is getting stronger.   Patient is accompained by: Family member   Currently in Pain? No/denies   Pain Score 0-No pain                      OT Treatments/Exercises (OP) - 04/27/15 0001    ADLs   Toileting Addressed stand step transfer in bathroom with grab bar and raised seat with handles.  Patient able to effectively use grab abr to transiton to standing with moderate to minimal assistance.  Once standing, patient needed mod cueing / facilitation to weight shift onto left leg to allow right leg to step, pivot.  Patient able to follwo this pattern of weight shift with repetition and min assist.  Patient initially needed left foot guided through stepping motion, but after repetition able to adduct left leg with decreased guidance.   Patient with improved postural control during sit to stand and stand transitions.  Also noted improved control with stand to sit.     Neurological Re-education Exercises   Other Exercises 1 worked today to encourage sit to stand with decreased reliance on upper extremities to pull self up.  Patient able to stand with max assist using only straight back chair for UE support.  Patient needed increased facilitation for sufficient forward trunk weight shift during this task, and correct alignment of base of support each time.     Other Exercises 2 In sidelying worked on scapular mobilization and glenohumeral joint motion.  Patient with report of shoulder discomfort with range of motion initially, however, with gentle supported movement, reported no further pain.                  OT Education - 04/27/15 0756    Education provided Yes   Education Details stand step transfers vs squat pivot transfers   Person(s) Educated Patient   Methods Explanation;Demonstration   Comprehension Tactile cues required;Need further instruction          OT Short Term Goals - 03/31/15 1618    OT SHORT TERM GOAL #1   Title Patient will don pull over shirt without assistance (goals due 03/19/15)   Status Not Met   OT SHORT TERM GOAL #  2   Title Patient will transition from sit to stand with mod assist as a precursos to eventually allow family to adjust clothing prior to and following toileting   Status Achieved   OT SHORT TERM GOAL #3   Title Patient will report pain no greater than 2-3/10 after positioning in bed by caregivers to allow her to sleep restfully   Status Achieved   OT SHORT TERM GOAL #4   Title Patient will reach forward toward lower legs and feet and sustaing position for 10 seconds to aide with lower body bathing and dressing.   Status Achieved   OT SHORT TERM GOAL #5   Title Patient will demonstrate active grasp and release  in left hand with minimal facilitation, to aide with meal prep  tasks as a precursor to simple cooking task   Status Achieved           OT Long Term Goals - 04/19/15 1644    OT LONG TERM GOAL #1   Title Patient will don lower body clothing with moderate assistance while seated, adn transition to standing to pull up clothing (goals due 10/7)   Status On-going   OT LONG TERM GOAL #2   Title Patient will transfer to commode with moderate assistance for toileting   Status On-going   OT LONG TERM GOAL #3   Title Patient will stand with no greater than min assist to allow family to complete toilet hygiene    Status Partially Met   OT LONG TERM GOAL #4   Title Patient will be able to reposition left arm for comfort in upright and reclined positions - such that pain experienced is no greater than 2/10   Status On-going   OT LONG TERM GOAL #5   Title Patient will prepare simple meal with moderate assistance from family members   Status On-going               Plan - 04/27/15 0757    Clinical Impression Statement Patient continues to show steady improvement with functional mobiliuty.  Patient with continued report of shoulder and elbow discomfort.     Pt will benefit from skilled therapeutic intervention in order to improve on the following deficits (Retired) Decreased activity tolerance;Decreased balance;Decreased coordination;Decreased endurance;Impaired flexibility;Impaired perceived functional ability;Decreased safety awareness;Decreased range of motion;Decreased mobility;Decreased knowledge of use of DME;Decreased knowledge of precautions;Decreased strength;Increased edema;Increased muscle spasms;Impaired sensation;Pain;Impaired tone;Impaired UE functional use;Impaired vision/preception   Rehab Potential Good   Clinical Impairments Affecting Rehab Potential limited financial resources   OT Frequency 2x / week   OT Duration 8 weeks   OT Treatment/Interventions Self-care/ADL training;Electrical Stimulation;Cryotherapy;Moist Heat;Visual/perceptual  remediation/compensation;Patient/family education;Balance training;Splinting;Therapeutic exercises;Therapeutic activities;Neuromuscular education;Functional Mobility Training;Fluidtherapy;Biofeedback;DME and/or AE instruction   Plan ADL, Home stretching program - review with daughter if available, functional mobilty, dynamic standing, stand step transfers   OT Home Exercise Plan on going   Consulted and Agree with Plan of Care Patient;Family member/caregiver        Problem List Patient Active Problem List   Diagnosis Date Noted  . 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 Hemiparesis ([place]) 02/03/2015  . Spastic hemiplegia affecting nondominant side ([place]) 02/03/2015    Mariah Milling, OTR/L 04/27/2015, 8:00 AM  [facility] [address] [place], [place], [address] Phone: [phone]   Fax:  [phone]  Name: Zaylia Riolo MRN: [id number] Date of Birth: 31-May-1941

## 2015-04-28 ENCOUNTER — Ambulatory Visit: Payer: Self-pay | Admitting: Occupational Therapy

## 2015-04-28 DIAGNOSIS — G8110 Spastic hemiplegia affecting unspecified side: Secondary | ICD-10-CM

## 2015-04-28 DIAGNOSIS — R2681 Unsteadiness on feet: Secondary | ICD-10-CM

## 2015-04-28 DIAGNOSIS — M25612 Stiffness of left shoulder, not elsewhere classified: Secondary | ICD-10-CM

## 2015-04-29 ENCOUNTER — Encounter: Payer: Self-pay | Admitting: Occupational Therapy

## 2015-04-29 NOTE — Therapy (Signed)
[facility] [address] [place], [place], [address] Phone: [phone]   Fax:  [phone]  Occupational Therapy Treatment  Patient Details  Name: Michelle Kemp MRN: [id number] Date of Birth: 06-24-1941 Referring Provider: Sarina Ill  Encounter Date: 04/28/2015      OT End of Session - 04/29/15 1029    Visit Number 14   Number of Visits 17   Date for OT Re-Evaluation 05/20/15   Authorization Type self pay   OT Start Time 1533   OT Stop Time 1615   OT Time Calculation (min) 42 min   Activity Tolerance Patient tolerated treatment well   Behavior During Therapy [facility] for tasks assessed/performed      Past Medical History  Diagnosis Date  . Stroke ([place])   . Arthritis     Past Surgical History  Procedure Laterality Date  . Hernia repair  2001    There were no vitals filed for this visit.  Visit Diagnosis:  Unsteadiness on feet  Stiffness of shoulder joint, left  Spastic hemiplegia affecting nondominant side (HCC)      Subjective Assessment - 04/29/15 1019    Subjective  Patient indicates soreness in her left leg today, yet opts to work in standing - most important goal for her.   Patient is accompained by: Family member   Currently in Pain? Yes   Pain Score 2    Pain Location Leg   Pain Orientation Left   Pain Descriptors / Indicators Aching   Pain Type Chronic pain   Pain Onset More than a month ago   Pain Frequency Intermittent   Aggravating Factors  weight bearing, weight shifting onto left leg   Pain Relieving Factors rest   Effect of Pain on Daily Activities does not limit her desire to stand this session            [facility] OT Assessment - 04/29/15 0001    Assessment   Referring Provider Sarina Ill                  OT Treatments/Exercises (OP) - 04/29/15 0001    ADLs   Toileting Addressed pulling to stand, and stading with decreased reliance on UE's for power.   Working to improve dynamic standing to increase functional ability with lower body dressing, toileting, hygiene, etc.  Family reports significant steady improvement with transfers to and from car as stand pivot method.     Cooking Practiced standing at sink to wash dishes.  Patient needed constant cureing  and hand over hand guidance to incorporate left hand into this functional activity.  Patient initially hesitant to release right hand from support to participate in task, and had several losses of balance backward.  However, after initial facilitation, patient able to balance center of mass over base of support sufficiently to complete this task - at times with no additional physical support!    Neurological Re-education Exercises   Other Exercises 1 Worked with patient to activate isolated muscle activity in left arm - shoulder flexion, elbow flexion/extension, forearm pronation / supination, present follwoing facilitation.  Patient has limited ability to access wrist flexion / extension , or digit extension at this time.  Patient needs max cueing to attend to and attempt to volitionally control movement in left arm.                 OT Education - 04/29/15 1027    Education provided Yes   Education Details finding  balance before performing function uin standing   Person(s) Educated Patient;Caregiver(s)   Methods Explanation;Demonstration   Comprehension Verbal cues required;Tactile cues required          OT Short Term Goals - 03/31/15 1618    OT SHORT TERM GOAL #1   Title Patient will don pull over shirt without assistance (goals due 03/19/15)   Status Not Met   OT SHORT TERM GOAL #2   Title Patient will transition from sit to stand with mod assist as a precursos to eventually allow family to adjust clothing prior to and following toileting   Status Achieved   OT SHORT TERM GOAL #3   Title Patient will report pain no greater than 2-3/10 after positioning in bed by caregivers to  allow her to sleep restfully   Status Achieved   OT SHORT TERM GOAL #4   Title Patient will reach forward toward lower legs and feet and sustaing position for 10 seconds to aide with lower body bathing and dressing.   Status Achieved   OT SHORT TERM GOAL #5   Title Patient will demonstrate active grasp and release  in left hand with minimal facilitation, to aide with meal prep tasks as a precursor to simple cooking task   Status Achieved           OT Long Term Goals - 04/29/15 1031    OT LONG TERM GOAL #2   Title Patient will transfer to commode with moderate assistance for toileting   Status Achieved   OT LONG TERM GOAL #3   Title Patient will stand with no greater than min assist to allow family to complete toilet hygiene    Status Achieved               Plan - 04/29/15 1030    Clinical Impression Statement Patient showing steady improvement with functional mobility.  Patient has active movement in left shoulder, elbow, forearm, and finger flexors, yet has very difficult time incorporating this into meaningful function.     Pt will benefit from skilled therapeutic intervention in order to improve on the following deficits (Retired) Decreased activity tolerance;Decreased balance;Decreased coordination;Decreased endurance;Impaired flexibility;Impaired perceived functional ability;Decreased safety awareness;Decreased range of motion;Decreased mobility;Decreased knowledge of use of DME;Decreased knowledge of precautions;Decreased strength;Increased edema;Increased muscle spasms;Impaired sensation;Pain;Impaired tone;Impaired UE functional use;Impaired vision/preception   Rehab Potential Good   Clinical Impairments Affecting Rehab Potential limited financial resources   OT Frequency 2x / week   OT Duration 8 weeks   OT Treatment/Interventions Self-care/ADL training;Electrical Stimulation;Cryotherapy;Moist Heat;Visual/perceptual remediation/compensation;Patient/family  education;Balance training;Splinting;Therapeutic exercises;Therapeutic activities;Neuromuscular education;Functional Mobility Training;Fluidtherapy;Biofeedback;DME and/or AE instruction   Plan ADL, NMR left UE in function, functional mobility   OT Home Exercise Plan on going   Consulted and Agree with Plan of Care Patient;Family member/caregiver   Family Member Consulted granddaughter        Problem List Patient Active Problem List   Diagnosis Date Noted  . 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 Hemiparesis ([place]) 02/03/2015  . Spastic hemiplegia affecting nondominant side ([place]) 02/03/2015    Mariah Milling, OTR/L 04/29/2015, 10:38 AM  [facility] [address] [place], [place], [address] Phone: [phone]   Fax:  [phone]  Name: Nadezhda Pollitt MRN: [id number] Date of Birth: 02/28/1941

## 2015-05-05 ENCOUNTER — Ambulatory Visit: Payer: Self-pay | Admitting: Occupational Therapy

## 2015-05-05 ENCOUNTER — Encounter: Payer: Self-pay | Admitting: Occupational Therapy

## 2015-05-05 ENCOUNTER — Ambulatory Visit: Payer: Self-pay | Admitting: Physical Therapy

## 2015-05-05 ENCOUNTER — Encounter: Payer: Self-pay | Admitting: Physical Therapy

## 2015-05-05 DIAGNOSIS — M25612 Stiffness of left shoulder, not elsewhere classified: Secondary | ICD-10-CM

## 2015-05-05 DIAGNOSIS — M6281 Muscle weakness (generalized): Secondary | ICD-10-CM

## 2015-05-05 DIAGNOSIS — R293 Abnormal posture: Secondary | ICD-10-CM

## 2015-05-05 DIAGNOSIS — G8110 Spastic hemiplegia affecting unspecified side: Secondary | ICD-10-CM

## 2015-05-05 DIAGNOSIS — R269 Unspecified abnormalities of gait and mobility: Secondary | ICD-10-CM

## 2015-05-05 DIAGNOSIS — R2681 Unsteadiness on feet: Secondary | ICD-10-CM

## 2015-05-05 NOTE — Therapy (Signed)
[facility] [address] [place], [place], [address] Phone: [phone]   Fax:  [phone]  Occupational Therapy Treatment  Patient Details  Name: Michelle Kemp MRN: [id number] Date of Birth: [birth year] Referring Provider: Sarina Ill  Encounter Date: 05/05/2015      OT End of Session - 05/05/15 1709    Visit Number 15   Number of Visits 17   Date for OT Re-Evaluation 05/20/15   Authorization Type self pay   OT Start Time 1447   OT Stop Time 1530   OT Time Calculation (min) 43 min   Equipment Utilized During Treatment UE Ranger   Activity Tolerance Patient tolerated treatment well   Behavior During Therapy [facility] for tasks assessed/performed      Past Medical History  Diagnosis Date  . Stroke ([place])   . Arthritis     Past Surgical History  Procedure Laterality Date  . Hernia repair  2001    There were no vitals filed for this visit.  Visit Diagnosis:  Unsteadiness on feet  Stiffness of shoulder joint, left  Spastic hemiplegia affecting nondominant side (HCC)  Postural instability      Subjective Assessment - 05/05/15 1659    Subjective  If my leg were stronger I could stand 10 hours   Currently in Pain? No/denies   Pain Score 0-No pain                      OT Treatments/Exercises (OP) - 05/05/15 1700    ADLs   Functional Mobility Worked on increasing activation in LE's for sit to stand transition, activation of left leg in standing, and active weight shifting toward left while standing.  Patient able to stand fully unsupported for more than 5 seconds, even managing small shuffling steps with right foot.  Dynamic standing more challengin, e.g. standing and turning trunk, head to look over either shoulder.  Patient continues with strong posterior preference, but is more quickly able to identify balanced midline and sustain.  From elevated mat table, patient able to transition from  sit to stand with only incidental physical cueing.     Neurological Re-education Exercises   Other Exercises 1 Neuromuscular reeducation to left upper extremity to help reduce muscle overactivation in shoulder, elbow, and forearm.  With increased emphasis on weight shift biased toward left side, patient able to allow passive movement in left shoulder at [facility] joint.  Patient then able to follow with slight movements into shoulder flexion and extension on supportive surface (guided movement)  Patient able to see left arm movement and was encouraged.  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                  OT Education - 05/05/15 1708    Education provided Yes   Education Details finding balance in standing, static to dynamic standing   Person(s) Educated Patient;Caregiver(s)   Methods Explanation;Demonstration;Verbal cues;Tactile cues   Comprehension Verbal cues required;Tactile cues required          OT Short Term Goals - 03/31/15 1618    OT SHORT TERM GOAL #1   Title Patient will don pull over shirt without assistance (goals due 03/19/15)   Status Not Met   OT SHORT TERM GOAL #2   Title Patient will transition from sit to stand with mod assist as a precursos to eventually allow family to adjust  clothing prior to and following toileting   Status Achieved   OT SHORT TERM GOAL #3   Title Patient will report pain no greater than 2-3/10 after positioning in bed by caregivers to allow her to sleep restfully   Status Achieved   OT SHORT TERM GOAL #4   Title Patient will reach forward toward lower legs and feet and sustaing position for 10 seconds to aide with lower body bathing and dressing.   Status Achieved   OT SHORT TERM GOAL #5   Title Patient will demonstrate active grasp and release  in left hand with minimal facilitation, to aide with meal prep tasks as a precursor to simple cooking task    Status Achieved           OT Long Term Goals - 05/05/15 1710    OT LONG TERM GOAL #1   Title Patient will don lower body clothing with moderate assistance while seated, adn transition to standing to pull up clothing (goals due 10/7)   Status On-going   OT LONG TERM GOAL #2   Title Patient will transfer to commode with moderate assistance for toileting   Status Achieved   OT LONG TERM GOAL #3   Title Patient will stand with no greater than min assist to allow family to complete toilet hygiene    Status Achieved   OT LONG TERM GOAL #4   Title Patient will be able to reposition left arm for comfort in upright and reclined positions - such that pain experienced is no greater than 2/10   Status On-going   OT LONG TERM GOAL #5   Title Patient will prepare simple meal with moderate assistance from family members   Status On-going               Plan - 05/05/15 1709    Clinical Impression Statement Patient continues to show steady improvement with functional mobility.  Patient is gaining confidence with transitional movements at home per family.   Pt will benefit from skilled therapeutic intervention in order to improve on the following deficits (Retired) Decreased activity tolerance;Decreased balance;Decreased coordination;Decreased endurance;Impaired flexibility;Impaired perceived functional ability;Decreased safety awareness;Decreased range of motion;Decreased mobility;Decreased knowledge of use of DME;Decreased knowledge of precautions;Decreased strength;Increased edema;Increased muscle spasms;Impaired sensation;Pain;Impaired tone;Impaired UE functional use;Impaired vision/preception   Clinical Impairments Affecting Rehab Potential limited financial resources   OT Frequency 2x / week   OT Duration 8 weeks   OT Treatment/Interventions Self-care/ADL training;Electrical Stimulation;Cryotherapy;Moist Heat;Visual/perceptual remediation/compensation;Patient/family education;Balance  training;Splinting;Therapeutic exercises;Therapeutic activities;Neuromuscular education;Functional Mobility Training;Fluidtherapy;Biofeedback;DME and/or AE instruction   Plan functional mobility, ADL, nmr   OT Home Exercise Plan on going   Consulted and Agree with Plan of Care Patient;Family member/caregiver        Problem List Patient Active Problem List   Diagnosis Date Noted  . 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 Hemiparesis ([place]) 02/03/2015  . Spastic hemiplegia affecting nondominant side ([place]) 02/03/2015    Mariah Milling, OTR/L 05/05/2015, 5:12 PM  [facility] [address] [place], [place], [address] Phone: [phone]   Fax:  [phone]  Name: Michelle Kemp MRN: [id number] Date of Birth: 10-02-1940

## 2015-05-06 NOTE — Therapy (Signed)
[facility] [address] [place], [place], [address] Phone: [phone]   Fax:  [phone]  Physical Therapy Treatment  Patient Details  Name: Michelle Kemp MRN: [id number] Date of Birth: March 27, 1941 No Data Recorded  Encounter Date: 05/05/2015      PT End of Session - 05/05/15 1534    Visit Number 17   Number of Visits 32   Date for PT Re-Evaluation 06/08/15   Authorization Type Self pay, no eligibility to apply for benefits per daughter's reports   PT Start Time 1531   PT Stop Time 1615   PT Time Calculation (min) 44 min   Equipment Utilized During Treatment Gait belt   Activity Tolerance Patient tolerated treatment well   Behavior During Therapy [facility] for tasks assessed/performed      Past Medical History  Diagnosis Date  . Stroke (HCC)   . Arthritis     Past Surgical History  Procedure Laterality Date  . Hernia repair  2001    There were no vitals filed for this visit.  Visit Diagnosis:  Unsteadiness on feet  Spastic hemiplegia affecting nondominant side (HCC)  Postural instability  Abnormality of gait  Muscle weakness      Subjective Assessment - 05/05/15 1533    Subjective No new complaints. No falls to report.    Currently in Pain? Yes   Pain Score 4    Pain Location Generalized   Pain Descriptors / Indicators Aching;Sore   Pain Type Chronic pain   Pain Onset More than a month ago   Pain Frequency Intermittent   Aggravating Factors  increased activity, weight bearing   Pain Relieving Factors rest           [facility] Adult PT Treatment/Exercise - 05/05/15 1556    Transfers   Sit to Stand 3: Mod assist;With upper extremity assist;From bed  with pt pushing on mat (vs pulling up)   Stand to Sit 4: Min assist;3: Mod assist;With armrests;To chair/3-in-1;To bed   Ambulation/Gait   Ambulation/Gait Yes   Ambulation/Gait Assistance 3: Mod assist  varied between 1-2 person assist with  chair follow   Ambulation Distance (Feet) 20 Feet  x1; 26 x1;    Assistive device Left platform walker   Gait Pattern Decreased stance time - left;Step-to pattern;Decreased dorsiflexion - left;Decreased weight shift to left;Trunk rotated posteriorly on left;Left flexed knee in stance;Decreased hip/knee flexion - left   Ambulation Surface Level;Indoor     With transfers above: min assist to stand from higher mat with emphasis on pushing up to stand vs pulling up to standing. Increased assist needed to stand from wheelchair with gait, still emphasizing pushing up to stand vs pulling.  Seated edge of mat before session: Sit<>stand to/from mat<>platform walker (see above) Standing with platform walker: min to mod assist for balance during dynamic activity listed below. Left leg stance: right stepping fwd/bwd Right stance: left marching, left stepping fwd/bwd        PT Short Term Goals - 04/09/15 1617    PT SHORT TERM GOAL #1   Title The patient will tolerate standing program with family assistance x 5 minutes for improved weight bearing and assist for ADLs.   Baseline Target date 05/09/2015   Time 4   Period Weeks   Status New   PT SHORT TERM GOAL #2   Title The patient will move sit<>supine with CGA.   Baseline Target date 05/09/2015   Time 4   Period Weeks  Status New   PT SHORT TERM GOAL #3   Title The patient will transfer w/c<>mat with sliding board with CGA of 1 person.   Baseline Target date 05/09/2015   Time 4   Period Weeks   Status New   PT SHORT TERM GOAL #4   Title The patient will peopel manual wheelchair with minimal verbal cues.   Baseline Target date 05/09/2015   Time 4   Period Weeks   Status New   PT SHORT TERM GOAL #5   Title The patient will ambulate in parallel bars with +2 assist x 5 feet to demo improved postural control and strength for standing tasks.   Baseline Target date 05/09/2015   Time 4   Period Weeks   Status New           PT Long  Term Goals - 04/09/15 1620    PT LONG TERM GOAL #1   Title The patient/family will verbalize understanding of CVA warning signs/risk factors.   Baseline Target date 06/08/2015   Time 8   Period Weeks   Status On-going   PT LONG TERM GOAL #2   Title The patient will move sit<>stand with CGA of 1 person with UE support.   Baseline Target date 06/08/2015   Time 8   Period Weeks   Status New   PT LONG TERM GOAL #3   Title The patient will transfer w/c<>mat with squat pivot or boost transfer with CGA for safety.   Baseline Target date 06/08/2015   Time 8   Period Weeks   Status New   PT LONG TERM GOAL #4   Title The patient will ambulate x 10 feet with mod A of 1 person with least restrictive assistive device.   Baseline Target date 06/08/2015   Time 8   Period Weeks   Status New   PT LONG TERM GOAL #5   Title The patient and family will demo indep with HEP for post d/c plan.   Baseline Target date 06/08/2015   Time 8   Period Weeks   Status New           Plan - 05/05/15 1534    Clinical Impression Statement Pt with increased gait distance (overall) today with use of platform RW.  Intermittent assist needed to fully advance left leg and multimodal cues on sequence and step length. Pt making steady progress toward goals.   Pt will benefit from skilled therapeutic intervention in order to improve on the following deficits Abnormal gait;Decreased balance;Decreased mobility;Decreased strength;Postural dysfunction;Impaired flexibility;Pain;Decreased activity tolerance;Decreased endurance;Increased muscle spasms;Difficulty walking   Rehab Potential Good   Clinical Impairments Affecting Rehab Potential patient private pay for services   PT Frequency 2x / week   PT Duration 8 weeks   PT Treatment/Interventions Electrical Stimulation;Gait training;Stair training;Neuromuscular re-education;Balance training;Therapeutic exercise;Therapeutic activities;Functional mobility  training;Patient/family education;Wheelchair mobility training;Manual techniques;Orthotic Fit/Training   PT Next Visit Plan Progressing towards updated STGs and LTGs with emphasis on standing tolerance, transfers, and functional mobility.   Consulted and Agree with Plan of Care Patient        Problem List Patient Active Problem List   Diagnosis Date Noted  . Hemorrhage, intracerebral (HCC) 02/03/2015  . Hemiparesis (HCC) 02/03/2015  . Spastic hemiplegia affecting nondominant side (HCC) 02/03/2015    Sallyanne KusterBury, Aireona Torelli 05/06/2015, 1:38 PM  Sallyanne KusterKathy Deanna Boehlke, PTA, [facility] [address], [address] [place], [place] [address] [phone] 05/06/2015, 1:38 PM   Name: Michelle Kemp MRN: [id number]  Date of Birth: [birth year]

## 2015-05-07 ENCOUNTER — Encounter: Payer: Self-pay | Admitting: Occupational Therapy

## 2015-05-07 ENCOUNTER — Other Ambulatory Visit (HOSPITAL_COMMUNITY): Payer: Self-pay

## 2015-05-07 ENCOUNTER — Encounter: Payer: Self-pay | Admitting: Physical Therapy

## 2015-05-07 ENCOUNTER — Ambulatory Visit: Payer: Self-pay | Admitting: Physical Therapy

## 2015-05-07 DIAGNOSIS — R269 Unspecified abnormalities of gait and mobility: Secondary | ICD-10-CM

## 2015-05-07 DIAGNOSIS — R293 Abnormal posture: Secondary | ICD-10-CM

## 2015-05-07 DIAGNOSIS — R2681 Unsteadiness on feet: Secondary | ICD-10-CM

## 2015-05-07 DIAGNOSIS — M6281 Muscle weakness (generalized): Secondary | ICD-10-CM

## 2015-05-07 NOTE — Therapy (Signed)
[facility] [address] [place], [place], [address] Phone: [phone]   Fax:  [phone]  Physical Therapy Treatment  Patient Details  Name: Michelle Kemp MRN: [id number] Date of Birth: 03/25/1941 No Data Recorded  Encounter Date: 05/07/2015   05/07/15 1448  PT Visits / Re-Eval  Visit Number 18  Number of Visits 32  Date for PT Re-Evaluation 06/08/15  Authorization  Authorization Type Self pay, no eligibility to apply for benefits per daughter's reports  PT Time Calculation  PT Start Time 1447  PT Stop Time 1530  PT Time Calculation (min) 43 min  PT - End of Session  Equipment Utilized During Treatment Gait belt  Activity Tolerance Patient tolerated treatment well  Behavior [facility] Therapy [facility] for tasks assessed/performed    Past Medical History  Diagnosis Date  . Stroke (HCC)   . Arthritis     Past Surgical History  Procedure Laterality Date  . Hernia repair  2001    There were no vitals filed for this visit.  Visit Diagnosis:   Unsteadiness on feet     Postural instability     Abnormality of gait    Muscle weakness        Subjective Assessment - 05/07/15 1448    Subjective No new complaints. No falls to report.    Currently in Pain? No/denies   Pain Score 0-No pain           [facility] Adult PT Treatment/Exercise - 05/07/15 1449    Ambulation/Gait   Ambulation/Gait Yes   Ambulation/Gait Assistance 4: Min assist;3: Mod assist  min to mod assist of 2 people with 3rd for chair follow   Ambulation/Gait Assistance Details multimodal cues needed for posture, sequenc with gait and to correct gait deviations.   Ambulation Distance (Feet) 36 Feet  x1, 35 x1, 40 x1   Assistive device Left platform walker   Gait Pattern Decreased stance time - left;Step-to pattern;Decreased dorsiflexion - left;Decreased weight shift to left;Trunk rotated posteriorly on left;Left flexed knee in stance;Decreased  hip/knee flexion - left   Ambulation Surface Level;Indoor     ther-ex seated in wheelchair: to work on using legs for wheelchair mobility and for strengthening Forward chair scoots with bil legs working together. Total assist to weight feet down to ground while pt bent legs to pull chair forward  X 10 feet. Max cues provided by interpreter for ex technique and form. Backward chair scoots with left leg only. Therapist kept right leg in extension, while facilitation left knee flexion for pt to extend to push chair x 10 feet. Long arc quads x 10 reps with limited range noted, facilitation to quad provided to increase muscle contraction. Marching x 10 with limited range noted that decreased as reps increased.            PT Short Term Goals - 04/09/15 1617    PT SHORT TERM GOAL #1   Title The patient will tolerate standing program with family assistance x 5 minutes for improved weight bearing and assist for ADLs.   Baseline Target date 05/09/2015   Time 4   Period Weeks   Status New   PT SHORT TERM GOAL #2   Title The patient will move sit<>supine with CGA.   Baseline Target date 05/09/2015   Time 4   Period Weeks   Status New   PT SHORT TERM GOAL #3   Title The patient will transfer w/c<>mat with sliding board with CGA of 1  person.   Baseline Target date 05/09/2015   Time 4   Period Weeks   Status New   PT SHORT TERM GOAL #4   Title The patient will peopel manual wheelchair with minimal verbal cues.   Baseline Target date 05/09/2015   Time 4   Period Weeks   Status New   PT SHORT TERM GOAL #5   Title The patient will ambulate in parallel bars with +2 assist x 5 feet to demo improved postural control and strength for standing tasks.   Baseline Target date 05/09/2015   Time 4   Period Weeks   Status New           PT Long Term Goals - 04/09/15 1620    PT LONG TERM GOAL #1   Title The patient/family will verbalize understanding of CVA warning signs/risk factors.    Baseline Target date 06/08/2015   Time 8   Period Weeks   Status On-going   PT LONG TERM GOAL #2   Title The patient will move sit<>stand with CGA of 1 person with UE support.   Baseline Target date 06/08/2015   Time 8   Period Weeks   Status New   PT LONG TERM GOAL #3   Title The patient will transfer w/c<>mat with squat pivot or boost transfer with CGA for safety.   Baseline Target date 06/08/2015   Time 8   Period Weeks   Status New   PT LONG TERM GOAL #4   Title The patient will ambulate x 10 feet with mod A of 1 person with least restrictive assistive device.   Baseline Target date 06/08/2015   Time 8   Period Weeks   Status New   PT LONG TERM GOAL #5   Title The patient and family will demo indep with HEP for post d/c plan.   Baseline Target date 06/08/2015   Time 8   Period Weeks   Status New        05/07/15 1448  Plan  Clinical Impression Statement Pt continues to make steady gains with gait using platform walker. Worked more on strengthening and wheelchair management today as well. Progressing toward goals.  Pt will benefit from skilled therapeutic intervention in order to improve on the following deficits Abnormal gait;Decreased balance;Decreased mobility;Decreased strength;Postural dysfunction;Impaired flexibility;Pain;Decreased activity tolerance;Decreased endurance;Increased muscle spasms;Difficulty walking  Rehab Potential Good  Clinical Impairments Affecting Rehab Potential patient private pay for services  PT Frequency 2x / week  PT Duration 8 weeks  PT Treatment/Interventions Electrical Stimulation;Gait training;Stair training;Neuromuscular re-education;Balance training;Therapeutic exercise;Therapeutic activities;Functional mobility training;Patient/family education;Wheelchair mobility training;Manual techniques;Orthotic Fit/Training  PT Next Visit Plan Progressing towards updated STGs and LTGs with emphasis on standing tolerance, transfers, and functional  mobility.  Consulted and Agree with Plan of Care Patient       Problem List Patient Active Problem List   Diagnosis Date Noted  . Hemorrhage, intracerebral (HCC) 02/03/2015  . Hemiparesis (HCC) 02/03/2015  . Spastic hemiplegia affecting nondominant side (HCC) 02/03/2015    Sallyanne Kuster 05/07/2015, 3:29 PM  Sallyanne Kuster, PTA, [facility] [address], [address] [place], [place] [address] [phone] 05/08/2015, 11:28 PM   Name: Priyal Musquiz MRN: [id number] Date of Birth: August 21, 1940

## 2015-05-12 ENCOUNTER — Ambulatory Visit: Payer: Self-pay | Attending: Neurology | Admitting: Physical Therapy

## 2015-05-12 DIAGNOSIS — M6281 Muscle weakness (generalized): Secondary | ICD-10-CM | POA: Insufficient documentation

## 2015-05-12 DIAGNOSIS — M25612 Stiffness of left shoulder, not elsewhere classified: Secondary | ICD-10-CM | POA: Insufficient documentation

## 2015-05-12 DIAGNOSIS — G8110 Spastic hemiplegia affecting unspecified side: Secondary | ICD-10-CM | POA: Insufficient documentation

## 2015-05-12 DIAGNOSIS — R293 Abnormal posture: Secondary | ICD-10-CM | POA: Insufficient documentation

## 2015-05-12 DIAGNOSIS — R269 Unspecified abnormalities of gait and mobility: Secondary | ICD-10-CM | POA: Insufficient documentation

## 2015-05-12 DIAGNOSIS — M25512 Pain in left shoulder: Secondary | ICD-10-CM | POA: Insufficient documentation

## 2015-05-12 DIAGNOSIS — R2681 Unsteadiness on feet: Secondary | ICD-10-CM | POA: Insufficient documentation

## 2015-05-12 DIAGNOSIS — M25542 Pain in joints of left hand: Secondary | ICD-10-CM | POA: Insufficient documentation

## 2015-05-12 NOTE — Therapy (Signed)
Michelle Kemp [address] [place], [place], [address] Phone: [phone]   Fax:  [phone]  Physical Therapy Treatment  Patient Details  Name: Michelle Kemp MRN: [id number] Date of Birth: 04/11/1941 No Data Recorded  Encounter Date: 05/12/2015      PT End of Session - 05/12/15 1920    Visit Number 19   Number of Visits 32   Date for PT Re-Evaluation 06/08/15   Authorization Type Self pay, no eligibility to apply for benefits per daughter's reports   PT Start Time 1448   PT Stop Time 1535   PT Time Calculation (min) 47 min   Activity Tolerance Patient tolerated treatment well   Behavior During Therapy [facility] for tasks assessed/performed      Past Medical History  Diagnosis Date  . Stroke ([place])   . Arthritis     Past Surgical History  Procedure Laterality Date  . Hernia repair  2001    There were no vitals filed for this visit.  Visit Diagnosis:  Abnormality of gait  Spastic hemiplegia affecting nondominant side (HCC)  Muscle weakness      Subjective Assessment - 05/12/15 1857    Subjective Pt reports no falls. During bed mobility, pt reports she gets into/out of bed by hersefl at home.   Patient is accompained by: Interpreter   Pertinent History family friend   Patient Stated Goals Improve mobility and endurance   Currently in Pain? No/denies                         [facility] Adult PT Treatment/Exercise - 05/12/15 0001    Bed Mobility   Bed Mobility Supine to Sit;Sit to Supine;Sitting - Scoot to Edge of Bed   Supine to Sit 4: Min assist;3: Mod assist   Supine to Sit Details (indicate cue type and reason) To R side, required mod A, tactile cueing to initiate movement of LUE across midline; tactile cueing for attention to/advancement of LLE toward EOB. To L side, pt required min A and tactile/demonstration cueing for use of RLE to manage LLE.   Sitting - Scoot to [employer] of Bed 3: Mod  assist;4: Min assist   Sitting - Scoot to Edge of Bed Details (indicate cue type and reason) difficulty with AP/PA and lateral scooting; required demonstration, tactile cueing at L knee for increased LLE WB/activation. Attempted to instruct in reciprocal scooting; however, pt exhibited difficulty due to decreased L trunk shortening.   Sit to Supine 3: Mod assist   Sit to Supine - Details (indicate cue type and reason) multimodal cueing required for technique, LLE management.   Transfers   Transfers Sit to Stand;Stand to Sit   Sit to Stand 3: Mod assist   Stand to Sit 3: Mod assist   Stand Pivot Transfers 2: Max assist   Ambulation/Gait   Ambulation/Gait Yes   Ambulation/Gait Assistance 3: Mod assist;2: Max assist   Ambulation Distance (Feet) 20 Feet  then 23'   Assistive device Left platform walker   Gait Pattern Decreased stance time - left;Step-to pattern;Decreased dorsiflexion - left;Decreased weight shift to left;Trunk rotated posteriorly on left;Left flexed knee in stance;Decreased hip/knee flexion - left;Decreased step length - right;Trunk flexed;Poor foot clearance - left   Ambulation Surface Level;Indoor   Gait Comments During initial gait trial, ACE bandage at LLE for ankle DF assist; simulated toe cap on L foot for consistent LLE clearance. Gait x20', x23' with L PFRW and manual  facilitation of B hip extension (crouched posture throughout) and L pelvic protraction; verbal/tactile cueing to address posterior rotation of trunk on L and for upright posture. When pelvic rotation and posture in correct alignment, pt able to consistently advance LLE without L toe catch. Also noted increased independence when cadence of gait increased. With tactile cueing for lateral weight shift to R, pt with increased discomfort/difficulty controlling weight shift.   Posture/Postural Control   Posture/Postural Control --                PT Education - 05/12/15 1859    Education provided Yes    Education Details Recommending prone position (goal of 10 minutes per day) to increase extensibility of hip flexors, improve standing/walking.   Person(s) Educated Theatre stage manager);Patient   Methods Explanation   Comprehension Verbalized understanding          PT Short Term Goals - 05/12/15 1507    PT SHORT TERM GOAL #1   Title The patient will tolerate standing program with family assistance x 5 minutes for improved weight bearing and assist for ADLs. Target date 05/09/2015   Baseline 11/2: Achieved, per pt report.   Time 4   Period Weeks   Status Achieved   PT SHORT TERM GOAL #2   Title The patient will move sit<>supine with CGA. Target date 05/09/2015   Baseline 11/2: pt required min to mod A and 50% cueing.   Time 4   Period Weeks   Status Not Met   PT SHORT TERM GOAL #3   Title The patient will transfer w/c<>mat with sliding board with CGA of 1 person. Target date 05/09/2015   Baseline Deferred, as pt currently performing standing pivot transfers with family.   Time 4   Period Weeks   Status Deferred   PT SHORT TERM GOAL #4   Title The patient will propel manual wheelchair with minimal verbal cues.   Baseline Target date 05/09/2015   Time 4   Period Weeks   Status On-going   PT SHORT TERM GOAL #5   Title The patient will ambulate in parallel bars with +2 assist x 5 feet to demo improved postural control and strength for standing tasks. Target date 05/09/2015   Baseline 11/2: Pt has consistently ambulated with min to max A using L PFRW.   Time 4   Period Weeks   Status Achieved           PT Long Term Goals - 04/09/15 1620    PT LONG TERM GOAL #1   Title The patient/family will verbalize understanding of CVA warning signs/risk factors.   Baseline Target date 06/08/2015   Time 8   Period Weeks   Status On-going   PT LONG TERM GOAL #2   Title The patient will move sit<>stand with CGA of 1 person with UE support.   Baseline Target date 06/08/2015   Time 8   Period  Weeks   Status New   PT LONG TERM GOAL #3   Title The patient will transfer w/c<>mat with squat pivot or boost transfer with CGA for safety.   Baseline Target date 06/08/2015   Time 8   Period Weeks   Status New   PT LONG TERM GOAL #4   Title The patient will ambulate x 10 feet with mod A of 1 person with least restrictive assistive device.   Baseline Target date 06/08/2015   Time 8   Period Weeks   Status New   PT LONG  TERM GOAL #5   Title The patient and family will demo indep with HEP for post d/c plan.   Baseline Target date 06/08/2015   Time 8   Period Weeks   Status New               Plan - 05/12/15 1921    Clinical Impression Statement Session focused on gait training and beginning to assess STG's. Pt met STG's for standing program and functional ambulation. Bed mobility goal not met. Recommending prone lying at home to increase hip flexor extensbility to improve postural alignment, gait pattern.   Pt will benefit from skilled therapeutic intervention in order to improve on the following deficits Abnormal gait;Decreased balance;Decreased mobility;Decreased strength;Postural dysfunction;Impaired flexibility;Pain;Decreased activity tolerance;Decreased endurance;Increased muscle spasms;Difficulty walking   Rehab Potential Good   Clinical Impairments Affecting Rehab Potential patient private pay for services   PT Frequency 2x / week   PT Duration 8 weeks   PT Treatment/Interventions Electrical Stimulation;Gait training;Stair training;Neuromuscular re-education;Balance training;Therapeutic exercise;Therapeutic activities;Functional mobility training;Patient/family education;Wheelchair mobility training;Manual techniques;Orthotic Fit/Training   PT Next Visit Plan Please finish checking STG's. Bed mobility (both for function and L NMR). Seated scooting with symmetrical LE WB. Continue gait training.   Recommended Other Services 11/2: Orthotist to be present for AFO consult  (likely custom) next time [place] scheduled with pt   Consulted and Agree with Plan of Care Patient;Family member/caregiver   Family Member Consulted family friend        Problem List Patient Active Problem List   Diagnosis Date Noted  . Hemorrhage, intracerebral ([place]) 02/03/2015  . Hemiparesis ([place]) 02/03/2015  . Spastic hemiplegia affecting nondominant side ([place]) 02/03/2015    Billie Ruddy, PT, DPT [facility] [address] [place], [place], [address] Phone: [phone]   Fax:  [phone] 05/12/2015, 7:47 PM   Name: Michelle Kemp MRN: [id number] Date of Birth: 02-Jun-1941

## 2015-05-13 NOTE — Telephone Encounter (Signed)
Error

## 2015-05-14 ENCOUNTER — Ambulatory Visit: Payer: Self-pay | Admitting: Physical Therapy

## 2015-05-14 ENCOUNTER — Ambulatory Visit (HOSPITAL_COMMUNITY): Payer: Self-pay | Attending: Neurology

## 2015-05-14 ENCOUNTER — Encounter: Payer: Self-pay | Admitting: Physical Therapy

## 2015-05-14 ENCOUNTER — Other Ambulatory Visit: Payer: Self-pay

## 2015-05-14 DIAGNOSIS — Z6831 Body mass index (BMI) 31.0-31.9, adult: Secondary | ICD-10-CM | POA: Insufficient documentation

## 2015-05-14 DIAGNOSIS — I340 Nonrheumatic mitral (valve) insufficiency: Secondary | ICD-10-CM | POA: Insufficient documentation

## 2015-05-14 DIAGNOSIS — G8110 Spastic hemiplegia affecting unspecified side: Secondary | ICD-10-CM

## 2015-05-14 DIAGNOSIS — I351 Nonrheumatic aortic (valve) insufficiency: Secondary | ICD-10-CM | POA: Insufficient documentation

## 2015-05-14 DIAGNOSIS — I371 Nonrheumatic pulmonary valve insufficiency: Secondary | ICD-10-CM | POA: Insufficient documentation

## 2015-05-14 DIAGNOSIS — M6281 Muscle weakness (generalized): Secondary | ICD-10-CM

## 2015-05-14 DIAGNOSIS — R293 Abnormal posture: Secondary | ICD-10-CM

## 2015-05-14 DIAGNOSIS — I071 Rheumatic tricuspid insufficiency: Secondary | ICD-10-CM | POA: Insufficient documentation

## 2015-05-14 DIAGNOSIS — R2681 Unsteadiness on feet: Secondary | ICD-10-CM

## 2015-05-14 DIAGNOSIS — E669 Obesity, unspecified: Secondary | ICD-10-CM | POA: Insufficient documentation

## 2015-05-14 DIAGNOSIS — R269 Unspecified abnormalities of gait and mobility: Secondary | ICD-10-CM

## 2015-05-14 DIAGNOSIS — I639 Cerebral infarction, unspecified: Secondary | ICD-10-CM | POA: Insufficient documentation

## 2015-05-14 DIAGNOSIS — I610 Nontraumatic intracerebral hemorrhage in hemisphere, subcortical: Secondary | ICD-10-CM

## 2015-05-14 DIAGNOSIS — G8190 Hemiplegia, unspecified affecting unspecified side: Secondary | ICD-10-CM

## 2015-05-15 NOTE — Therapy (Signed)
[facility] [address] [place], [place], [address] Phone: [phone]   Fax:  [phone]  Physical Therapy Treatment  Patient Details  Name: Michelle Kemp MRN: [id number] Date of Birth: April 26, 1941 No Data Recorded  Encounter Date: 05/14/2015   05/14/15 1452  PT Visits / Re-Eval  Visit Number 20  Number of Visits 32  Date for PT Re-Evaluation 06/08/15  Authorization  Authorization Type Self pay, no eligibility to apply for benefits per daughter's reports  PT Time Calculation  PT Start Time 1448  PT Stop Time 1530  PT Time Calculation (min) 42 min  PT - End of Session  Activity Tolerance Patient tolerated treatment well  Behavior During Therapy [facility] for tasks assessed/performed     Past Medical History  Diagnosis Date  . Stroke ([place])   . Arthritis     Past Surgical History  Procedure Laterality Date  . Hernia repair  2001    There were no vitals filed for this visit.  Visit Diagnosis:  Abnormality of gait  Spastic hemiplegia affecting nondominant side (HCC)  Muscle weakness  Unsteadiness on feet  Postural instability        [facility] Adult PT Treatment/Exercise - 05/16/15 0001    Bed Mobility   Rolling Right 4: Min guard;4: Min assist  x 5 reps   Rolling Right Details (indicate cue type and reason) multimodal cues provided on technique and sequency with decreased assistance needed as reps progressed   Rolling Left 4: Min guard;4: Min assist  x 5 reps   Rolling Left Details (indicate cue type and reason) multimodal cues for technique and sequencing with decreased assistance needed as reps progressed   Left Sidelying to Sit 4: Min assist  x 1 rep   Left Sidelying to Sit Details (indicate cue type and reason) multimodal cues on sequencing and technique.   Transfers   Transfers Sit to Stand;Stand to [employer] Transfers   Sit to Stand 3: Mod assist;4: Min assist   Sit to Stand Details  Verbal cues for technique;Verbal cues for sequencing;Manual facilitation for weight shifting;Manual facilitation for weight bearing;Visual cues/gestures for sequencing;Tactile cues for posture;Tactile cues for placement   Sit to Stand Details (indicate cue type and reason) multimodal cues for technique and sequencing. less assistance needed with repetitive standing trials.                                                                Stand to Sit 3: Mod assist   Stand to Sit Details (indicate cue type and reason) Visual cues/gestures for precautions/safety;Visual cues/gestures for sequencing;Manual facilitation for weight shifting;Manual facilitation for placement;Manual facilitation for weight bearing;Verbal cues for precautions/safety;Tactile cues for weight beaing;Tactile cues for placement   Stand to Sit Details cues on sequence and technique. total assist to remove hand from platform prior to sitting   Stand Pivot Transfers 4: Min assist   Stand Pivot Transfer Details (indicate cue type and reason) w/c to mat table with cues on posture and sequence/technique. pt taking 2-3 steps with transfer.   Ambulation/Gait   Ambulation/Gait Yes   Ambulation/Gait Assistance 3: Mod assist  + 2cd person for safety and 3rd person for w/c follow   Ambulation/Gait Assistance Details max multimodal cues for posture, to decrease  trunk rotaton, for proper gait sequence and to correct gait deviations. assist needed for walker management and for left leg advancement and step placement with gait.                            Ambulation Distance (Feet) 10 Feet   Assistive device Left platform walker   Gait Pattern Decreased stance time - left;Step-to pattern;Decreased dorsiflexion - left;Decreased weight shift to left;Trunk rotated posteriorly on left;Left flexed knee in stance;Decreased hip/knee flexion - left;Decreased step length - right;Trunk flexed;Poor foot clearance - left   Ambulation Surface Level;Indoor       pt seated in wheelchair: worked on pt self propelling wheelchair using right arm and right leg. Pt able to go backwards with cues and supervision. However despite max cueing and facilitation pt unable to propel wheelchair forward without veering toward left. focused on use of right leg to pull chair and assist with steering via tactile cues and facilitation, however pt unable to assist or return demo.         PT Short Term Goals - 05/14/15 1453    PT SHORT TERM GOAL #1   Title (p) The patient will tolerate standing program with family assistance x 5 minutes for improved weight bearing and assist for ADLs. Target date 05/09/2015   Baseline (p) 11/2: Achieved, per pt report.   Status (p) Achieved   PT SHORT TERM GOAL #2   Title (p) The patient will move sit<>supine with CGA. Target date 05/09/2015   Baseline (p) 11/2: pt required min to mod A and 50% cueing.   Status (p) Not Met   PT SHORT TERM GOAL #3   Title (p) The patient will transfer w/c<>mat with sliding board with CGA of 1 person. Target date 05/09/2015   Baseline (p) Deferred, as pt currently performing standing pivot transfers with family.   Status (p) Deferred   PT SHORT TERM GOAL #4   Title (p) The patient will propel manual wheelchair with minimal verbal cues.   Baseline (p) Target date 05/09/2015   Status (p) Not Met   PT SHORT TERM GOAL #5   Title (p) The patient will ambulate in parallel bars with +2 assist x 5 feet to demo improved postural control and strength for standing tasks. Target date 05/09/2015   Baseline (p) 11/2: Pt has consistently ambulated with min to max A using L PFRW.   Status (p) Achieved           PT Long Term Goals - 04/09/15 1620    PT LONG TERM GOAL #1   Title The patient/family will verbalize understanding of CVA warning signs/risk factors.   Baseline Target date 06/08/2015   Time 8   Period Weeks   Status On-going   PT LONG TERM GOAL #2   Title The patient will move sit<>stand with CGA  of 1 person with UE support.   Baseline Target date 06/08/2015   Time 8   Period Weeks   Status New   PT LONG TERM GOAL #3   Title The patient will transfer w/c<>mat with squat pivot or boost transfer with CGA for safety.   Baseline Target date 06/08/2015   Time 8   Period Weeks   Status New   PT LONG TERM GOAL #4   Title The patient will ambulate x 10 feet with mod A of 1 person with least restrictive assistive device.   Baseline Target date 06/08/2015  Time 8   Period Weeks   Status New   PT LONG TERM GOAL #5   Title The patient and family will demo indep with HEP for post d/c plan.   Baseline Target date 06/08/2015   Time 8   Period Weeks   Status New        05/14/15 1452  Plan  Clinical Impression Statement Pt with increased diffculty advancing hemiparetic leg today with gait. Focused on self propelling wheelchair and bed mobility. Pt making progress with bed mobility, however not with wheelchair self propulsion. Pt still needing max multimodal cues to perform minimal self wheelchair propulsion. Pt making progress with scooting. Continue toward goals per PT plan of care.                                                            Pt will benefit from skilled therapeutic intervention in order to improve on the following deficits Abnormal gait;Decreased balance;Decreased mobility;Decreased strength;Postural dysfunction;Impaired flexibility;Pain;Decreased activity tolerance;Decreased endurance;Increased muscle spasms;Difficulty walking  Rehab Potential Good  Clinical Impairments Affecting Rehab Potential patient private pay for services  PT Frequency 2x / week  PT Duration 8 weeks  PT Treatment/Interventions Electrical Stimulation;Gait training;Stair training;Neuromuscular re-education;Balance training;Therapeutic exercise;Therapeutic activities;Functional mobility training;Patient/family education;Wheelchair mobility training;Manual techniques;Orthotic Fit/Training  PT Next Visit  Plan . Bed mobility (both for function and L NMR). Seated scooting with symmetrical LE WB. Continue gait training.  Consulted and Agree with Plan of Care Patient;Family member/caregiver  Family Member Consulted family friend      Problem List Patient Active Problem List   Diagnosis Date Noted  . 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 Hemiparesis ([place]) 02/03/2015  . Spastic hemiplegia affecting nondominant side ([place]) 02/03/2015    Willow Ora 05/16/2015, 12:16 AM  Willow Ora, PTA, [facility] [address], [place], [place] [address] [phone] 05/16/2015, 12:16 AM   Name: Lakecia Deschamps MRN: [id number] Date of Birth: 18-Apr-1941

## 2015-05-16 ENCOUNTER — Encounter (HOSPITAL_COMMUNITY): Payer: Self-pay | Admitting: *Deleted

## 2015-05-16 ENCOUNTER — Emergency Department (HOSPITAL_COMMUNITY): Payer: Self-pay

## 2015-05-16 ENCOUNTER — Other Ambulatory Visit: Payer: Self-pay

## 2015-05-16 ENCOUNTER — Emergency Department (HOSPITAL_COMMUNITY)
Admission: EM | Admit: 2015-05-16 | Discharge: 2015-05-16 | Disposition: A | Discharge status: Home / Self Care | Payer: Self-pay | Attending: Emergency Medicine | Admitting: Emergency Medicine

## 2015-05-16 DIAGNOSIS — Z7982 Long term (current) use of aspirin: Secondary | ICD-10-CM | POA: Insufficient documentation

## 2015-05-16 DIAGNOSIS — Z79899 Other long term (current) drug therapy: Secondary | ICD-10-CM | POA: Insufficient documentation

## 2015-05-16 DIAGNOSIS — M1990 Unspecified osteoarthritis, unspecified site: Secondary | ICD-10-CM | POA: Insufficient documentation

## 2015-05-16 DIAGNOSIS — M6289 Other specified disorders of muscle: Secondary | ICD-10-CM

## 2015-05-16 DIAGNOSIS — I639 Cerebral infarction, unspecified: Secondary | ICD-10-CM | POA: Insufficient documentation

## 2015-05-16 LAB — I-STAT CHEM 8, ED
BUN: 17 mg/dL (ref 6–20)
CALCIUM ION: 1.17 mmol/L (ref 1.13–1.30)
CHLORIDE: 103 mmol/L (ref 101–111)
Creatinine, Ser: 1 mg/dL (ref 0.44–1.00)
GLUCOSE: 90 mg/dL (ref 65–99)
HCT: 38 % (ref 36.0–46.0)
HEMOGLOBIN: 12.9 g/dL (ref 12.0–15.0)
POTASSIUM: 3.7 mmol/L (ref 3.5–5.1)
SODIUM: 141 mmol/L (ref 135–145)
TCO2: 24 mmol/L (ref 0–100)

## 2015-05-16 LAB — COMPREHENSIVE METABOLIC PANEL
ALT: 14 U/L (ref 14–54)
ANION GAP: 12 (ref 5–15)
AST: 21 U/L (ref 15–41)
Albumin: 3.6 g/dL (ref 3.5–5.0)
Alkaline Phosphatase: 99 U/L (ref 38–126)
BUN: 15 mg/dL (ref 6–20)
CHLORIDE: 102 mmol/L (ref 101–111)
CO2: 25 mmol/L (ref 22–32)
CREATININE: 0.99 mg/dL (ref 0.44–1.00)
Calcium: 9.7 mg/dL (ref 8.9–10.3)
GFR, EST NON AFRICAN AMERICAN: 55 mL/min — AB (ref >60)
Glucose, Bld: 94 mg/dL (ref 65–99)
POTASSIUM: 3.8 mmol/L (ref 3.5–5.1)
SODIUM: 139 mmol/L (ref 135–145)
Total Bilirubin: 0.8 mg/dL (ref 0.3–1.2)
Total Protein: 7.6 g/dL (ref 6.5–8.1)

## 2015-05-16 LAB — DIFFERENTIAL
BASOS PCT: 1 %
Basophils Absolute: 0 10*3/uL (ref 0.0–0.1)
EOS ABS: 0.3 10*3/uL (ref 0.0–0.7)
EOS PCT: 4 %
Lymphocytes Relative: 41 %
Lymphs Abs: 2.8 10*3/uL (ref 0.7–4.0)
MONO ABS: 0.5 10*3/uL (ref 0.1–1.0)
MONOS PCT: 7 %
Neutro Abs: 3.2 10*3/uL (ref 1.7–7.7)
Neutrophils Relative %: 47 %

## 2015-05-16 LAB — CBC
HEMATOCRIT: 35.1 % — AB (ref 36.0–46.0)
Hemoglobin: 13.2 g/dL (ref 12.0–15.0)
MCH: 31.4 pg (ref 26.0–34.0)
MCHC: 37.6 g/dL — AB (ref 30.0–36.0)
MCV: 83.4 fL (ref 78.0–100.0)
PLATELETS: 265 10*3/uL (ref 150–400)
RBC: 4.21 MIL/uL (ref 3.87–5.11)
RDW: 14.4 % (ref 11.5–15.5)
WBC: 6.7 10*3/uL (ref 4.0–10.5)

## 2015-05-16 LAB — PROTIME-INR
INR: 1.03 (ref 0.00–1.49)
PROTHROMBIN TIME: 13.7 s (ref 11.6–15.2)

## 2015-05-16 LAB — APTT: aPTT: 27 seconds (ref 24–37)

## 2015-05-16 LAB — I-STAT TROPONIN, ED: TROPONIN I, POC: 0.01 ng/mL (ref 0.00–0.08)

## 2015-05-16 LAB — ETHANOL

## 2015-05-16 MED ORDER — FENTANYL CITRATE (PF) 100 MCG/2ML IJ SOLN
50.0000 ug | Freq: Once | INTRAMUSCULAR | Status: AC
  Administered 2015-05-16: 50 ug via INTRAVENOUS
  Filled 2015-05-16: qty 2

## 2015-05-16 MED ORDER — MORPHINE SULFATE (PF) 4 MG/ML IV SOLN
4.0000 mg | Freq: Once | INTRAVENOUS | Status: AC
  Administered 2015-05-16: 4 mg via INTRAVENOUS
  Filled 2015-05-16: qty 1

## 2015-05-16 MED ORDER — TRAMADOL HCL 50 MG PO TABS: 50.0000 mg | ORAL_TABLET | Freq: Two times a day (BID) | ORAL | Status: DC | PRN

## 2015-05-16 MED ORDER — FENTANYL CITRATE (PF) 100 MCG/2ML IJ SOLN: 25.0000 ug | Freq: Once | INTRAMUSCULAR | Status: DC

## 2015-05-16 NOTE — ED Provider Notes (Signed)
CSN: [id number]     Arrival date & time 05/16/15  0422 History   First MD Initiated Contact with Patient 05/16/15 [phone]     Chief Complaint  Patient presents with  . Code Stroke    An emergency department physician performed an initial assessment on this suspected stroke patient at 65. (Consider location/radiation/quality/duration/timing/severity/associated sxs/prior Treatment) HPI  Michelle Kemp is a 74yo female, PMH hemorraghic stroke, here with possible stroke symptoms.  Patient unable to give history because she can not speak english, per EMS patietn had facial droop and worsening L sided weakness.  Patient went straight to CT scan.    Past Medical History  Diagnosis Date  . Stroke (HCC)   . Arthritis    Past Surgical History  Procedure Laterality Date  . Hernia repair  2001   Family History  Problem Relation Age of Onset  . Arthritis    . Stroke Neg Hx    Social History  Substance Use Topics  . Smoking status: Never Smoker   . Smokeless tobacco: None  . Alcohol Use: No   OB History    No data available     Review of Systems  Unable to perform ROS: Other      Allergies  Review of patient's allergies indicates no known allergies.  Home Medications   Prior to Admission medications   Medication Sig Start Date End Date Taking? Authorizing Provider  aspirin EC 81 MG tablet Take 1 tablet (81 mg total) by mouth daily. 04/12/15  Yes Marvel Plan, MD  baclofen (LIORESAL) 10 MG tablet Take 1 tablet (10 mg total) by mouth 3 (three) times daily. 02/03/15  Yes Anson Fret, MD  Cholecalciferol (VITAMIN D-3 PO) Take 5,000 Units by mouth daily.    Yes Historical Provider, MD  Cyanocobalamin (VITAMIN B-12 PO) Take 5 mLs by mouth daily.   Yes Historical Provider, MD  HYDROcodone-acetaminophen (NORCO/VICODIN) 5-325 MG tablet Take 1 tablet by mouth every 6 (six) hours as needed for moderate pain.   Yes Historical Provider, MD  Pyridoxine HCl (VITAMIN B-6 PO) Take 5 mLs by  mouth daily.   Yes Historical Provider, MD   BP 160/93 mmHg  Pulse 73  Temp(Src) 98 F (36.7 C) (Oral)  Resp 28  Ht  (1.6 m)  Wt 180 lb (81.647 kg)  BMI 31.89 kg/m2  SpO2 98% Physical Exam  Constitutional: She appears well-developed and well-nourished. No distress.  HENT:  Head: Normocephalic and atraumatic.  Nose: Nose normal.  Mouth/Throat: Oropharynx is clear and moist. No oropharyngeal exudate.  Eyes: Conjunctivae and EOM are normal. Pupils are equal, round, and reactive to light. No scleral icterus.  Neck: Normal range of motion. Neck supple. No JVD present. No tracheal deviation present. No thyromegaly present.  Cardiovascular: Normal rate, regular rhythm and normal heart sounds.  Exam reveals no gallop and no friction rub.   No murmur heard. Pulmonary/Chest: Effort normal and breath sounds normal. No respiratory distress. She has no wheezes. She exhibits no tenderness.  Abdominal: Soft. Bowel sounds are normal. She exhibits no distension and no mass. There is no tenderness. There is no rebound and no guarding.  Musculoskeletal: Normal range of motion. She exhibits no edema or tenderness.  Lymphadenopathy:    She has no cervical adenopathy.  Neurological: She is alert. A cranial nerve deficit is present. She exhibits abnormal muscle tone.  Mild facial droop seen, 0/5 strength in LUE and LLE.  Normal strength on right side  Skin: Skin is  warm and dry. No rash noted. No erythema. No pallor.  Nursing note and vitals reviewed.   ED Course  Procedures (including critical care time) Labs Review Labs Reviewed  CBC - Abnormal; Notable for the following:    HCT 35.1 (*)    MCHC 37.6 (*)    All other components within normal limits  COMPREHENSIVE METABOLIC PANEL - Abnormal; Notable for the following:    GFR calc non Af Amer 55 (*)    All other components within normal limits  ETHANOL  PROTIME-INR  APTT  DIFFERENTIAL  URINE RAPID DRUG SCREEN, HOSP PERFORMED   URINALYSIS, ROUTINE W REFLEX MICROSCOPIC (NOT AT [facility])  I-STAT CHEM 8, ED  I-STAT TROPOININ, ED    Imaging Review Ct Head Wo Contrast  05/16/2015  CLINICAL DATA:  Stroke-like symptoms EXAM: CT HEAD WITHOUT CONTRAST TECHNIQUE: Contiguous axial images were obtained from the base of the skull through the vertex without intravenous contrast. COMPARISON:  01/18/2015 FINDINGS: There is no intracranial hemorrhage, mass or evidence of acute infarction. There is mild to moderate generalized atrophy. There is periventricular hypodensity consistent with chronic small vessel disease. There are remote infarctions involving the left cerebellar hemisphere, the left globus pallidus, and the right frontal lobe. No interval change is evident from 01/18/2015. No bony abnormalities evident. The visible paranasal sinuses are clear. IMPRESSION: Atrophy and remote infarctions, as well as chronic small vessel disease. No acute findings. Critical Value/emergent results were called by telephone at the time of interpretation on 05/16/2015 at 4:44 am to Dr. Tomasita Crumble , who verbally acknowledged these results. Electronically Signed   By: Ellery Plunk M.D.   On: 05/16/2015 04:45   Mr Brain Wo Contrast  05/16/2015  CLINICAL DATA:  74 year old female with previous hemorrhagic stroke. Increased left side weakness since 0130 hours. Initial encounter. EXAM: MRI HEAD WITHOUT CONTRAST TECHNIQUE: Multiplanar, multiecho pulse sequences of the brain and surrounding structures were obtained without intravenous contrast. COMPARISON:  Head CT without contrast 0532 hours, 01/18/2015. FINDINGS: Wedge-shaped T1 and T2 hyperintense collections situated at the right superior frontal gyrus partially abutting the motor strip with surrounding T2* signal drop. This is partially restricted on diffusion. The lesion extends toward the right lateral ventricle with mild ex vacuo enlargement. This area was hypodense by CT. There is surrounding T2 and  FLAIR hyperintensity without mass effect. No other restricted diffusion. Major intracranial vascular flow voids are preserved with generalized intracranial artery dolichoectasia. Patchy and confluent white matter signal elsewhere in both hemispheres. T2 heterogeneity in the deep gray matter nuclei. Small chronic micro hemorrhages at the right frontal horn and medial left thalamus. Patchy T2 hyperintensity in the pons. Small chronic infarct in the low left cerebellar hemisphere. No midline shift, mass effect, ventriculomegaly, extra-axial collection or acute intracranial hemorrhage. Cervicomedullary junction and pituitary are within normal limits. Negative visualized cervical spine. Normal bone marrow signal. Postoperative changes to the right globe. Paranasal sinuses and mastoids are clear. Negative scalp soft tissues. IMPRESSION: 1. No acute infarct identified. 2. Posterior right frontal lobe encephalomalacia with internal complex fluid, favored to be hemorrhage with unresolved extracellular methemoglobin. Surrounding gliosis. No associated mass effect. 3. Intracranial artery dolichoectasia. Chronic lacunar infarcts elsewhere. Electronically Signed   By: Odessa Fleming M.D.   On: 05/16/2015 07:08   Dg Hip Unilat With Pelvis 2-3 Views Left  05/16/2015  CLINICAL DATA:  Left hip pain EXAM: DG HIP (WITH OR WITHOUT PELVIS) 2-3V LEFT COMPARISON:  None. FINDINGS: There is no evidence of hip fracture or  dislocation. There is no evidence of arthropathy or other focal bone abnormality. IMPRESSION: Negative. Electronically Signed   By: Ellery Plunkaniel R Mitchell M.D.   On: 05/16/2015 06:38   I have personally reviewed and evaluated these images and lab results as part of my medical decision-making.   EKG Interpretation None      MDM   Final diagnoses:  CVA (cerebral infarction)    Patient presents to the ED for evaluation of stroke.  Daughter in the room now states the patient was able to walk and now can not move L side.   Dr. Hosie PoissonSumner with neurology recs for MRI, if normal, no further work up indicated.    MRI normal.  Patient will be DC per Dr. Comer LocketSumner recs.  Given tramadol to use at home for pain control.  Tomasita CrumbleAdeleke Torian Thoennes, MD 05/16/15 [phone]

## 2015-05-16 NOTE — Progress Notes (Signed)
Code Stroke called on [age] female, hx of prior stroke (hemorrhagic) with left side weakness and arthritis. Per daughter at bedside Pt LSN 0045, found at 0130 with slurred speech, left side facial droop and severe pain in left hip. [facility] yet Daughter feels her speech remains slow. Pt does not speak english and is originally from [place]. CT scan completed STAT, negative for bleed per Dr. Hosie PoissonSumner. NIHSS completed with language assist from Pt Daughter, scored 8 for severe weakness in left arm and leg, mild sensory deficit on left leg, and mild dysarthria. Pt not a TPA candidate due to prior ICH. 50 mcg Fentanyl IVP given for hip pain. Pt for MRI now and admit to hospital tonight.

## 2015-05-16 NOTE — Consult Note (Signed)
Stroke Consult Consulting Physician: Dr Mora Bellmanni ED  Chief Complaint: left sided weakness and pain  HPI: Michelle Kemp is an 74 y.o. female hx of prior stroke (with hemorrhagic component), arthritis presenting with increased left sided weakness compared to her baseline. Daughter provides history. Reports that around 0130 patient noted to have drawing of the left side of her face and increased weakness on the left side compared to her baseline. Symptoms have improved though daughter feels her speech is slower. Patients main concern at this time is severe left LE pain, focused mainly in her hip. At baseline she has left sided weakness from prior stroke though daughter reports she has been improving.   CT head imaging reviewed, shows multiple old infarcts but no acute process.   Date last known well: 11/06 Time last known well: 0130 tPA Given: no, prior ICH Modified Rankin: Rankin Score=4  Past Medical History  Diagnosis Date  . Stroke (HCC)   . Arthritis     Past Surgical History  Procedure Laterality Date  . Hernia repair  2001    Family History  Problem Relation Age of Onset  . Arthritis    . Stroke Neg Hx    Social History:  reports that she has never smoked. She does not have any smokeless tobacco history on file. She reports that she does not drink alcohol or use illicit drugs.  Allergies: No Active Allergies   (Not in a hospital admission)  ROS: Out of a complete 14 system review, the patient complains of only the following symptoms, and all other reviewed systems are negative. +weakness, pain  Physical Examination: Filed Vitals:   05/16/15 0441  BP: 178/114  Pulse: 84  Temp: 98 F (36.7 C)  Resp: 18   Physical Exam  Constitutional: He appears well-developed and well-nourished.  Psych: Affect appropriate to situation Eyes: No scleral injection HENT: No OP obstrucion Head: Normocephalic.  Cardiovascular: Normal rate and regular rhythm.  Respiratory: Effort  normal and breath sounds normal.  GI: Soft. Bowel sounds are normal. No distension. There is no tenderness.  Skin: WDI  Neurologic Examination: Mental Status: Alert, oriented, thought content appropriate.  Speech fluent without evidence of aphasia.  Difficulty following multi-step commands Cranial Nerves: II: funduscopic exam wnl bilaterally, visual fields grossly normal, pupils equal, round, reactive to light and accommodation III,IV, VI: ptosis not present, extra-ocular motions intact bilaterally V,VII: left NLF flattening, facial light touch sensation normal bilaterally VIII: hearing normal bilaterally IX,X: gag reflex present XI: trapezius strength/neck flexion strength normal bilaterally XII: tongue strength normal  Motor: Full strength on RUE and RLE Increased tone with flexed posture of LUE.  0/5 proximal and 1/5 distal LUE 3/5 proximal LLE, 0/5 distal *Notes severe pain to palpation in L hip region Sensory: diminished PP on the left side Deep Tendon Reflexes: 2+ and symmetric throughout Plantars: Right: downgoing   Left: equivocal Cerebellar: Unable to test on left side Gait: deferred  Laboratory Studies:   Basic Metabolic Panel:  Recent Labs Lab 05/16/15 0454  NA 141  K 3.7  CL 103  GLUCOSE 90  BUN 17  CREATININE 1.00    Liver Function Tests: No results for input(s): AST, ALT, ALKPHOS, BILITOT, PROT, ALBUMIN in the last 168 hours. No results for input(s): LIPASE, AMYLASE in the last 168 hours. No results for input(s): AMMONIA in the last 168 hours.  CBC:  Recent Labs Lab 05/16/15 0454  HGB 12.9  HCT 38.0    Cardiac Enzymes: No results for input(s):  CKTOTAL, CKMB, CKMBINDEX, TROPONINI in the last 168 hours.  BNP: Invalid input(s): POCBNP  CBG: No results for input(s): GLUCAP in the last 168 hours.  Microbiology: No results found for this or any previous visit.  Coagulation Studies:  Recent Labs  05/16/15 0424  LABPROT 13.7  INR 1.03     Urinalysis: No results for input(s): COLORURINE, LABSPEC, PHURINE, GLUCOSEU, HGBUR, BILIRUBINUR, KETONESUR, PROTEINUR, UROBILINOGEN, NITRITE, LEUKOCYTESUR in the last 168 hours.  Invalid input(s): APPERANCEUR  Lipid Panel:  No results found for: CHOL, TRIG, HDL, CHOLHDL, VLDL, LDLCALC  HgbA1C: No results found for: HGBA1C  Urine Drug Screen:  No results found for: LABOPIA, COCAINSCRNUR, LABBENZ, AMPHETMU, THCU, LABBARB  Alcohol Level:  Recent Labs Lab 05/16/15 0424  ETH <5    Other results:  Imaging: Ct Head Wo Contrast  05/16/2015  CLINICAL DATA:  Stroke-like symptoms EXAM: CT HEAD WITHOUT CONTRAST TECHNIQUE: Contiguous axial images were obtained from the base of the skull through the vertex without intravenous contrast. COMPARISON:  01/18/2015 FINDINGS: There is no intracranial hemorrhage, mass or evidence of acute infarction. There is mild to moderate generalized atrophy. There is periventricular hypodensity consistent with chronic small vessel disease. There are remote infarctions involving the left cerebellar hemisphere, the left globus pallidus, and the right frontal lobe. No interval change is evident from 01/18/2015. No bony abnormalities evident. The visible paranasal sinuses are clear. IMPRESSION: Atrophy and remote infarctions, as well as chronic small vessel disease. No acute findings. Critical Value/emergent results were called by telephone at the time of interpretation on 05/16/2015 at 4:44 am to Dr. Tomasita Crumble , who verbally acknowledged these results. Electronically Signed   By: Ellery Plunk M.D.   On: 05/16/2015 04:45    Assessment: 74 y.o. female hx of prior CVA/ICH, arthritis presenting after daughter noted increased weakness on her left side compared to baseline weakness. Not a tPA candidate due to prior ICH. Patients main concern at this time is left hip pain. Cannot rule out new CVA vs extension of prior infarct.   -MRI brain. If unremarkable, no further  neurological workup indicated at this time -continue ASA  daily -suggest further workup of left hip pain   Elspeth Cho, DO [facility] [phone]  If 7pm- 7am, please page neurology on call as listed in AMION. 05/16/2015, 5:01 AM

## 2015-05-16 NOTE — Discharge Instructions (Signed)
Weakness Ms. Michelle Kemp, your MRI does not show any new stroke.  See your primary care doctor within 3 days for close follow up.  Take tramadol as needed for your hip pain.  If symptoms worsen, come back to the ED immediately.  Thank you. Weakness is a lack of strength. You may feel weak all over your body or just in one part of your body. Weakness can be serious. In some cases, you may need more medical tests. HOME CARE  Rest.  Eat a well-balanced diet.  Try to exercise every day.  Only take medicines as told by your doctor. GET HELP RIGHT AWAY IF:   You cannot do your normal daily activities.  You cannot walk up and down stairs, or you feel very tired when you do so.  You have shortness of breath or chest pain.  You have trouble moving parts of your body.  You have weakness in only one body part or on only one side of the body.  You have a fever.  You have trouble speaking or swallowing.  You cannot control when you pee (urinate) or poop (bowel movement).  You have black or bloody throw up (vomit) or poop.  Your weakness gets worse or spreads to other body parts.  You have new aches or pains. MAKE SURE YOU:   Understand these instructions.  Will watch your condition.  Will get help right away if you are not doing well or get worse.   This information is not intended to replace advice given to you by your health care provider. Make sure you discuss any questions you have with your health care provider.   Document Released: 06/08/2008 Document Revised: 12/26/2011 Document Reviewed: 08/25/2011 Elsevier Interactive Patient Education [employer]2016 Elsevier Inc.

## 2015-05-16 NOTE — ED Notes (Signed)
Pt. Has a history of stroke that has resulted in left sided deficits. This morning pt. Was unable to move left side completely. Family called EMS and EMS called a code stroke. Pt. Does not speak english

## 2015-05-17 ENCOUNTER — Telehealth: Payer: Self-pay

## 2015-05-17 ENCOUNTER — Telehealth: Payer: Self-pay | Admitting: Neurology

## 2015-05-17 NOTE — Telephone Encounter (Signed)
Rn call patients daughter back Raynelle FanningJulie to state her mother 2 d echo was normal. Pts daughter verbalized understanding.

## 2015-05-17 NOTE — Telephone Encounter (Signed)
-----   Message from Marvel PlanJindong Xu, MD sent at 05/14/2015  3:34 PM EDT ----- Jovita GammaHi, katrina:  Please let the pt know that her 2D echo done the other day was normal. Continue current management. Thanks.  Marvel PlanJindong Xu, MD PhD Stroke Neurology 05/14/2015 3:34 PM

## 2015-05-17 NOTE — Telephone Encounter (Signed)
Also in phone conversation with daughter Raynelle FanningJulie, she stated that Dr. Baxter HireKristen office did not return a phone call about the injections for her knee and arm. Rn stated the referral was sent 04/12/2015 and to just call them back to find out the status of the referral.

## 2015-05-17 NOTE — Telephone Encounter (Signed)
Open in error

## 2015-05-17 NOTE — Telephone Encounter (Signed)
LFt vm for patients daughter to call back about 2d echo results. Mother does not speak english.

## 2015-05-17 NOTE — Telephone Encounter (Signed)
[name] is calling back for results.  Please call [phone] or email [email or web address].

## 2015-05-19 ENCOUNTER — Encounter: Payer: Self-pay | Admitting: Physical Therapy

## 2015-05-19 ENCOUNTER — Ambulatory Visit: Payer: Self-pay | Admitting: Physical Therapy

## 2015-05-19 VITALS — BP 161/94 | HR 76

## 2015-05-19 DIAGNOSIS — R2681 Unsteadiness on feet: Secondary | ICD-10-CM

## 2015-05-19 DIAGNOSIS — R293 Abnormal posture: Secondary | ICD-10-CM

## 2015-05-19 DIAGNOSIS — G8110 Spastic hemiplegia affecting unspecified side: Secondary | ICD-10-CM

## 2015-05-19 DIAGNOSIS — R269 Unspecified abnormalities of gait and mobility: Secondary | ICD-10-CM

## 2015-05-19 DIAGNOSIS — M6281 Muscle weakness (generalized): Secondary | ICD-10-CM

## 2015-05-20 NOTE — Therapy (Signed)
[facility] [address] [place], [place], [address] Phone: [phone]   Fax:  [phone]  Physical Therapy Treatment  Patient Details  Name: Michelle Kemp MRN: [id number] Date of Birth: [birth year] No Data Recorded  Encounter Date: 05/19/2015      PT End of Session - 05/19/15 1530    Visit Number 21   Number of Visits 32   Date for PT Re-Evaluation 06/08/15   Authorization Type Self pay, no eligibility to apply for benefits per daughter's reports   PT Start Time 7253   PT Stop Time 1530   PT Time Calculation (min) 44 min   Activity Tolerance Patient tolerated treatment well   Behavior During Therapy [facility] for tasks assessed/performed      Past Medical History  Diagnosis Date  . Stroke ([place])   . Arthritis     Past Surgical History  Procedure Laterality Date  . Hernia repair  2001    Filed Vitals:   05/19/15 1505  BP: 161/94  Pulse: 76    Visit Diagnosis:  Abnormality of gait  Spastic hemiplegia affecting nondominant side (HCC)  Muscle weakness  Unsteadiness on feet  Postural instability      Subjective Assessment - 05/20/15 1054    Subjective Was reported that pt was seen in ED for possible new CVA by caregiver and pt's daughter [name] (via phone). ED notes reviewed and stated no acute findings with tests and imaging. Pt's family also report pt has appointment with her neurologist this week at [facility]. Jamey Reas, PT notified and performed brief reassessment (refer to clinial impression statement for further details). No falls reported. Pt complaint with all medications. BP checked and was a little elevated today. Will continue to monitor with session.                                                             [facility] Adult PT Treatment/Exercise - 05/19/15 1516    Transfers   Transfers Sit to Stand;Stand to Sit;Stand Pivot Transfers   Sit to Stand 3: Mod assist;4: Min assist;With upper  extremity assist;From chair/3-in-1;With armrests   Sit to Stand Details Verbal cues for technique;Verbal cues for sequencing;Manual facilitation for weight shifting;Manual facilitation for weight bearing;Visual cues/gestures for sequencing;Tactile cues for posture;Tactile cues for placement   Sit to Stand Details (indicate cue type and reason) mulitmodal cues to scoot forward in chair, use arm to push up rather than pull up on walker, for bil foot placement and for weight shifting forward. assist needed to power up into standing.                          Stand to Sit 3: Mod assist;To chair/3-in-1;With upper extremity assist;Uncontrolled descent   Stand to Sit Details (indicate cue type and reason) Visual cues/gestures for precautions/safety;Visual cues/gestures for sequencing;Manual facilitation for weight shifting;Manual facilitation for placement;Manual facilitation for weight bearing;Verbal cues for precautions/safety;Tactile cues for weight beaing;Tactile cues for placement   Stand to Sit Details cues to reach back to for safety and to assist with controlled descent into chair. total assist needed to remove left UE from platform each time.  Ambulation/Gait   Ambulation/Gait Yes   Ambulation/Gait Assistance 3: Mod assist  2 people for safety, plus 3rd for chair follow   Ambulation/Gait Assistance Details multimodal cues on posture, sequence with gait and to correct gait deviations. pt able to self advance left leg today with minimal assist needed for initial contact control, progressing to min assist to advance as well when pt fatiqued. No buckling noted in stance on left leg in exception to 1x after second walk with pt assisting in balance recovery.                            Ambulation Distance (Feet) 15 Feet  x1, 25 x1   Assistive device Left platform walker   Gait Pattern Decreased stance time - left;Step-to pattern;Decreased dorsiflexion - left;Decreased weight  shift to left;Trunk rotated posteriorly on left;Left flexed knee in stance;Decreased hip/knee flexion - left;Decreased step length - right;Trunk flexed;Poor foot clearance - left   Ambulation Surface Level;Indoor     Pt seen along with Gerald Stabs and Marcello Moores from [place] [facility] to assess for orthotic needs to left leg with gait. Two possible options would be custom plastic brace or double upright metal bracing. Pt noted to have bil collapsed arches and fluctuating swelling that further complicate the brace needed. Pt's daughter participating over phone as best as possible, however it was decided to return to this topic at pt's appointment on Friday when all parties involved can be present to decide on most appropriate course for bracing.         PT Short Term Goals - 05/14/15 1453    PT SHORT TERM GOAL #1   Title (p) The patient will tolerate standing program with family assistance x 5 minutes for improved weight bearing and assist for ADLs. Target date 05/09/2015   Baseline (p) 11/2: Achieved, per pt report.   Status (p) Achieved   PT SHORT TERM GOAL #2   Title (p) The patient will move sit<>supine with CGA. Target date 05/09/2015   Baseline (p) 11/2: pt required min to mod A and 50% cueing.   Status (p) Not Met   PT SHORT TERM GOAL #3   Title (p) The patient will transfer w/c<>mat with sliding board with CGA of 1 person. Target date 05/09/2015   Baseline (p) Deferred, as pt currently performing standing pivot transfers with family.   Status (p) Deferred   PT SHORT TERM GOAL #4   Title (p) The patient will propel manual wheelchair with minimal verbal cues.   Baseline (p) Target date 05/09/2015   Status (p) Not Met   PT SHORT TERM GOAL #5   Title (p) The patient will ambulate in parallel bars with +2 assist x 5 feet to demo improved postural control and strength for standing tasks. Target date 05/09/2015   Baseline (p) 11/2: Pt has consistently ambulated with min to max A using L PFRW.    Status (p) Achieved           PT Long Term Goals - 04/09/15 1620    PT LONG TERM GOAL #1   Title The patient/family will verbalize understanding of CVA warning signs/risk factors.   Baseline Target date 06/08/2015   Time 8   Period Weeks   Status On-going   PT LONG TERM GOAL #2   Title The patient will move sit<>stand with CGA of 1 person with UE support.   Baseline Target date 06/08/2015   Time 8  Period Weeks   Status New   PT LONG TERM GOAL #3   Title The patient will transfer w/c<>mat with squat pivot or boost transfer with CGA for safety.   Baseline Target date 06/08/2015   Time 8   Period Weeks   Status New   PT LONG TERM GOAL #4   Title The patient will ambulate x 10 feet with mod A of 1 person with least restrictive assistive device.   Baseline Target date 06/08/2015   Time 8   Period Weeks   Status New   PT LONG TERM GOAL #5   Title The patient and family will demo indep with HEP for post d/c plan.   Baseline Target date 06/08/2015   Time 8   Period Weeks   Status New           Plan - 05/19/15 1530    Clinical Impression Statement Pt assessed by Jamey Reas, PT prior to initation of session, along with family interview (caregiver and daughter via phone). No acute finding noted from ED notes and both family members report the pt is moving/transfering the same as before being seen in the ED. Proceeded with session with limited activity due to elevated BP. Pt with increased activity tolerance today vs her previous session. No significant changes noted in pt's mobility or assitance needed with mobility compared to previous sessions. Pt did report feeling less pain after gait trials today. PT also seen with [facility] for gait today to assess for left bracing options to assist with left leg management with gait. Marcello Moores to return on Friday's session when pt's daughter can be present for fulling imformed decisions to be made on which bracing option to proceed  with. Pt making progress toward goals.                                             Pt will benefit from skilled therapeutic intervention in order to improve on the following deficits Abnormal gait;Decreased balance;Decreased mobility;Decreased strength;Postural dysfunction;Impaired flexibility;Pain;Decreased activity tolerance;Decreased endurance;Increased muscle spasms;Difficulty walking   Rehab Potential Good   Clinical Impairments Affecting Rehab Potential patient private pay for services   PT Frequency 2x / week   PT Duration 8 weeks   PT Treatment/Interventions Electrical Stimulation;Gait training;Stair training;Neuromuscular re-education;Balance training;Therapeutic exercise;Therapeutic activities;Functional mobility training;Patient/family education;Wheelchair mobility training;Manual techniques;Orthotic Fit/Training   PT Next Visit Plan  Bed mobility (both for function and L NMR). Seated scooting with symmetrical LE WB. Continue gait training.   Consulted and Agree with Plan of Care Patient;Family member/caregiver   Family Member Consulted family friend        Problem List Patient Active Problem List   Diagnosis Date Noted  . 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 Hemiparesis ([place]) 02/03/2015  . Spastic hemiplegia affecting nondominant side ([place]) 02/03/2015    Willow Ora 05/20/2015, 11:29 AM  Willow Ora, PTA, [facility] [address], [place],  [address] [phone] 05/20/2015, 11:29 AM   Name: Lainy Wrobleski MRN: [id number] Date of Birth: Nov 26, 1940

## 2015-05-21 ENCOUNTER — Ambulatory Visit: Payer: Self-pay | Admitting: Physical Therapy

## 2015-05-25 ENCOUNTER — Ambulatory Visit: Payer: Self-pay | Admitting: Occupational Therapy

## 2015-05-25 ENCOUNTER — Ambulatory Visit: Payer: Self-pay | Admitting: Physical Therapy

## 2015-05-25 ENCOUNTER — Encounter: Payer: Self-pay | Admitting: Occupational Therapy

## 2015-05-25 DIAGNOSIS — G8110 Spastic hemiplegia affecting unspecified side: Secondary | ICD-10-CM

## 2015-05-25 DIAGNOSIS — R269 Unspecified abnormalities of gait and mobility: Secondary | ICD-10-CM

## 2015-05-25 DIAGNOSIS — M25612 Stiffness of left shoulder, not elsewhere classified: Secondary | ICD-10-CM

## 2015-05-25 DIAGNOSIS — M25512 Pain in left shoulder: Secondary | ICD-10-CM

## 2015-05-25 NOTE — Therapy (Signed)
[facility] [address] [place], [place], [address] Phone: [phone]   Fax:  [phone]  Patient Details  Name: Michelle Kemp MRN: [id number] Date of Birth: 09/22/1940 Referring Provider:  Jackie Plumsei-Bonsu, George, MD  Encounter Date: 05/25/2015  No billable services provided today, as orthotist present to assess/address pt need for L AFO for L ankle DF assist, consistent LLE clearance during gait. Orthotist, patient, and therapists all in agreement to pursue metal double-upright dorsiflexion assist AFO for L shoe.     Jorje GuildBlair Jaedan Huttner, PT, DPT [facility] [address] [place], [place], [address] Phone: [phone]   Fax:  [phone] 05/25/2015, 8:54 PM

## 2015-05-25 NOTE — Therapy (Signed)
[facility] [address] [place], [place], [address] Phone: [phone]   Fax:  [phone]  Occupational Therapy Treatment  Patient Details  Name: Michelle Kemp MRN: [id number] Date of Birth: 1940/11/27 Referring Provider: Sarina Ill  Encounter Date: 05/25/2015      OT End of Session - 05/25/15 1703    Visit Number 16   Number of Visits 30   Date for OT Re-Evaluation 07/09/15   Authorization Type self pay      Past Medical History  Diagnosis Date  . Stroke ([place])   . Arthritis     Past Surgical History  Procedure Laterality Date  . Hernia repair  2001    There were no vitals filed for this visit.  Visit Diagnosis:  Spastic hemiplegia affecting nondominant side ([place]) - Plan: Ot plan of care cert/re-cert  Stiffness of shoulder joint, left - Plan: Ot plan of care cert/re-cert  Pain in joint, shoulder region, left - Plan: Ot plan of care cert/re-cert      Subjective Assessment - 05/25/15 1641    Subjective  Patient indicates left leg is very painful today   Patient is accompained by: Family member   Currently in Pain? Yes   Pain Score --  unable to score - wincing on occasion   Pain Location Hip   Pain Orientation Left   Pain Descriptors / Indicators Aching   Pain Type Chronic pain   Pain Onset More than a month ago   Pain Frequency Intermittent   Aggravating Factors  stiffness   Pain Relieving Factors rest   Multiple Pain Sites Yes   Pain Score --  unable to score- winces with motion   Pain Location Shoulder   Pain Orientation Left   Pain Descriptors / Indicators Aching   Pain Type Chronic pain   Pain Onset More than a month ago   Pain Frequency Intermittent   Aggravating Factors  shoulder joint motion   Pain Relieving Factors rest                      OT Treatments/Exercises (OP) - 05/25/15 0001    Neurological Re-education Exercises   Other Exercises 1 Patient  tearful today, asking will I get well?  Patient with increased report of pain in left hip and shoulder,a nd increased tension present throughout both extremities.  Used very gentle trunk mobility to aide with alleviating discomfort in shoulder and hip.  Patient with strong preference to keep mass to right of center - pulling away from left extremities, when working to provide gentle motion, patient did best with facilitation to weight shift toward left, and even tip head toward left.  Patient with increased freedom of shoulder, elbow joint motion following gentle moblization throughout trunk, head and neck and left upper extremity.  Patient able to tolerate approximately 60 degrees of shoulder abduction and flexion following this activity.                  OT Education - 05/25/15 1653    Education provided Yes   Education Details Provided encouragement and reviewed patient's progress - tearful today   Person(s) Educated Patient;Caregiver(s)   Methods Explanation;Verbal cues   Comprehension Verbalized understanding;Need further instruction          OT Short Term Goals - 03/31/15 1618    OT SHORT TERM GOAL #1   Title Patient will don pull over shirt without assistance (goals due 03/19/15)   Status  Not Met   OT SHORT TERM GOAL #2   Title Patient will transition from sit to stand with mod assist as a precursos to eventually allow family to adjust clothing prior to and following toileting   Status Achieved   OT SHORT TERM GOAL #3   Title Patient will report pain no greater than 2-3/10 after positioning in bed by caregivers to allow her to sleep restfully   Status Achieved   OT SHORT TERM GOAL #4   Title Patient will reach forward toward lower legs and feet and sustaing position for 10 seconds to aide with lower body bathing and dressing.   Status Achieved   OT SHORT TERM GOAL #5   Title Patient will demonstrate active grasp and release  in left hand with minimal facilitation, to aide  with meal prep tasks as a precursor to simple cooking task   Status Achieved           OT Long Term Goals - 05/05/15 1710    OT LONG TERM GOAL #1   Title Patient will don lower body clothing with moderate assistance while seated, adn transition to standing to pull up clothing (goals due 10/7)   Status On-going   OT LONG TERM GOAL #2   Title Patient will transfer to commode with moderate assistance for toileting   Status Achieved   OT LONG TERM GOAL #3   Title Patient will stand with no greater than min assist to allow family to complete toilet hygiene    Status Achieved   OT LONG TERM GOAL #4   Title Patient will be able to reposition left arm for comfort in upright and reclined positions - such that pain experienced is no greater than 2/10   Status On-going   OT LONG TERM GOAL #5   Title Patient will prepare simple meal with moderate assistance from family members   Status On-going               Plan - 05/25/15 1655    Clinical Impression Statement Patient is reaching the end of this plan of care, and family is hopeful that therapy will continue to further advance mobility skills and decrease caregiver burden.     Pt will benefit from skilled therapeutic intervention in order to improve on the following deficits (Retired) Decreased activity tolerance;Decreased balance;Decreased coordination;Decreased endurance;Impaired flexibility;Impaired perceived functional ability;Decreased safety awareness;Decreased range of motion;Decreased mobility;Decreased knowledge of use of DME;Decreased knowledge of precautions;Decreased strength;Increased edema;Increased muscle spasms;Impaired sensation;Pain;Impaired tone;Impaired UE functional use;Impaired vision/preception   Rehab Potential Good   Clinical Impairments Affecting Rehab Potential limited financial resources   OT Frequency 2x / week   OT Duration 8 weeks   OT Treatment/Interventions Self-care/ADL training;Electrical  Stimulation;Cryotherapy;Moist Heat;Visual/perceptual remediation/compensation;Patient/family education;Balance training;Splinting;Therapeutic exercises;Therapeutic activities;Neuromuscular education;Functional Mobility Training;Fluidtherapy;Biofeedback;DME and/or AE instruction   Plan Functional mobility - sit to stand, toilet transfers, ADL, NMR LUE   Consulted and Agree with Plan of Care Patient;Family member/caregiver        Problem List Patient Active Problem List   Diagnosis Date Noted  . 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 Hemiparesis ([place]) 02/03/2015  . Spastic hemiplegia affecting nondominant side ([place]) 02/03/2015    Mariah Milling, OTR/L 05/25/2015, 5:04 PM  [facility] [address] [place], [place], [address] Phone: [phone]   Fax:  [phone]  Name: Michelle Kemp MRN: [id number] Date of Birth: 12/18/1940

## 2015-05-28 ENCOUNTER — Ambulatory Visit: Payer: Self-pay | Admitting: Occupational Therapy

## 2015-05-28 ENCOUNTER — Ambulatory Visit: Payer: Self-pay | Admitting: Physical Therapy

## 2015-05-28 ENCOUNTER — Encounter: Payer: Self-pay | Admitting: Occupational Therapy

## 2015-05-28 ENCOUNTER — Encounter: Payer: Self-pay | Admitting: Physical Therapy

## 2015-05-28 DIAGNOSIS — R293 Abnormal posture: Secondary | ICD-10-CM

## 2015-05-28 DIAGNOSIS — M25542 Pain in joints of left hand: Secondary | ICD-10-CM

## 2015-05-28 DIAGNOSIS — G8110 Spastic hemiplegia affecting unspecified side: Secondary | ICD-10-CM

## 2015-05-28 DIAGNOSIS — M6281 Muscle weakness (generalized): Secondary | ICD-10-CM

## 2015-05-28 DIAGNOSIS — R269 Unspecified abnormalities of gait and mobility: Secondary | ICD-10-CM

## 2015-05-28 DIAGNOSIS — M25612 Stiffness of left shoulder, not elsewhere classified: Secondary | ICD-10-CM

## 2015-05-28 DIAGNOSIS — R2681 Unsteadiness on feet: Secondary | ICD-10-CM

## 2015-05-28 NOTE — Therapy (Signed)
[facility] [address] [place], [place], [address] Phone: [phone]   Fax:  [phone]  Occupational Therapy Treatment  Patient Details  Name: Michelle Kemp MRN: [id number] Date of Birth: 29-Apr-1941 Referring Provider: Sarina Ill  Encounter Date: 05/28/2015      OT End of Session - 05/28/15 1525    Visit Number 17   Number of Visits 30   Date for OT Re-Evaluation 07/09/15   Authorization Type self pay   OT Start Time 1403   OT Stop Time 1447   OT Time Calculation (min) 44 min   Activity Tolerance Patient tolerated treatment well   Behavior During Therapy [facility] for tasks assessed/performed      Past Medical History  Diagnosis Date  . Stroke ([place])   . Arthritis     Past Surgical History  Procedure Laterality Date  . Hernia repair  2001    There were no vitals filed for this visit.  Visit Diagnosis:  Spastic hemiplegia affecting nondominant side (HCC)  Postural instability  Stiffness of shoulder joint, left  Joint pain in fingers of left hand      Subjective Assessment - 05/28/15 1506    Subjective  Patient indicating wanting to walk today   Patient is accompained by: Family member   Currently in Pain? Yes   Pain Score --  unable to score   Pain Location Knee   Pain Orientation Right;Left   Pain Descriptors / Indicators Aching   Pain Type Chronic pain   Pain Onset More than a month ago   Pain Frequency Intermittent   Aggravating Factors  arthritis, left leg stiffness   Pain Relieving Factors rest                      OT Treatments/Exercises (OP) - 05/28/15 0001    ADLs   Cooking Patient walked approximately 10 feet with minimal assistance once standing, using platform walker.  Patient requires increased time, and min facilitation to assist with walker placement and weight shift off of left leg.  Patient able to stand at counter top with min assist to scramble eggs.   Patient required total assistance to passively incorporate left upper extremity into this functional task, although once engaged, able to use left arm to slide glass bowl along counter top toward the right with this hand, while taking small shuffling steps!  Patient required max to mod assist to transition from sit to stand from low wheelchair (without cushion.     Neurological Re-education Exercises   Shoulder Flexion Self ROM;Left;10 reps;Seated   Shoulder ABduction Self ROM;Left;Seated;10 reps   Shoulder Protraction Self ROM;Left;10 reps;Seated   Elbow Flexion Self ROM;Left;10 reps;Seated   Elbow Extension Self ROM   Forearm Supination Left;10 reps;Seated   Wrist Flexion Self ROM;Left;10 reps;Seated   Wrist Extension Self ROM;Left;10 reps;Seated                OT Education - 05/28/15 1525    Education provided Yes   Education Details Self Range of Motion, shoulder, elbow, and wrist at tabletop   Person(s) Educated Patient   Methods Explanation;Demonstration;Verbal cues   Comprehension Returned demonstration;Tactile cues required;Verbal cues required;Need further instruction          OT Short Term Goals - 03/31/15 1618    OT SHORT TERM GOAL #1   Title Patient will don pull over shirt without assistance (goals due 03/19/15)   Status Not Met   OT SHORT  TERM GOAL #2   Title Patient will transition from sit to stand with mod assist as a precursos to eventually allow family to adjust clothing prior to and following toileting   Status Achieved   OT SHORT TERM GOAL #3   Title Patient will report pain no greater than 2-3/10 after positioning in bed by caregivers to allow her to sleep restfully   Status Achieved   OT SHORT TERM GOAL #4   Title Patient will reach forward toward lower legs and feet and sustaing position for 10 seconds to aide with lower body bathing and dressing.   Status Achieved   OT SHORT TERM GOAL #5   Title Patient will demonstrate active grasp and release  in  left hand with minimal facilitation, to aide with meal prep tasks as a precursor to simple cooking task   Status Achieved           OT Long Term Goals - 05/05/15 1710    OT LONG TERM GOAL #1   Title Patient will don lower body clothing with moderate assistance while seated, adn transition to standing to pull up clothing (goals due 10/7)   Status On-going   OT LONG TERM GOAL #2   Title Patient will transfer to commode with moderate assistance for toileting   Status Achieved   OT LONG TERM GOAL #3   Title Patient will stand with no greater than min assist to allow family to complete toilet hygiene    Status Achieved   OT LONG TERM GOAL #4   Title Patient will be able to reposition left arm for comfort in upright and reclined positions - such that pain experienced is no greater than 2/10   Status On-going   OT LONG TERM GOAL #5   Title Patient will prepare simple meal with moderate assistance from family members   Status On-going               Plan - 05/28/15 1526    Clinical Impression Statement Patient has shown significant progress since onset of OP therapy, and is making functional improvements due to improved functional mobility, and excellent family support.     Pt will benefit from skilled therapeutic intervention in order to improve on the following deficits (Retired) Decreased activity tolerance;Decreased balance;Decreased coordination;Decreased endurance;Impaired flexibility;Impaired perceived functional ability;Decreased safety awareness;Decreased range of motion;Decreased mobility;Decreased knowledge of use of DME;Decreased knowledge of precautions;Decreased strength;Increased edema;Increased muscle spasms;Impaired sensation;Pain;Impaired tone;Impaired UE functional use;Impaired vision/preception   Rehab Potential Good   OT Frequency 2x / week   OT Duration 8 weeks   OT Treatment/Interventions Self-care/ADL training;Electrical Stimulation;Cryotherapy;Moist  Heat;Visual/perceptual remediation/compensation;Patient/family education;Balance training;Splinting;Therapeutic exercises;Therapeutic activities;Neuromuscular education;Functional Mobility Training;Fluidtherapy;Biofeedback;DME and/or AE instruction   Plan Finalize resting hand splint, functional mobility - toilet transfers - try with ambulation, ADL   OT Home Exercise Plan on going   Consulted and Agree with Plan of Care Patient;Family member/caregiver        Problem List Patient Active Problem List   Diagnosis Date Noted  . 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 Hemiparesis ([place]) 02/03/2015  . Spastic hemiplegia affecting nondominant side ([place]) 02/03/2015    Mariah Milling, OTR/L 05/28/2015, 3:29 PM  [facility] [address] [place], [place], [address] Phone: [phone]   Fax:  [phone]  Name: Michelle Kemp MRN: [id number] Date of Birth: [birth year]

## 2015-05-28 NOTE — Therapy (Signed)
[facility] [address] [place], [place], [address] Phone: [phone]   Fax:  [phone]  Physical Therapy Treatment  Patient Details  Name: Michelle Kemp MRN: [id number] Date of Birth: 1941/05/13 No Data Recorded  Encounter Date: 05/28/2015      PT End of Session - 05/28/15 1452    Visit Number 22   Number of Visits 32   Authorization Type Self pay, no eligibility to apply for benefits per daughter's reports   PT Start Time 9323   PT Stop Time 1400   PT Time Calculation (min) 45 min   Equipment Utilized During Treatment Gait belt   Activity Tolerance Patient tolerated treatment well   Behavior During Therapy [facility] for tasks assessed/performed      Past Medical History  Diagnosis Date  . Stroke ([place])   . Arthritis     Past Surgical History  Procedure Laterality Date  . Hernia repair  2001    There were no vitals filed for this visit.  Visit Diagnosis:  Spastic hemiplegia affecting nondominant side (HCC)  Abnormality of gait  Muscle weakness  Unsteadiness on feet  Postural instability      Subjective Assessment - 05/28/15 1500    Subjective Pt c/o pain in left knee during sit to stand. Pt was excited about what she accomplished in physical therapy.    Patient Stated Goals Improve mobility and endurance   Currently in Pain? Yes   Pain Location Knee   Pain Orientation Left   Pain Descriptors / Indicators Aching   Pain Type Chronic pain   Pain Onset More than a month ago   Pain Frequency Intermittent   Aggravating Factors  stiffness   Pain Relieving Factors rest   Multiple Pain Sites No   Pain Onset More than a month ago     Therapeutic Activities: To promote increase in pt's functional mobility by facilitating use of L affected side.  1. Stand pivot transfer from [facility] to stationary mat: Pt mod assist with verbal and tactile cues for pushing to stand, foot placement and hand placement. Pt  with increased knee extensor tone in L LE with sit/stand.  2. Sit to stand: Pt completed 5 sit/stands during treatment with mod assist with verbal cues for pushing, hand placement and foot placement. Pt required mod assistance to flex left knee back in sitting in preparation for standing.  3. Scooting on edge of mat: Pt completed 2 reps of scooting to the left. Each rep consisted of multiple pushes with R UE to scoot 1 ft to her left. Pt required mod assistance to scoot L hip back on the mat.  Gait Training:  1. Pt ambulated 50f X2 with mod assist +2. Pt required verbal and tactile cues for weight shifting, advancement of L LE, and hip extension for upright posture.        PT Short Term Goals - 05/28/15 1459    PT SHORT TERM GOAL #1   Title The patient will tolerate standing program with family assistance x 5 minutes for improved weight bearing and assist for ADLs. Target date 05/09/2015   Baseline 11/2: Achieved, per pt report.   Status Achieved   PT SHORT TERM GOAL #2   Title The patient will move sit<>supine with CGA. Target date 05/09/2015   Baseline 11/2: pt required min to mod A and 50% cueing.   Status Not Met   PT SHORT TERM GOAL #3   Title The patient will transfer  w/c<>mat with sliding board with CGA of 1 person. Target date 05/09/2015   Baseline Deferred, as pt currently performing standing pivot transfers with family.   Status Deferred   PT SHORT TERM GOAL #4   Title The patient will propel manual wheelchair with minimal verbal cues.   Baseline Target date 05/09/2015   Status Not Met   PT SHORT TERM GOAL #5   Title The patient will ambulate in parallel bars with +2 assist x 5 feet to demo improved postural control and strength for standing tasks. Target date 05/09/2015   Baseline 11/2: Pt has consistently ambulated with min to max A using L PFRW.   Status Achieved           PT Long Term Goals - 05/28/15 1459    PT LONG TERM GOAL #1   Title The patient/family  will verbalize understanding of CVA warning signs/risk factors.   Baseline Target date 06/08/2015   Time 8   Period Weeks   Status On-going   PT LONG TERM GOAL #2   Title The patient will move sit<>stand with CGA of 1 person with UE support.   Baseline Target date 06/08/2015   Time 8   Period Weeks   Status New   PT LONG TERM GOAL #3   Title The patient will transfer w/c<>mat with squat pivot or boost transfer with CGA for safety.   Baseline Target date 06/08/2015   Time 8   Period Weeks   Status New   PT LONG TERM GOAL #4   Title The patient will ambulate x 10 feet with mod A of 1 person with least restrictive assistive device.   Baseline Target date 06/08/2015   Time 8   Period Weeks   Status New   PT LONG TERM GOAL #5   Title The patient and family will demo indep with HEP for post d/c plan.   Baseline Target date 06/08/2015   Time 8   Period Weeks   Status New               Plan - 05/28/15 1452    Clinical Impression Statement Pt progressing towards LTG's. Pt does not have L AFO as of yet. Pt not able to fully extend L LE, but is able to sustain contraction of quads to resist buckling in standing and gait. Pt mod assist for sit/stand and mod assist plus 2 for gait. Pt continues to be limited in function today due to pain and tightness in the L LE.   PT Next Visit Plan Continue UE work in standing. Continue gait and transfer training to increase functional independence and reduce work of caregivers.   Consulted and Agree with Plan of Care Patient;Family member/caregiver   Family Member Consulted family friend        Problem List Patient Active Problem List   Diagnosis Date Noted  . 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 Hemiparesis ([place]) 02/03/2015  . Spastic hemiplegia affecting nondominant side ([place]) 02/03/2015    Laney Potash 05/28/2015, 3:05 PM  Laney Potash, SPTA  Name: Marvel Sapp MRN: [id number] Date of Birth: 1941-04-29  This  note has been reviewed and edited by supervising CI.  Willow Ora, PTA, [facility] [address], [place], [place] [address] [phone] 05/30/2015, 10:10 PM

## 2015-05-31 ENCOUNTER — Ambulatory Visit: Payer: Self-pay | Admitting: Occupational Therapy

## 2015-05-31 ENCOUNTER — Encounter: Payer: Self-pay | Admitting: Occupational Therapy

## 2015-05-31 DIAGNOSIS — R293 Abnormal posture: Secondary | ICD-10-CM

## 2015-05-31 DIAGNOSIS — G8110 Spastic hemiplegia affecting unspecified side: Secondary | ICD-10-CM

## 2015-05-31 DIAGNOSIS — M25542 Pain in joints of left hand: Secondary | ICD-10-CM

## 2015-05-31 NOTE — Therapy (Signed)
[facility] [address] [place], [place], [address] Phone: [phone]   Fax:  [phone]  Occupational Therapy Treatment  Patient Details  Name: Michelle Kemp MRN: [id number] Date of Birth: 01-15-1941 Referring Provider: Sarina Ill  Encounter Date: 05/31/2015      OT End of Session - 05/31/15 1507    Visit Number 18   Number of Visits 30   Date for OT Re-Evaluation 07/09/15   Authorization Type self pay   OT Start Time 1315   OT Stop Time 1400   OT Time Calculation (min) 45 min      Past Medical History  Diagnosis Date  . Stroke ([place])   . Arthritis     Past Surgical History  Procedure Laterality Date  . Hernia repair  2001    There were no vitals filed for this visit.  Visit Diagnosis:  Spastic hemiplegia affecting nondominant side (HCC)  Postural instability  Joint pain in fingers of left hand      Subjective Assessment - 05/31/15 1459    Subjective  Patient indicates no new pain today.   Patient is accompained by: Family member   Currently in Pain? No/denies                      OT Treatments/Exercises (OP) - 05/31/15 0001    ADLs   Cooking Patient able to transition sit to stand with max assist, from low wheelchair, subsequent stands from regular kitchen chair were needing only mod assist.  Patient required max assist initially to walk to countertop with platform walker, although with repetition assistance decreased to moderate.  Patient able to stand at counte top to pour and mix pudding.  Patient at times with no physical assistance while using right hand for functional activity.     Splinting   Splinting Fabricated resting hand splint for right hand to promote increased passive range of motion in wrist and digits, and to prevent further tightness.  Patient able to doff splint with minimla assistance.  Family member able to don, and demonstrated understanding of signs and  symptoms to watch for, and wearing schedule.                  OT Education - 05/31/15 1506    Education provided Yes   Education Details splint wear and care   Person(s) Educated Patient;Caregiver(s)   Methods Explanation;Demonstration;Verbal cues;Handout   Comprehension Verbalized understanding;Returned demonstration          OT Short Term Goals - 03/31/15 1618    OT SHORT TERM GOAL #1   Title Patient will don pull over shirt without assistance (goals due 03/19/15)   Status Not Met   OT SHORT TERM GOAL #2   Title Patient will transition from sit to stand with mod assist as a precursos to eventually allow family to adjust clothing prior to and following toileting   Status Achieved   OT SHORT TERM GOAL #3   Title Patient will report pain no greater than 2-3/10 after positioning in bed by caregivers to allow her to sleep restfully   Status Achieved   OT SHORT TERM GOAL #4   Title Patient will reach forward toward lower legs and feet and sustaing position for 10 seconds to aide with lower body bathing and dressing.   Status Achieved   OT SHORT TERM GOAL #5   Title Patient will demonstrate active grasp and release  in left hand with minimal  facilitation, to aide with meal prep tasks as a precursor to simple cooking task   Status Achieved           OT Long Term Goals - 05/05/15 1710    OT LONG TERM GOAL #1   Title Patient will don lower body clothing with moderate assistance while seated, adn transition to standing to pull up clothing (goals due 10/7)   Status On-going   OT LONG TERM GOAL #2   Title Patient will transfer to commode with moderate assistance for toileting   Status Achieved   OT LONG TERM GOAL #3   Title Patient will stand with no greater than min assist to allow family to complete toilet hygiene    Status Achieved   OT LONG TERM GOAL #4   Title Patient will be able to reposition left arm for comfort in upright and reclined positions - such that pain  experienced is no greater than 2/10   Status On-going   OT LONG TERM GOAL #5   Title Patient will prepare simple meal with moderate assistance from family members   Status On-going               Plan - 05/31/15 1522    Pt will benefit from skilled therapeutic intervention in order to improve on the following deficits (Retired) Decreased activity tolerance;Decreased balance;Decreased coordination;Decreased endurance;Impaired flexibility;Impaired perceived functional ability;Decreased safety awareness;Decreased range of motion;Decreased mobility;Decreased knowledge of use of DME;Decreased knowledge of precautions;Decreased strength;Increased edema;Increased muscle spasms;Impaired sensation;Pain;Impaired tone;Impaired UE functional use;Impaired vision/preception   Clinical Impairments Affecting Rehab Potential limited financial resources   OT Frequency 2x / week   OT Duration 8 weeks   OT Treatment/Interventions Self-care/ADL training;Electrical Stimulation;Cryotherapy;Moist Heat;Visual/perceptual remediation/compensation;Patient/family education;Balance training;Splinting;Therapeutic exercises;Therapeutic activities;Neuromuscular education;Functional Mobility Training;Fluidtherapy;Biofeedback;DME and/or AE instruction   Plan Check resting hand splint status, functional mobility - try toilet transfers with ambulation   OT Home Exercise Plan on going   Consulted and Agree with Plan of Care Patient;Family member/caregiver        Problem List Patient Active Problem List   Diagnosis Date Noted  . 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 Hemiparesis ([place]) 02/03/2015  . Spastic hemiplegia affecting nondominant side ([place]) 02/03/2015    Mariah Milling, OTR/L 05/31/2015, 3:25 PM  [facility] [address] [place], [place], [address] Phone: [phone]   Fax:  [phone]  Name: Michelle Kemp MRN: [id number] Date  of Birth: 1941-06-12

## 2015-05-31 NOTE — Patient Instructions (Signed)
Hand Splint    Wearing Schedule: Day Night As needed ___2_ hours per session. __3__ sessions per day.  alll night if tolerated. Check skin condition after _60_ minutes for changes in color, irritation or swelling.  Copyright  VHI. All rights reserved.

## 2015-06-02 ENCOUNTER — Ambulatory Visit: Payer: Self-pay | Admitting: Occupational Therapy

## 2015-06-02 ENCOUNTER — Encounter: Payer: Self-pay | Admitting: Physical Therapy

## 2015-06-02 ENCOUNTER — Encounter: Payer: Self-pay | Admitting: Occupational Therapy

## 2015-06-02 ENCOUNTER — Ambulatory Visit: Payer: Self-pay | Admitting: Physical Therapy

## 2015-06-02 DIAGNOSIS — R269 Unspecified abnormalities of gait and mobility: Secondary | ICD-10-CM

## 2015-06-02 DIAGNOSIS — G8110 Spastic hemiplegia affecting unspecified side: Secondary | ICD-10-CM

## 2015-06-02 DIAGNOSIS — M6281 Muscle weakness (generalized): Secondary | ICD-10-CM

## 2015-06-02 DIAGNOSIS — R293 Abnormal posture: Secondary | ICD-10-CM

## 2015-06-02 DIAGNOSIS — R2681 Unsteadiness on feet: Secondary | ICD-10-CM

## 2015-06-02 DIAGNOSIS — M25612 Stiffness of left shoulder, not elsewhere classified: Secondary | ICD-10-CM

## 2015-06-02 NOTE — Therapy (Signed)
[facility] [address] [place], [place], [address] Phone: [phone]   Fax:  [phone]  Occupational Therapy Treatment  Patient Details  Name: Michelle Kemp MRN: [id number] Date of Birth: [birth year] Referring Provider: Sarina Ill  Encounter Date: 06/02/2015      OT End of Session - 06/02/15 1424    Visit Number 19   Number of Visits 30   Date for OT Re-Evaluation 07/09/15   Authorization Type self pay   OT Start Time 1315   OT Stop Time 1400   OT Time Calculation (min) 45 min   Activity Tolerance Patient tolerated treatment well   Behavior During Therapy [facility] for tasks assessed/performed      Past Medical History  Diagnosis Date  . Stroke ([place])   . Arthritis     Past Surgical History  Procedure Laterality Date  . Hernia repair  2001    There were no vitals filed for this visit.  Visit Diagnosis:  Spastic hemiplegia affecting nondominant side (HCC)  Postural instability  Stiffness of shoulder joint, left  Unsteadiness on feet      Subjective Assessment - 06/02/15 1417    Subjective  Patient indicates hand is less stiff in the morning with the brace on her hand.   Patient is accompained by: Family member   Currently in Pain? No/denies   Pain Score 0-No pain                      OT Treatments/Exercises (OP) - 06/02/15 0001    Neurological Re-education Exercises   Other Exercises 1 Neuromuscular reeducation to address lower body initiated weight shifts on physioball.  Patient currently initiates all transitional movements from head and upper trunk.  Patient resistant to weight shifting toward stronger right side.  On physioball, able to first demonstrate than replicate small weight shifts from lower trunk, lower body in small controlled ranges.  Patient intially nervous,a dn tense, but with gentle guiding, able to shift weight toward right, forward and backward without  difficulty.  Patient received double upright AFO today and assisted patient to don brace and shoes.  Patient continues with difficulty shifting weight off of left leg to step, although once unweighted, patient with excessive swing - even scissoring left leg.  With verbal cueing and facilitation, able to control step length and direction.  Patient requires mod to max asist for directional changes, and turning with platform walker, patient apraxic.  Patient improves stepping patternwith repetition.                 OT Education - 06/02/15 1423    Education provided Yes   Education Details Donning LE brace   Person(s) Educated Patient;Caregiver(s)   Methods Explanation;Demonstration   Comprehension Need further instruction;Verbal cues required          OT Short Term Goals - 03/31/15 1618    OT SHORT TERM GOAL #1   Title Patient will don pull over shirt without assistance (goals due 03/19/15)   Status Not Met   OT SHORT TERM GOAL #2   Title Patient will transition from sit to stand with mod assist as a precursos to eventually allow family to adjust clothing prior to and following toileting   Status Achieved   OT SHORT TERM GOAL #3   Title Patient will report pain no greater than 2-3/10 after positioning in bed by caregivers to allow her to sleep restfully   Status Achieved   OT  SHORT TERM GOAL #4   Title Patient will reach forward toward lower legs and feet and sustaing position for 10 seconds to aide with lower body bathing and dressing.   Status Achieved   OT SHORT TERM GOAL #5   Title Patient will demonstrate active grasp and release  in left hand with minimal facilitation, to aide with meal prep tasks as a precursor to simple cooking task   Status Achieved           OT Long Term Goals - 05/05/15 1710    OT LONG TERM GOAL #1   Title Patient will don lower body clothing with moderate assistance while seated, adn transition to standing to pull up clothing (goals due 10/7)    Status On-going   OT LONG TERM GOAL #2   Title Patient will transfer to commode with moderate assistance for toileting   Status Achieved   OT LONG TERM GOAL #3   Title Patient will stand with no greater than min assist to allow family to complete toilet hygiene    Status Achieved   OT LONG TERM GOAL #4   Title Patient will be able to reposition left arm for comfort in upright and reclined positions - such that pain experienced is no greater than 2/10   Status On-going   OT LONG TERM GOAL #5   Title Patient will prepare simple meal with moderate assistance from family members   Status On-going               Plan - 06/02/15 1424    Clinical Impression Statement Patient shows continued improvement with functional mobility.  Pain in shoulder not present this session.     Pt will benefit from skilled therapeutic intervention in order to improve on the following deficits (Retired) Decreased activity tolerance;Decreased balance;Decreased coordination;Decreased endurance;Impaired flexibility;Impaired perceived functional ability;Decreased safety awareness;Decreased range of motion;Decreased mobility;Decreased knowledge of use of DME;Decreased knowledge of precautions;Decreased strength;Increased edema;Increased muscle spasms;Impaired sensation;Pain;Impaired tone;Impaired UE functional use;Impaired vision/preception   Rehab Potential Good   Clinical Impairments Affecting Rehab Potential limited financial resources   OT Frequency 2x / week   OT Duration 8 weeks   OT Treatment/Interventions Self-care/ADL training;Electrical Stimulation;Cryotherapy;Moist Heat;Visual/perceptual remediation/compensation;Patient/family education;Balance training;Splinting;Therapeutic exercises;Therapeutic activities;Neuromuscular education;Functional Mobility Training;Fluidtherapy;Biofeedback;DME and/or AE instruction   Plan Functional mobility, - try bathroom transfers (with walker)  ADL, NMR LUE   OT Home Exercise  Plan on going   Consulted and Agree with Plan of Care Patient;Family member/caregiver        Problem List Patient Active Problem List   Diagnosis Date Noted  . 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 Hemiparesis ([place]) 02/03/2015  . Spastic hemiplegia affecting nondominant side ([place]) 02/03/2015    Mariah Milling, OTR/L 06/02/2015, 2:26 PM  [facility] [address] [place], [place], [address] Phone: [phone]   Fax:  [phone]  Name: Michelle Kemp MRN: [id number] Date of Birth: [birth year]

## 2015-06-02 NOTE — Therapy (Signed)
[facility] [address] [place], [place], [address] Phone: [phone]   Fax:  [phone]  Physical Therapy Treatment  Patient Details  Name: Michelle Kemp MRN: [id number] Date of Birth: 1940-10-19 No Data Recorded  Encounter Date: 06/02/2015      PT End of Session - 06/02/15 1449    Visit Number 23   Number of Visits 32   Date for PT Re-Evaluation 06/08/15   Authorization Type Self pay, no eligibility to apply for benefits per daughter's reports   PT Start Time 1400   PT Stop Time 1445   PT Time Calculation (min) 45 min   Equipment Utilized During Treatment Gait belt   Behavior During Therapy [facility] for tasks assessed/performed      Past Medical History  Diagnosis Date  . Stroke ([place])   . Arthritis     Past Surgical History  Procedure Laterality Date  . Hernia repair  2001    There were no vitals filed for this visit.  Visit Diagnosis:  Spastic hemiplegia affecting nondominant side (HCC)  Unsteadiness on feet  Postural instability  Abnormality of gait  Muscle weakness      Subjective Assessment - 06/02/15 1432    Subjective Pt reports that the brace is helping her to walk easier. Pt reports no other changes in status   Currently in Pain? No/denies   Pain Score 0-No pain   Pain Location Leg   Pain Orientation --   Pain Descriptors / Indicators --   Pain Type --   Pain Onset --   Pain Frequency Intermittent   Aggravating Factors  --   Pain Relieving Factors --   Multiple Pain Sites No     Transfers:  Pt completed 4 sit > <stands using platform RW and chair with armrests. Pt required min-mod assist +2 with verbal and tactile cues for hand placement and pushing with UE's and LE's.  Pt requires verbal cues to not sit down before the LE's are in contact with chair during stand/ sit.  Gait Training: To increase pt's functional independence with gait through multimodal cues for posture, weight  shifting for increased ease in advancing lower extremities through facilitation at pelvis:   1. Pt ambulated 15 ft X1, 22 ft X1, and 26 ft X1 on indoor, level surfaces. Pt required min-mod assist +2 with tactile cues for walker advancement, LE advancement, weight shifting and assist for left LE step placement due to spacticity. with fatigue pt needs assist to advance LE..    NMR: To encourage weight shift on the L affected side and to encourage weight bearing without UE support.  1. In standing pt completed reaching to cones on table on R and giving them to family member on L X6 and then the 6 cones were returned from L to R. Pt min-mod assist +2 for standing erect and for hip and knee extension       PT Short Term Goals - 06/02/15 1456    PT SHORT TERM GOAL #1   Title The patient will tolerate standing program with family assistance x 5 minutes for improved weight bearing and assist for ADLs. Target date 05/09/2015   Baseline 11/2: Achieved, per pt report.   Status Achieved   PT SHORT TERM GOAL #2   Title The patient will move sit<>supine with CGA. Target date 05/09/2015   Baseline 11/2: pt required min to mod A and 50% cueing.   Status Not Met   PT SHORT  TERM GOAL #3   Title The patient will transfer w/c<>mat with sliding board with CGA of 1 person. Target date 05/09/2015   Baseline Deferred, as pt currently performing standing pivot transfers with family.   Status Deferred   PT SHORT TERM GOAL #4   Title The patient will propel manual wheelchair with minimal verbal cues.   Baseline Target date 05/09/2015   Status Not Met   PT SHORT TERM GOAL #5   Title The patient will ambulate in parallel bars with +2 assist x 5 feet to demo improved postural control and strength for standing tasks. Target date 05/09/2015   Baseline 11/2: Pt has consistently ambulated with min to max A using L PFRW.   Status Achieved           PT Long Term Goals - 06/02/15 1456    PT LONG TERM GOAL #1    Title The patient/family will verbalize understanding of CVA warning signs/risk factors.   Baseline Target date 06/08/2015   Time 8   Period Weeks   Status On-going   PT LONG TERM GOAL #2   Title The patient will move sit<>stand with CGA of 1 person with UE support.   Baseline Target date 06/08/2015   Time 8   Period Weeks   Status New   PT LONG TERM GOAL #3   Title The patient will transfer w/c<>mat with squat pivot or boost transfer with CGA for safety.   Baseline Target date 06/08/2015   Time 8   Period Weeks   Status New   PT LONG TERM GOAL #4   Title The patient will ambulate x 10 feet with mod A of 1 person with least restrictive assistive device.   Baseline Target date 06/08/2015   Time 8   Period Weeks   Status New   PT LONG TERM GOAL #5   Title The patient and family will demo indep with HEP for post d/c plan.   Baseline Target date 06/08/2015   Time 8   Period Weeks   Status New               Plan - 06/02/15 1450    Clinical Impression Statement Pt progressing towards LTG's. Pt now has L AFO and it has improved her gait significantly. Pt presents with decreased stance width on R, decreased hip and knee ext. Pt responds to tactile cues at hips, legs and feet. Pt able to ambulate for 3, 15 to 25 ft intervals with 3-4 min sitting rest break  between intervals. Pt could benefit from further training with stand/sit transfer due to premature descent.   PT Next Visit Plan Assess LTG's. Continue to work on reaching in sitting and standing. Attempt reaching in sitting on physioball (collaberate with OT). Continue working on gait to improve technique and increase activity tolerance.   Consulted and Agree with Plan of Care Patient;Family member/caregiver   Family Member Consulted family friend        Problem List Patient Active Problem List   Diagnosis Date Noted  . 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 Hemiparesis ([place]) 02/03/2015  . Spastic hemiplegia  affecting nondominant side ([place]) 02/03/2015    Laney Potash 06/02/2015, 3:28 PM  [place], [place]  Name: Michelle Kemp MRN: [id number] Date of Birth: 1940-10-03  This note has been reviewed and edited by supervising CI.  Willow Ora, PTA, [facility] [address] [place], [place] [address] [phone] 06/07/2015, 12:00 AM

## 2015-06-07 ENCOUNTER — Encounter: Payer: Self-pay | Admitting: Physical Therapy

## 2015-06-07 ENCOUNTER — Ambulatory Visit: Payer: Self-pay | Admitting: Occupational Therapy

## 2015-06-07 ENCOUNTER — Ambulatory Visit: Payer: Self-pay | Admitting: Physical Therapy

## 2015-06-07 ENCOUNTER — Encounter: Payer: Self-pay | Admitting: Occupational Therapy

## 2015-06-07 DIAGNOSIS — R2681 Unsteadiness on feet: Secondary | ICD-10-CM

## 2015-06-07 DIAGNOSIS — G8110 Spastic hemiplegia affecting unspecified side: Secondary | ICD-10-CM

## 2015-06-07 DIAGNOSIS — M6281 Muscle weakness (generalized): Secondary | ICD-10-CM

## 2015-06-07 DIAGNOSIS — R293 Abnormal posture: Secondary | ICD-10-CM

## 2015-06-07 DIAGNOSIS — R269 Unspecified abnormalities of gait and mobility: Secondary | ICD-10-CM

## 2015-06-07 NOTE — Therapy (Addendum)
[facility] [address] [place], [place], [address] Phone: [phone]   Fax:  [phone]  Physical Therapy Treatment  Patient Details  Name: Michelle Kemp MRN: [id number] Date of Birth: May 24, 1941 No Data Recorded  Encounter Date: 06/07/2015      PT End of Session - 06/07/15 1458    Visit Number 24   Number of Visits 32   Date for PT Re-Evaluation 06/08/15   Authorization Type Self pay, no eligibility to apply for benefits per daughter's reports   PT Start Time 1405   PT Stop Time 1445   PT Time Calculation (min) 40 min   Equipment Utilized During Treatment Gait belt   Behavior During Therapy [facility] for tasks assessed/performed      Past Medical History  Diagnosis Date  . Stroke ([place])   . Arthritis     Past Surgical History  Procedure Laterality Date  . Hernia repair  2001    There were no vitals filed for this visit.  Visit Diagnosis:  Spastic hemiplegia affecting nondominant side (HCC)  Unsteadiness on feet  Postural instability  Abnormality of gait  Muscle weakness      Subjective Assessment - 06/07/15 1455    Subjective Pt reports no new changes in status. Pt reports 4/10 pain in anterior and posterior L Knee.    Patient is accompained by: Family member   Currently in Pain? Yes   Pain Score 4    Pain Location Knee   Pain Orientation Left   Pain Descriptors / Indicators Aching   Pain Type Chronic pain   Pain Onset More than a month ago   Pain Frequency Intermittent   Aggravating Factors  arthritis, L LE stiffness   Effect of Pain on Daily Activities rest   Multiple Pain Sites No       NMR: Multimodal cueing utilized for posture, weight shifting and general technique with transfers/gait.  Min to mod assist for sit<>stand transfer's. 2 person min to mod assist for safety with gait/walker management.   1. Sit <> stand: 3 reps, WC <> RW with min assist and verbal cues for pushing  with UE/LE's. 1 rep occurred to RW with platform and 2 occurred to and from RW without platform.   2. Pt completed 3 reps of ambulation: 46f, 124f and 1874fPt min to mod assist with all three reps of 2 people for safety. Pt required intermittent mod assist to advance L LE and to advance and steer RW correctly.It was reported by pt's nephew that her daughter JulAlmyra Frees been walking her at home with a walker without the platform, and he proceeded to demo how she holds the walker at home. KriAntony SalmonT was notified and joined in for 2cd walk to assess pt's arm in this position for safety and function.  It was noted that pt is safe to in using RW without platform for L UE support. Pt requires many verbal, visual and tactile cues for weight shifting onto R LE, advancing RW and L LE and to steer RW correctly.   Self care: Pt's nephew was unable to recall/state the warning signs for CVA. Verbal reviewed all of these with both him and the pt. Written information was provided.       PT Short Term Goals - 06/07/15 1506    PT SHORT TERM GOAL #1   Title The patient will tolerate standing program with family assistance x 5 minutes for improved weight bearing and assist  for ADLs. Target date 05/09/2015   Baseline 11/2: Achieved, per pt report.   Status Achieved   PT SHORT TERM GOAL #2   Title The patient will move sit<>supine with CGA. Target date 05/09/2015   Baseline 11/2: pt required min to mod A and 50% cueing.   Status Not Met   PT SHORT TERM GOAL #3   Title The patient will transfer w/c<>mat with sliding board with CGA of 1 person. Target date 05/09/2015   Baseline Deferred, as pt currently performing standing pivot transfers with family.   Status Deferred   PT SHORT TERM GOAL #4   Title The patient will propel manual wheelchair with minimal verbal cues.   Baseline Target date 05/09/2015   Status Not Met   PT SHORT TERM GOAL #5   Title The patient will ambulate in parallel bars with +2  assist x 5 feet to demo improved postural control and strength for standing tasks. Target date 05/09/2015   Baseline 11/2: Pt has consistently ambulated with min to max A using L PFRW.   Status Achieved            PT Long Term Goals - 06/07/15 1445    PT LONG TERM GOAL #1   Title The patient/family will verbalize understanding of CVA warning signs/risk factors.   Baseline Achieved: 06/07/15. Pt and nephew only able to verbalize signs. Informatoin on risk factors and warning signs reveiwed with them. Written copies provided.   Time 8   Period Weeks   Status Achieved   PT LONG TERM GOAL #2   Title The patient will move sit<>stand with CGA of 1 person with UE support.   Baseline Assessed: 06/07/15. Pt min assist +1 with sit<>stand with RW with platform. Pt also min assist + one with RW without platform.   Time 8   Period Weeks   Status On-going   PT LONG TERM GOAL #3   Title The patient will transfer w/c<>mat with squat pivot or boost transfer with CGA for safety.   Baseline Goal was deffered because pt is currently doing standing pivot transfer to enter car.   Time 8   Period Weeks   Status Deferred   PT LONG TERM GOAL #4   Title The patient will ambulate x 10 feet with mod A of 1 person with least restrictive assistive device.   Baseline Achieved: 06/07/15. Pt min assist +1 due to difficulty steering RW with L UE. second person for safetey due to multiple needs with gait (for gait deviations and walker management, especially with turing to sit down).   Time 8   Period Weeks   Status Achieved   PT LONG TERM GOAL #5   Title The patient and family will demo indep with HEP for post d/c plan.   Baseline Target date 06/08/2015. .   Time 8   Period Weeks   Status On-going           Plan - 06/07/15 1459    Clinical Impression Statement Pt has met LTG 4. Pt continues to progress towards other LTG's. Pt presented with increased extensor tone on L affected side today, which hindered  pt's performance. Pt and son verbalized CVA signs and were unable to verbalize risk factors. Pt was given hand out on signs and risk factors and they will be reviewed next session. Pt requires verbal, tactile and visual cues during gait for weight shift and L LE advancement and walker control.    PT Next  Visit Plan Continue working towards remaining goals. Review CVA signs and risk Factors. Talk to PT about renewing pt POC.   Consulted and Agree with Plan of Care Patient;Family member/caregiver   Family Member Consulted family friend         Problem List Patient Active Problem List   Diagnosis Date Noted  . 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 Hemiparesis ([place]) 02/03/2015  . Spastic hemiplegia affecting nondominant side ([place]) 02/03/2015    Laney Potash 06/08/2015, 2:20 PM  [place], [place]  Name: Michelle Kemp MRN: [id number] Date of Birth: [birth year]  This note has been reviewed and edited by supervising CI.  This entire session was performed under direct supervision and direction of a licensed therapist/therapist assistant . I have personally read, edited and approve of the note as written.  Willow Ora, PTA, [facility] [address], [place], [place] [address] [phone] 06/08/2015, 10:27 PM

## 2015-06-07 NOTE — Patient Instructions (Signed)
Stroke Prevention °Some health problems and behaviors may make it more likely for you to have a stroke. Below are ways to lessen your risk of having a stroke.  °· Be active for at least 30 minutes on most or all days. °· Do not smoke. Try not to be around others who smoke. °· Do not drink too much alcohol. °¨ Do not have more than 2 drinks a day if you are a man. °¨ Do not have more than 1 drink a day if you are a woman and are not pregnant. °· Eat healthy foods, such as fruits and vegetables. If you were put on a specific diet, follow the diet as told. °· Keep your cholesterol levels under control through diet and medicines. Look for foods that are low in saturated fat, trans fat, cholesterol, and are high in fiber. °· If you have diabetes, follow all diet plans and take your medicine as told. °· Ask your doctor if you need treatment to lower your blood pressure. If you have high blood pressure (hypertension), follow all diet plans and take your medicine as told by your doctor. °· If you are 18-39 years old, have your blood pressure checked every 3-5 years. If you are age 40 or older, have your blood pressure checked every year. °· Keep a healthy weight. Eat foods that are low in calories, salt, saturated fat, trans fat, and cholesterol. °· Do not take drugs. °· Avoid birth control pills, if this applies. Talk to your doctor about the risks of taking birth control pills. °· Talk to your doctor if you have sleep problems (sleep apnea). °· Take all medicine as told by your doctor. °¨ You may be told to take aspirin or blood thinner medicine. Take this medicine as told by your doctor. °¨ Understand your medicine instructions. °· Make sure any other conditions you have are being taken care of. °GET HELP RIGHT AWAY IF: °· You suddenly lose feeling (you feel numb) or have weakness in your face, arm, or leg. °· Your face or eyelid hangs down to one side. °· You suddenly feel confused. °· You have trouble talking (aphasia)  or understanding what people are saying. °· You suddenly have trouble seeing in one or both eyes. °· You suddenly have trouble walking. °· You are dizzy. °· You lose your balance or your movements are clumsy (uncoordinated). °· You suddenly have a very bad headache and you do not know the cause. °· You have new chest pain. °· Your heart feels like it is fluttering or skipping a beat (irregular heartbeat). °Do not wait to see if the symptoms above go away. Get help right away. Call your local emergency services (911 in [place] Do not drive yourself to the hospital. °  °This information is not intended to replace advice given to you by your health care provider. Make sure you discuss any questions you have with your health care provider. °  °Document Released: 12/26/2011 Document Revised: 07/17/2014 Document Reviewed: 12/27/2012 °Elsevier Interactive Patient Education ©2016 [employer] ° °

## 2015-06-07 NOTE — Therapy (Signed)
[facility] [address] [place], [place], [address] Phone: [phone]   Fax:  [phone]  Occupational Therapy Treatment  Patient Details  Name: Trica Usery MRN: [id number] Date of Birth: 1941/03/20 Referring Provider: Sarina Ill  Encounter Date: 06/07/2015      OT End of Session - 06/07/15 1502    Visit Number 20   Number of Visits 30   Date for OT Re-Evaluation 07/09/15   Authorization Type self pay   OT Start Time 1317   OT Stop Time 1400   OT Time Calculation (min) 43 min   Activity Tolerance Patient tolerated treatment well   Behavior During Therapy [facility] for tasks assessed/performed      Past Medical History  Diagnosis Date  . Stroke ([place])   . Arthritis     Past Surgical History  Procedure Laterality Date  . Hernia repair  2001    There were no vitals filed for this visit.  Visit Diagnosis:  Unsteadiness on feet  Spastic hemiplegia affecting nondominant side (HCC)  Postural instability      Subjective Assessment - 06/07/15 1453    Subjective  Patient indicates shoulde ris not ainful but at times elbow is bothering her today.   Patient is accompained by: Family member   Currently in Pain? Yes   Pain Score 2    Pain Location Elbow   Pain Orientation Left   Pain Descriptors / Indicators Aching   Pain Type Chronic pain   Pain Onset More than a month ago   Pain Frequency Intermittent                      OT Treatments/Exercises (OP) - 06/07/15 0001    ADLs   UB Dressing Practiced dressing - donning and doffing overhead shirt using backward chaining.  Added addditional step with each repetition.  Patient able to don pull over shirt with mod assistance after skilled practice.     LB Dressing Able to sit unsupported and reach to floor to doff shoes with min assist.  Able to do right shoe without assistance, able to aide with donning left shoe with mod assist - total  assitance needed to tie shoes, although patient able to pinch shoelace, with left thumb (lateral pinch)   In standing, patient able to pull up and pull down elastic waist pants with intermittent min assistance for balance.  Significant improvement in standing balance static to dynamic noted today.   Toileting Practiced ambulating in bathroom for commode transfers.  Patient able to walk with platform walker and mod assist to commode, needs increased time and cueing for directional changes; turning, backing up, yet able to safely follow gestures to complete.  Shared results with family member.                  OT Education - 06/07/15 1501    Education provided Yes   Education Details Donning brace technique, bathroom transfers with walker   Person(s) Educated Patient   Methods Demonstration;Tactile cues   Comprehension Need further instruction;Returned demonstration          OT Short Term Goals - 03/31/15 1618    OT SHORT TERM GOAL #1   Title Patient will don pull over shirt without assistance (goals due 03/19/15)   Status Not Met   OT SHORT TERM GOAL #2   Title Patient will transition from sit to stand with mod assist as a precursos to eventually allow family  to adjust clothing prior to and following toileting   Status Achieved   OT SHORT TERM GOAL #3   Title Patient will report pain no greater than 2-3/10 after positioning in bed by caregivers to allow her to sleep restfully   Status Achieved   OT SHORT TERM GOAL #4   Title Patient will reach forward toward lower legs and feet and sustaing position for 10 seconds to aide with lower body bathing and dressing.   Status Achieved   OT SHORT TERM GOAL #5   Title Patient will demonstrate active grasp and release  in left hand with minimal facilitation, to aide with meal prep tasks as a precursor to simple cooking task   Status Achieved           OT Long Term Goals - 06/07/15 1503    OT LONG TERM GOAL #1   Title Patient will  don lower body clothing with moderate assistance while seated, adn transition to standing to pull up clothing (goals due 10/7)   Status On-going   OT LONG TERM GOAL #2   Title Patient will transfer to commode with moderate assistance for toileting   Status Achieved   OT LONG TERM GOAL #3   Title Patient will stand with no greater than min assist to allow family to complete toilet hygiene    Status Achieved   OT LONG TERM GOAL #4   Title Patient will be able to reposition left arm for comfort in upright and reclined positions - such that pain experienced is no greater than 2/10   Status On-going               Plan - 06/07/15 1502    Clinical Impression Statement Patient continues to improve with functional mobility and family reports significant improvement at home as well.   Pt will benefit from skilled therapeutic intervention in order to improve on the following deficits (Retired) Decreased activity tolerance;Decreased balance;Decreased coordination;Decreased endurance;Impaired flexibility;Impaired perceived functional ability;Decreased safety awareness;Decreased range of motion;Decreased mobility;Decreased knowledge of use of DME;Decreased knowledge of precautions;Decreased strength;Increased edema;Increased muscle spasms;Impaired sensation;Pain;Impaired tone;Impaired UE functional use;Impaired vision/preception   Rehab Potential Good   Clinical Impairments Affecting Rehab Potential limited financial resources   OT Frequency 2x / week   OT Duration 8 weeks   OT Treatment/Interventions Self-care/ADL training;Electrical Stimulation;Cryotherapy;Moist Heat;Visual/perceptual remediation/compensation;Patient/family education;Balance training;Splinting;Therapeutic exercises;Therapeutic activities;Neuromuscular education;Functional Mobility Training;Fluidtherapy;Biofeedback;DME and/or AE instruction   Plan ADL, NMR RUE, Funnctional mobility   OT Home Exercise Plan on going   Consulted and  Agree with Plan of Care Patient;Family member/caregiver        Problem List Patient Active Problem List   Diagnosis Date Noted  . 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 Hemiparesis ([place]) 02/03/2015  . Spastic hemiplegia affecting nondominant side ([place]) 02/03/2015    Mariah Milling, OTR/L 06/07/2015, 3:05 PM  [facility] [address] [place], [place], [address] Phone: [phone]   Fax:  [phone]  Name: Pantera Winterrowd MRN: [id number] Date of Birth: 01-Jun-1941

## 2015-06-09 ENCOUNTER — Ambulatory Visit: Payer: Self-pay | Admitting: Occupational Therapy

## 2015-06-09 ENCOUNTER — Encounter: Payer: Self-pay | Admitting: Physical Therapy

## 2015-06-09 ENCOUNTER — Encounter: Payer: Self-pay | Admitting: Occupational Therapy

## 2015-06-09 ENCOUNTER — Ambulatory Visit: Payer: Self-pay | Admitting: Physical Therapy

## 2015-06-09 VITALS — BP 140/109

## 2015-06-09 DIAGNOSIS — M25542 Pain in joints of left hand: Secondary | ICD-10-CM

## 2015-06-09 DIAGNOSIS — R293 Abnormal posture: Secondary | ICD-10-CM

## 2015-06-09 DIAGNOSIS — M25612 Stiffness of left shoulder, not elsewhere classified: Secondary | ICD-10-CM

## 2015-06-09 DIAGNOSIS — R2681 Unsteadiness on feet: Secondary | ICD-10-CM

## 2015-06-09 DIAGNOSIS — R269 Unspecified abnormalities of gait and mobility: Secondary | ICD-10-CM

## 2015-06-09 DIAGNOSIS — G8110 Spastic hemiplegia affecting unspecified side: Secondary | ICD-10-CM

## 2015-06-09 DIAGNOSIS — M6281 Muscle weakness (generalized): Secondary | ICD-10-CM

## 2015-06-09 DIAGNOSIS — M25512 Pain in left shoulder: Secondary | ICD-10-CM

## 2015-06-09 NOTE — Therapy (Signed)
[facility] [address] [place], [place], [address] Phone: [phone]   Fax:  [phone]  Occupational Therapy Treatment  Patient Details  Name: Michelle Kemp MRN: [id number] Date of Birth: [birth year] Referring Provider: Sarina Ill  Encounter Date: 06/09/2015      OT End of Session - 06/09/15 1523    Visit Number 21   Number of Visits 20   Date for OT Re-Evaluation 07/09/15   Authorization Type self pay   OT Start Time 1315   OT Stop Time 1400   OT Time Calculation (min) 45 min   Activity Tolerance Patient tolerated treatment well   Behavior During Therapy [facility] for tasks assessed/performed      Past Medical History  Diagnosis Date  . Stroke ([place])   . Arthritis     Past Surgical History  Procedure Laterality Date  . Hernia repair  2001    There were no vitals filed for this visit.  Visit Diagnosis:  Unsteadiness on feet  Postural instability  Stiffness of shoulder joint, left  Spastic hemiplegia affecting nondominant side (HCC)      Subjective Assessment - 06/09/15 1508    Subjective  Patient indicates that she is able to stand for longer periods at home   Patient is accompained by: Family member   Pain Score 0-No pain                      OT Treatments/Exercises (OP) - 06/09/15 0001    Neurological Re-education Exercises   Other Exercises 1 Neuromuscular reeducation to address static to dynamic stand balance as needed for ADL.  Patient overly reliant on RUE for pulling to stand and stand balance, and then is limited in her ability to use this arm for function.  Practiced sit to stand with emphasis on midline orientation to place emphasis on bilateral lower extremities.  Patient able to repeat sit to/from stand with only intermittent assistance to maintain forward weight shift throughout transition, and to maintain midline orientation.  Once standing worked to improve stand  balance.  Patient with strong posterior preference, yet with facilitation and intermittent assistance able to find and sustain balanced midline.  In sitting worked to accept weight onto left upper extremity and utilize elbow extension to transition from side sit to sit.                OT Education - 06/09/15 1523    Education provided Yes   Education Details stand balance techniques   Person(s) Educated Patient;Caregiver(s)   Methods Explanation;Demonstration;Tactile cues;Verbal cues   Comprehension Returned demonstration;Tactile cues required;Need further instruction          OT Short Term Goals - 03/31/15 1618    OT SHORT TERM GOAL #1   Title Patient will don pull over shirt without assistance (goals due 03/19/15)   Status Not Met   OT SHORT TERM GOAL #2   Title Patient will transition from sit to stand with mod assist as a precursos to eventually allow family to adjust clothing prior to and following toileting   Status Achieved   OT SHORT TERM GOAL #3   Title Patient will report pain no greater than 2-3/10 after positioning in bed by caregivers to allow her to sleep restfully   Status Achieved   OT SHORT TERM GOAL #4   Title Patient will reach forward toward lower legs and feet and sustaing position for 10 seconds to aide with lower body bathing  and dressing.   Status Achieved   OT SHORT TERM GOAL #5   Title Patient will demonstrate active grasp and release  in left hand with minimal facilitation, to aide with meal prep tasks as a precursor to simple cooking task   Status Achieved           OT Long Term Goals - 06/07/15 1503    OT LONG TERM GOAL #1   Title Patient will don lower body clothing with moderate assistance while seated, adn transition to standing to pull up clothing (goals due 10/7)   Status On-going   OT LONG TERM GOAL #2   Title Patient will transfer to commode with moderate assistance for toileting   Status Achieved   OT LONG TERM GOAL #3   Title  Patient will stand with no greater than min assist to allow family to complete toilet hygiene    Status Achieved   OT LONG TERM GOAL #4   Title Patient will be able to reposition left arm for comfort in upright and reclined positions - such that pain experienced is no greater than 2/10   Status On-going               Plan - 06/09/15 1524    Clinical Impression Statement Patient showing steady improvement with UE positioning, decreased pain in left shoulder, and functional mobility - which is improving her ability to participate in basic ADL.    Pt will benefit from skilled therapeutic intervention in order to improve on the following deficits (Retired) Decreased activity tolerance;Decreased balance;Decreased coordination;Decreased endurance;Impaired flexibility;Impaired perceived functional ability;Decreased safety awareness;Decreased range of motion;Decreased mobility;Decreased knowledge of use of DME;Decreased knowledge of precautions;Decreased strength;Increased edema;Increased muscle spasms;Impaired sensation;Pain;Impaired tone;Impaired UE functional use;Impaired vision/preception   Rehab Potential Good   Clinical Impairments Affecting Rehab Potential limited financial resources   OT Frequency 2x / week   OT Duration 8 weeks   OT Treatment/Interventions Self-care/ADL training;Electrical Stimulation;Cryotherapy;Moist Heat;Visual/perceptual remediation/compensation;Patient/family education;Balance training;Splinting;Therapeutic exercises;Therapeutic activities;Neuromuscular education;Functional Mobility Training;Fluidtherapy;Biofeedback;DME and/or AE instruction   Plan adl, NMR LUE, Functional mobility   OT Home Exercise Plan on going   Consulted and Agree with Plan of Care Patient;Family member/caregiver        Problem List Patient Active Problem List   Diagnosis Date Noted  . Hemorrhage, intracerebral (HCC) 02/03/2015  . Hemiparesis (HCC) 02/03/2015  . Spastic hemiplegia  affecting nondominant side (HCC) 02/03/2015    Gellert, Kristin M, OTR/L 06/09/2015, 3:26 PM  [name] [address] [place], [place], [address] Phone: [phone]   Fax:  [phone]  Name: Michelle Kemp MRN: [id number] Date of Birth: 10/22/1940   

## 2015-06-09 NOTE — Therapy (Signed)
[facility] [address] [place], [place], [address] Phone: [phone]   Fax:  [phone]  Physical Therapy Treatment  Patient Details  Name: Michelle Kemp MRN: [id number] Date of Birth: 1940/08/08 No Data Recorded  Encounter Date: 06/09/2015      PT End of Session - 06/09/15 1446    Visit Number 25   Number of Visits 32   PT Start Time 1400   PT Stop Time 1424   PT Time Calculation (min) 24 min   Activity Tolerance Other (comment)  pt. limited in session by high blood pressure.    Behavior During Therapy [facility] for tasks assessed/performed      Past Medical History  Diagnosis Date  . Stroke ([place])   . Arthritis     Past Surgical History  Procedure Laterality Date  . Hernia repair  2001    Filed Vitals:   06/09/15 1435  BP: 140/109    Visit Diagnosis:  Unsteadiness on feet  Spastic hemiplegia affecting nondominant side (HCC)  Postural instability  Abnormality of gait  Muscle weakness  Stiffness of shoulder joint, left  Joint pain in fingers of left hand  Pain in joint, shoulder region, left      Subjective Assessment - 06/09/15 1437    Subjective Pt. reported having a severe headache yesterday and today.  Headache pain today reported as a 8/10.     Patient is accompained by: Family member;Interpreter   Patient Stated Goals Improve mobility and endurance   Currently in Pain? Yes   Pain Score 8    Pain Location Head   Pain Orientation Left   Pain Descriptors / Indicators Aching   Pain Type Chronic pain   Pain Radiating Towards Not stated.    Pain Onset Yesterday   Pain Frequency Constant   Aggravating Factors  not stated.    Pain Relieving Factors not stated.    Effect of Pain on Daily Activities rest.    Multiple Pain Sites No     Pt. current functional activity level provided by nephew: unable to fully assess in session due to elevated BP - pt. requires maximum assistance from  family donning and doffing AFO.   - no concerns with AFO from family - pt. requires minimum assistance for standing pivot transfers without RW; RW is not used during standing pivot transfer's at home. Pt holding onto their arm and supported under her hemiplegic arm per report. - pt. Is walking at home twice a day 5-8 min at a time requiring maximum assistance from family.  - pt standing up to 6-7 minutes at one time, multiple reps through out the day with min assist to supervision from family, with single UE support.        [facility] Adult PT Treatment/Exercise - 06/09/15 1440    Transfers   Transfers Stand Pivot Transfers   Stand Pivot Transfers 4: Min assist  Mod assist for safe descent from mat table to w/c   Stand Pivot Transfer Details (indicate cue type and reason) Pt. requiring min assist with standing during standing pivot from mat table to w/c; max verbal cues provided for standin with tactile cuing for LE activation and forward wt. shift; pt. requiring mod assist for safe descent and increased time for transfer.                PT Short Term Goals - 06/07/15 1506    PT SHORT TERM GOAL #1   Title The patient will  tolerate standing program with family assistance x 5 minutes for improved weight bearing and assist for ADLs. Target date 05/09/2015   Baseline 11/2: Achieved, per pt report.   Status Achieved   PT SHORT TERM GOAL #2   Title The patient will move sit<>supine with CGA. Target date 05/09/2015   Baseline 11/2: pt required min to mod A and 50% cueing.   Status Not Met   PT SHORT TERM GOAL #3   Title The patient will transfer w/c<>mat with sliding board with CGA of 1 person. Target date 05/09/2015   Baseline Deferred, as pt currently performing standing pivot transfers with family.   Status Deferred   PT SHORT TERM GOAL #4   Title The patient will propel manual wheelchair with minimal verbal cues.   Baseline Target date 05/09/2015   Status Not Met   PT SHORT TERM GOAL  #5   Title The patient will ambulate in parallel bars with +2 assist x 5 feet to demo improved postural control and strength for standing tasks. Target date 05/09/2015   Baseline 11/2: Pt has consistently ambulated with min to max A using L PFRW.   Status Achieved           PT Long Term Goals - 06/07/15 1445    PT LONG TERM GOAL #1   Title The patient/family will verbalize understanding of CVA warning signs/risk factors.   Baseline Achieved: 06/07/15. Pt and nephew only able to verbalize signs. Informatoin on risk factors and warning signs reveiwed with them. Written copies provided.   Time 8   Period Weeks   Status Achieved   PT LONG TERM GOAL #2   Title The patient will move sit<>stand with CGA of 1 person with UE support.   Baseline Assessed: 06/07/15. Pt min assist +1 with sit<>stand with RW with platform. Pt also min assist + one with RW without platform.   Time 8   Period Weeks   Status On-going   PT LONG TERM GOAL #3   Title The patient will transfer w/c<>mat with squat pivot or boost transfer with CGA for safety.   Baseline Goal was deffered because pt is currently doing standing pivot transfer to enter car.   Time 8   Period Weeks   Status Deferred   PT LONG TERM GOAL #4   Title The patient will ambulate x 10 feet with mod A of 1 person with least restrictive assistive device.   Baseline Achieved: 06/07/15. Pt min assist +1 due to difficulty steering RW with L UE. second person for safetey due to multiple needs with gait (for gait deviations and walker management, especially with turing to sit down).   Time 8   Period Weeks   Status Achieved   PT LONG TERM GOAL #5   Title The patient and family will demo indep with HEP for post d/c plan.   Baseline Target date 06/08/2015. .   Time 8   Period Weeks   Status On-going           Plan - 06/09/15 1448    Clinical Impression Statement Session limited by pt. high blood pressure taken at 140/109 mmHg with machine, and  138/104 mmHg taken manually.  Did not perform exercise.  Pt. reported having 7/10 pain in head yesterday and a 8/10 pain in head during session.Call placed to pt's daughter regarding elevated BP with symptoms. Daughter to call pt's primary MD about this. Current activity level at home provided by family member (see  treatment free-text).  Additional activity performed in session (see treatment section) was solely for getting back into wheelchair.    PT Next Visit Plan Continue working towards remaining goals.  PT to renew pt POC.  Monitor BP.     Consulted and Agree with Plan of Care Patient;Family member/caregiver   Family Member Consulted nephew        Problem List Patient Active Problem List   Diagnosis Date Noted  . 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 Hemiparesis ([place]) 02/03/2015  . Spastic hemiplegia affecting nondominant side ([place]) 02/03/2015    Bess Harvest 06/09/2015, 2:57 PM  [place], [place]  Name: Stacy Sailer MRN: [id number] Date of Birth: 1941-03-16  This note has been reviewed and edited by supervising CI.  Willow Ora, PTA, [facility] [address] [place], [place] [address] [phone] 06/10/2015, 4:31 PM

## 2015-06-14 ENCOUNTER — Ambulatory Visit: Payer: Self-pay | Admitting: Physical Therapy

## 2015-06-14 ENCOUNTER — Encounter: Payer: Self-pay | Admitting: Occupational Therapy

## 2015-06-14 ENCOUNTER — Ambulatory Visit: Payer: Self-pay | Attending: Neurology | Admitting: Occupational Therapy

## 2015-06-14 ENCOUNTER — Encounter: Payer: Self-pay | Admitting: Physical Therapy

## 2015-06-14 VITALS — BP 142/90 | HR 101

## 2015-06-14 VITALS — BP 140/102

## 2015-06-14 DIAGNOSIS — M6281 Muscle weakness (generalized): Secondary | ICD-10-CM | POA: Insufficient documentation

## 2015-06-14 DIAGNOSIS — M25612 Stiffness of left shoulder, not elsewhere classified: Secondary | ICD-10-CM

## 2015-06-14 DIAGNOSIS — G8110 Spastic hemiplegia affecting unspecified side: Secondary | ICD-10-CM | POA: Insufficient documentation

## 2015-06-14 DIAGNOSIS — R2681 Unsteadiness on feet: Secondary | ICD-10-CM | POA: Insufficient documentation

## 2015-06-14 DIAGNOSIS — R269 Unspecified abnormalities of gait and mobility: Secondary | ICD-10-CM

## 2015-06-14 DIAGNOSIS — R293 Abnormal posture: Secondary | ICD-10-CM | POA: Insufficient documentation

## 2015-06-14 DIAGNOSIS — M25512 Pain in left shoulder: Secondary | ICD-10-CM | POA: Insufficient documentation

## 2015-06-14 NOTE — Therapy (Signed)
[facility] [address] [place], [place], [address] Phone: [phone]   Fax:  [phone]  Occupational Therapy Treatment  Patient Details  Name: Michelle Kemp MRN: [id number] Date of Birth: 03-03-1941 Referring Provider: Sarina Ill  Encounter Date: 06/14/2015      OT End of Session - 06/14/15 1425    Visit Number 22   Number of Visits 30   Date for OT Re-Evaluation 07/09/15   Authorization Type self pay   OT Start Time 1317   OT Stop Time 1400   OT Time Calculation (min) 43 min   Equipment Utilized During Treatment ice probe   Activity Tolerance Patient tolerated treatment well   Behavior During Therapy [facility] for tasks assessed/performed      Past Medical History  Diagnosis Date  . Stroke ([place])   . Arthritis     Past Surgical History  Procedure Laterality Date  . Hernia repair  2001    Filed Vitals:   06/14/15 1327 06/14/15 1330 06/14/15 1411  BP: 160/114 140/108 142/90  Pulse: 101      Visit Diagnosis:  Stiffness of shoulder joint, left  Spastic hemiplegia affecting nondominant side (HCC)  Pain in joint, shoulder region, left      Subjective Assessment - 06/14/15 1411    Subjective  Patient reports no headache today - reports pain behind left knee    Patient is accompained by: Family member   Currently in Pain? Yes   Pain Score --  unable to grade   Pain Location Knee   Pain Orientation Left   Pain Descriptors / Indicators Aching   Pain Type Chronic pain                      OT Treatments/Exercises (OP) - 06/14/15 0001    ADLs   UB Dressing Able to doff front opening jacket with visula cues, initial mon contact, and increased time.  Patient looks to family members to complete ADL for her without initial attempt to complete.    ADL Comments After initial stand step transfer and one sit to stand transition, patient with elevated BP.  Considering BP last week with recent  headaches, determined that safest treatment option today may be supine to address UE limitations - spasticity, tightness and pain.     Neurological Re-education Exercises   Other Exercises 1 Following manual therapy to increase range to left shoulder flexion, abduction, horizontal adduction, and ice to medial elbow and gentle stretch to elbow flexion and extension, worked on active isolated control of elbow flexion, extension, pronation / supination, attempted wrist flexion, extension, and digit flexion .  Patient with active thumb motion and able to flex, oppose toward targets with left thumb.  Patient initiates most attempts at left UE movement with elbow flexion, shoulder internal rotation / adduction.  With cueing for deep breathing, and cueing to use vision to help guide movement, able to isolate some movement in elbow, forearm and digits.     Cryotherapy   Number Minutes Cryotherapy 2 Minutes   Cryotherapy Location --  elbow - medial aspect   Type of Cryotherapy Ice massage   Manual Therapy   Manual Therapy Myofascial release   Manual therapy comments Patient with tenderness to elbow (medial aspect) with end range of flexion.  Patient responded well to ice massage and myofascial release to both upper  arm to increase elbow range, adn lower arm to improve supination / pronation. Myofascial release in  combination with prolonged stretch and deep breathing was helpful in achieving improved supination in left forearm - passively.  Patient unable to activate supination this session.                  OT Education - 06/14/15 1425    Education provided Yes   Education Details benefits of ice to decrease pain in left elbow   Person(s) Educated Patient;Caregiver(s)   Methods Explanation   Comprehension Verbalized understanding          OT Short Term Goals - 03/31/15 1618    OT SHORT TERM GOAL #1   Title Patient will don pull over shirt without assistance (goals due 03/19/15)   Status Not  Met   OT SHORT TERM GOAL #2   Title Patient will transition from sit to stand with mod assist as a precursos to eventually allow family to adjust clothing prior to and following toileting   Status Achieved   OT SHORT TERM GOAL #3   Title Patient will report pain no greater than 2-3/10 after positioning in bed by caregivers to allow her to sleep restfully   Status Achieved   OT SHORT TERM GOAL #4   Title Patient will reach forward toward lower legs and feet and sustaing position for 10 seconds to aide with lower body bathing and dressing.   Status Achieved   OT SHORT TERM GOAL #5   Title Patient will demonstrate active grasp and release  in left hand with minimal facilitation, to aide with meal prep tasks as a precursor to simple cooking task   Status Achieved           OT Long Term Goals - 06/07/15 1503    OT LONG TERM GOAL #1   Title Patient will don lower body clothing with moderate assistance while seated, adn transition to standing to pull up clothing (goals due 10/7)   Status On-going   OT LONG TERM GOAL #2   Title Patient will transfer to commode with moderate assistance for toileting   Status Achieved   OT LONG TERM GOAL #3   Title Patient will stand with no greater than min assist to allow family to complete toilet hygiene    Status Achieved   OT LONG TERM GOAL #4   Title Patient will be able to reposition left arm for comfort in upright and reclined positions - such that pain experienced is no greater than 2/10   Status On-going               Plan - 06/14/15 1426    Clinical Impression Statement Patient with recent increase in blood pressure which has limited functional mobility during therapy sessions.  Patient with significant spasticity and pain in left upper extremity, limiting functional use of this side.    Pt will benefit from skilled therapeutic intervention in order to improve on the following deficits (Retired) Decreased activity tolerance;Decreased  balance;Decreased coordination;Decreased endurance;Impaired flexibility;Impaired perceived functional ability;Decreased safety awareness;Decreased range of motion;Decreased mobility;Decreased knowledge of use of DME;Decreased knowledge of precautions;Decreased strength;Increased edema;Increased muscle spasms;Impaired sensation;Pain;Impaired tone;Impaired UE functional use;Impaired vision/preception   Rehab Potential Good   Clinical Impairments Affecting Rehab Potential limited financial resources   OT Frequency 2x / week   OT Duration 8 weeks   OT Treatment/Interventions Self-care/ADL training;Electrical Stimulation;Cryotherapy;Moist Heat;Visual/perceptual remediation/compensation;Patient/family education;Balance training;Splinting;Therapeutic exercises;Therapeutic activities;Neuromuscular education;Functional Mobility Training;Fluidtherapy;Biofeedback;DME and/or AE instruction   Plan ADL, NMR LUE, Functional mobility - monitor BP   OT Home Exercise Plan on going  Consulted and Agree with Plan of Care Patient        Problem List Patient Active Problem List   Diagnosis Date Noted  . 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 Hemiparesis ([place]) 02/03/2015  . Spastic hemiplegia affecting nondominant side ([place]) 02/03/2015    Mariah Milling, OTR/L 06/14/2015, 2:30 PM  [facility] [address] [place], [place], [address] Phone: [phone]   Fax:  [phone]  Name: Michelle Kemp MRN: [id number] Date of Birth: [birth year]

## 2015-06-14 NOTE — Therapy (Signed)
[facility] [address] [place], [place], [address] Phone: [phone]   Fax:  [phone]  Physical Therapy Treatment  Patient Details  Name: Michelle Kemp MRN: [id number] Date of Birth: 09/17/1940 No Data Recorded  Encounter Date: 06/14/2015      PT End of Session - 06/14/15 1449    Visit Number 26   Number of Visits 32   Date for PT Re-Evaluation 06/08/15   Authorization Type Self pay, no eligibility to apply for benefits per daughter's reports   PT Start Time 1400   PT Stop Time 1435   PT Time Calculation (min) 35 min   Equipment Utilized During Treatment Gait belt   Behavior During Therapy WFL for tasks assessed/performed      Past Medical History  Diagnosis Date  . Stroke (HCC)   . Arthritis     Past Surgical History  Procedure Laterality Date  . Hernia repair  2001    Filed Vitals:   06/14/15 1455 06/14/15 1457 06/14/15 1458  BP: 144/96 140/104 140/102    Visit Diagnosis:  Stiffness of shoulder joint, left  Spastic hemiplegia affecting nondominant side (HCC)  Unsteadiness on feet  Postural instability  Abnormality of gait  Muscle weakness      Subjective Assessment - 06/14/15 1434    Subjective Pt family reported no changes in status other than the increased BP. Family reports she has Dr. appointment on DEC. 12. Family verbalized understanding of what to do in the case of an emergency.    Currently in Pain? Yes   Pain Location Knee   Pain Orientation Left   Pain Descriptors / Indicators Aching   Pain Type Chronic pain   Pain Onset Yesterday   Pain Frequency Constant   Aggravating Factors  not stated   Pain Relieving Factors not stated.      NMR: To facilitate moving and weightbearing in the L hemiparetic side while maintaining function of unaffected R side. Verbal, tactile and visual cues for L LE advancement, posture and recovery from trunk flexion upon reaching.   Standing  balance: 3min with reaching of unaffected side. Pt required min assist + 2 for safety.   Standing balance with balloon batting with unaffected side. Pt required min assist + 2 for safety.  Standing balance with bean bag reach across and toss with R UE. Pt completed 2 reps X 13. Pt required min assist + 2 for safety. Verbal   Seated straight leg stretch on stool to posterior chain of L affected extremity to maintain knee joint integrity. 2 reps X30sec. Pt max assist to lift leg onto stool.         PT Short Term Goals - 06/14/15 1450    PT SHORT TERM GOAL #1   Title The patient will tolerate standing program with family assistance x 5 minutes for improved weight bearing and assist for ADLs. Target date 05/09/2015   Baseline 11/2: Achieved, per pt report.   Status Achieved   PT SHORT TERM GOAL #2   Title The patient will move sit<>supine with CGA. Target date 05/09/2015   Baseline 11/2: pt required min to mod A and 50% cueing.   Status Not Met   PT SHORT TERM GOAL #3   Title The patient will transfer w/c<>mat with sliding board with CGA of 1 person. Target date 05/09/2015   Baseline Deferred, as pt currently performing standing pivot transfers with family.   Status Deferred   PT SHORT TERM GOAL #  4   Title The patient will propel manual wheelchair with minimal verbal cues.   Baseline Target date 05/09/2015   Status Not Met   PT SHORT TERM GOAL #5   Title The patient will ambulate in parallel bars with +2 assist x 5 feet to demo improved postural control and strength for standing tasks. Target date 05/09/2015   Baseline 11/2: Pt has consistently ambulated with min to max A using L PFRW.   Status Achieved           PT Long Term Goals - 06/14/15 1451    PT LONG TERM GOAL #1   Title The patient/family will verbalize understanding of CVA warning signs/risk factors.   Baseline Achieved: 06/07/15. Pt and nephew only able to verbalize signs. Informatoin on risk factors and warning signs  reveiwed with them. Written copies provided.   Time 8   Period Weeks   Status Achieved   PT LONG TERM GOAL #2   Title The patient will move sit<>stand with CGA of 1 person with UE support.   Baseline Assessed: 06/07/15. Pt min assist +1 with sit<>stand with RW with platform. Pt also min assist + one with RW without platform.   Time 8   Period Weeks   Status Not Met   PT LONG TERM GOAL #3   Title The patient will transfer w/c<>mat with squat pivot or boost transfer with CGA for safety.   Baseline Goal was deffered because pt is currently doing standing pivot transfer to enter car.   Time 8   Period Weeks   Status Deferred   PT LONG TERM GOAL #4   Title The patient will ambulate x 10 feet with mod A of 1 person with least restrictive assistive device.   Baseline Achieved: 06/07/15. Pt min assist +1 due to difficulty steering RW with L UE. second person for safetey due to multiple needs with gait (for gait deviations and walker management, especially with turing to sit down).   Time 8   Period Weeks   Status Achieved   PT LONG TERM GOAL #5   Title The patient and family will demo indep with HEP for post d/c plan.   Baseline Target date 06/08/2015. .   Time 8   Period Weeks   Status Achieved           Plan - 06/14/15 1459    Clinical Impression Statement Skilled treatment intervention focused on monitoring BP for safety and standing balance with reaching with R UE to both sides. Pt able to complete activities with min assist +2 with verbal and tactile cues for advancing L LE., proper posture, and recovery from trunk flexion.  Pt presented with elevated BP today (144/96mmHg). When diastolic BP did not decrease with 10min rest break the session was terminated for pt safety. Treatment limited to standing balance due to BP.    PT Next Visit Plan Continue working towards remaining goals.  Monitor BP.  Continue working on standing balance activities and gait.    Consulted and Agree with  Plan of Care Patient;Family member/caregiver   Family Member Consulted family friend        Problem List Patient Active Problem List   Diagnosis Date Noted  . Hemorrhage, intracerebral (HCC) 02/03/2015  . Hemiparesis (HCC) 02/03/2015  . Spastic hemiplegia affecting nondominant side (HCC) 02/03/2015    Derrick Garwood 06/14/2015, 3:12 PM  Derrick Garwood, SPTA Name: Michelle Kemp MRN: [id number] Date of Birth: 06/05/1941  This note has been   reviewed and edited by supervising CI.  Willow Ora, PTA, [facility] [address] [place], [place] [address] [phone] 06/15/2015, 3:01 PM

## 2015-06-16 ENCOUNTER — Ambulatory Visit: Payer: Self-pay | Admitting: Occupational Therapy

## 2015-06-16 ENCOUNTER — Ambulatory Visit: Payer: Self-pay | Admitting: Physical Therapy

## 2015-06-16 ENCOUNTER — Encounter: Payer: Self-pay | Admitting: Physical Therapy

## 2015-06-16 ENCOUNTER — Encounter: Payer: Self-pay | Admitting: Occupational Therapy

## 2015-06-16 VITALS — BP 140/102

## 2015-06-16 DIAGNOSIS — M6281 Muscle weakness (generalized): Secondary | ICD-10-CM

## 2015-06-16 DIAGNOSIS — R293 Abnormal posture: Secondary | ICD-10-CM

## 2015-06-16 DIAGNOSIS — R2681 Unsteadiness on feet: Secondary | ICD-10-CM

## 2015-06-16 DIAGNOSIS — G8110 Spastic hemiplegia affecting unspecified side: Secondary | ICD-10-CM

## 2015-06-16 DIAGNOSIS — R269 Unspecified abnormalities of gait and mobility: Secondary | ICD-10-CM

## 2015-06-16 NOTE — Therapy (Signed)
[facility] [address] [place], [place], [address] Phone: [phone]   Fax:  [phone]  Occupational Therapy Treatment  Patient Details  Name: Michelle Kemp MRN: [id number] Date of Birth: July 27, 1940 Referring Provider: Sarina Ill  Encounter Date: 06/16/2015      OT End of Session - 06/16/15 1659    Visit Number 23   Number of Visits 30   Date for OT Re-Evaluation 07/09/15   Authorization Type self pay   OT Start Time 1315   OT Stop Time 1358   OT Time Calculation (min) 43 min   Activity Tolerance Patient tolerated treatment well   Behavior During Therapy [facility] for tasks assessed/performed      Past Medical History  Diagnosis Date  . Stroke ([place])   . Arthritis     Past Surgical History  Procedure Laterality Date  . Hernia repair  2001    There were no vitals filed for this visit.  Visit Diagnosis:  Spastic hemiplegia affecting nondominant side (HCC)  Unsteadiness on feet      Subjective Assessment - 06/16/15 1613    Subjective  Patient indicates feeling less pain in leg today, also reports brace on her hand is helping.   Patient is accompained by: Family member   Currently in Pain? No/denies   Pain Score 0-No pain                      OT Treatments/Exercises (OP) - 06/16/15 0001    Neurological Re-education Exercises   Other Exercises 1 Neuromuscular reeducation to address sit to stand and stand balance.  Initial verbal cueing for proper starting alignment and alignment throughout transition from sit to stand.  Patient needs only minimal assistance for sufficient forward weight shift to stand.  Once in standing patient resists weight shift onto her stronger right side.  Able to reach toward targets on the right to better activate bilateral lower extremities.     Other Exercises 2 Neuromuscular reed to distal left arm to addrees length of soft tissue for alignment, followed by  activation.  Patient able to activate forearm supination through 1/3 range following prep work.                 OT Education - 06/16/15 1658    Education provided Yes   Education Details Importance of actively moving left UE outside of therapy session.  BP parameters for therapy   Person(s) Educated Patient;Caregiver(s)   Methods Explanation   Comprehension Need further instruction          OT Short Term Goals - 03/31/15 1618    OT SHORT TERM GOAL #1   Title Patient will don pull over shirt without assistance (goals due 03/19/15)   Status Not Met   OT SHORT TERM GOAL #2   Title Patient will transition from sit to stand with mod assist as a precursos to eventually allow family to adjust clothing prior to and following toileting   Status Achieved   OT SHORT TERM GOAL #3   Title Patient will report pain no greater than 2-3/10 after positioning in bed by caregivers to allow her to sleep restfully   Status Achieved   OT SHORT TERM GOAL #4   Title Patient will reach forward toward lower legs and feet and sustaing position for 10 seconds to aide with lower body bathing and dressing.   Status Achieved   OT SHORT TERM GOAL #5   Title Patient  will demonstrate active grasp and release  in left hand with minimal facilitation, to aide with meal prep tasks as a precursor to simple cooking task   Status Achieved           OT Long Term Goals - 06/07/15 1503    OT LONG TERM GOAL #1   Title Patient will don lower body clothing with moderate assistance while seated, adn transition to standing to pull up clothing (goals due 10/7)   Status On-going   OT LONG TERM GOAL #2   Title Patient will transfer to commode with moderate assistance for toileting   Status Achieved   OT LONG TERM GOAL #3   Title Patient will stand with no greater than min assist to allow family to complete toilet hygiene    Status Achieved   OT LONG TERM GOAL #4   Title Patient will be able to reposition left arm  for comfort in upright and reclined positions - such that pain experienced is no greater than 2/10   Status On-going               Plan - 06/16/15 1659    Clinical Impression Statement Patient with recent increase in blood pressure, limiting functional mobility tasks.  MD aware, and patient with appointment next week.     Pt will benefit from skilled therapeutic intervention in order to improve on the following deficits (Retired) Decreased activity tolerance;Decreased balance;Decreased coordination;Decreased endurance;Impaired flexibility;Impaired perceived functional ability;Decreased safety awareness;Decreased range of motion;Decreased mobility;Decreased knowledge of use of DME;Decreased knowledge of precautions;Decreased strength;Increased edema;Increased muscle spasms;Impaired sensation;Pain;Impaired tone;Impaired UE functional use;Impaired vision/preception   Rehab Potential Good   Clinical Impairments Affecting Rehab Potential limited financial resources   OT Frequency 2x / week   OT Duration 8 weeks   OT Treatment/Interventions Self-care/ADL training;Electrical Stimulation;Cryotherapy;Moist Heat;Visual/perceptual remediation/compensation;Patient/family education;Balance training;Splinting;Therapeutic exercises;Therapeutic activities;Neuromuscular education;Functional Mobility Training;Fluidtherapy;Biofeedback;DME and/or AE instruction   Plan ADL, NMR, functional mobility   OT Home Exercise Plan on going   Consulted and Agree with Plan of Care Patient        Problem List Patient Active Problem List   Diagnosis Date Noted  . 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 Hemiparesis ([place]) 02/03/2015  . Spastic hemiplegia affecting nondominant side ([place]) 02/03/2015    Mariah Milling, OTR/L 06/16/2015, 5:01 PM  [facility] [address] [place], [place], [address] Phone: [phone]   Fax:   [phone]  Name: Michelle Kemp MRN: [id number] Date of Birth: Jun 04, 1941

## 2015-06-16 NOTE — Patient Instructions (Signed)
Pt. and family instructed on danger of high blood pressure and rationale for conservative treatment with high diastolic blood pressure.

## 2015-06-16 NOTE — Therapy (Signed)
[facility] [address] [place], [place], [address] Phone: [phone]   Fax:  [phone]  Physical Therapy Treatment  Patient Details  Name: Michelle Kemp MRN: [id number] Date of Birth: [birth year] No Data Recorded  Encounter Date: 06/16/2015      PT End of Session - 06/16/15 1453    Visit Number 27   Number of Visits 42   Date for PT Re-Evaluation 08/07/15  60 days from start of recert daye (39/03/00)   Authorization Type Self pay, no eligibility to apply for benefits per daughter's reports   PT Start Time 1400   PT Stop Time 1432   PT Time Calculation (min) 32 min   Equipment Utilized During Treatment Gait belt   Activity Tolerance Other (comment)  Pt. treatment limited by high BP.    Behavior During Therapy [facility] for tasks assessed/performed      Past Medical History  Diagnosis Date  . Stroke ([place])   . Arthritis     Past Surgical History  Procedure Laterality Date  . Hernia repair  2001    Filed Vitals:   06/16/15 1446  BP: 140/102    Visit Diagnosis:  Spastic hemiplegia affecting nondominant side (HCC)  Unsteadiness on feet  Postural instability  Abnormality of gait  Muscle weakness      Subjective Assessment - 06/16/15 1448    Subjective Pt. family reports no falls, pain, or other complaints however BP continues to be high.  Family confirmed Dr. appointment on [place]. 12th to address high BP.     Patient Stated Goals Improve mobility and endurance   Currently in Pain? No/denies   Pain Score 0-No pain   Multiple Pain Sites No     Therex (to improve LLE flexibility, and strength): Sitting on mat table:  * pt. BP taken manually 140/102 mmHg initially.    - AAROM heel slides with foot on towel and sliding board x 10 reps;  pt. able to perform partial range but required mod/max assist for remaining range.  - AAROM LAQ on L x 10 reps; pt. able to perform partial range but required  mod/max assist for remaining range; manual BP post exercise 136/100 mmHg.  - PROM hamstring / heel cord stretch with gait belt on stool x 30 sec on L and R; pt. required verbal cues from family to perform with proper technique; manual BP post stretch 132/100 mmHg.   * pt. session cut short following BP reading of 132/100 mmHg.   * pt. performed standing pivot transfer mat table > w/c with mod assist from family and therapist.            PT Short Term Goals - 06/14/15 1450    PT SHORT TERM GOAL #1   Title The patient will tolerate standing program with family assistance x 5 minutes for improved weight bearing and assist for ADLs. Target date 05/09/2015   Baseline 11/2: Achieved, per pt report.   Status Achieved   PT SHORT TERM GOAL #2   Title The patient will consistently perform sit<>supine with min A and 25% cueing to indicate increased independence with bed mobility.  Modified Target date: 07/06/15   Baseline 11/2: pt required min to mod A and 50% cueing. REVISED from CGA to min A, 25% cueing.  Continue goal through renewed POC.   Status Revised   PT SHORT TERM GOAL #3   Title The patient will transfer w/c<>mat with sliding board with CGA of 1 person.  Target date 05/09/2015   Baseline Deferred, as pt currently performing standing pivot transfers with family.   Status Deferred   PT SHORT TERM GOAL #4   Title The patient will propel manual wheelchair with minimal verbal cues.   Baseline Deferred, as pt now ambulating with PFRW.   Status Deferred   PT SHORT TERM GOAL #5   Title The patient will ambulate in parallel bars with +2 assist x 5 feet to demo improved postural control and strength for standing tasks. Target date 05/09/2015   Baseline 11/2: Pt has consistently ambulated with min to max A using L PFRW.   Status Achieved   Additional Short Term Goals   Additional Short Term Goals Yes   PT SHORT TERM GOAL #6   Title The patient will move sit <> stand with supervision with  LRAD to indicate increased independence with transfers. Target date: 07/06/15.   Baseline Min A for transfers with/without PFRW.   Status New   PT SHORT TERM GOAL #7   Title Pt will consistently ambulate x75' over level, indoor surfaces with mod A of 1 therapist using LRAD for increased independence with household ambulation. Target date: 07/06/15   Baseline Ambulates with min A for stability/balance but requires +2A for safe RW advancement.   Status New           PT Long Term Goals - 06/14/15 1451    PT LONG TERM GOAL #1   Title The patient/family will verbalize understanding of CVA warning signs/risk factors. Modified Target date: 08/03/15   Baseline 06/07/15. Pt and nephew only able to verbalize signs. Information on risk factors and warning signs reveiwed with them. Written copies provided.  Continue goal through renewed POC   Time 8   Period Weeks   Status Partially Met   PT LONG TERM GOAL #2   Title The patient will move sit <> stand with CGA of one person.    Baseline 11/28: Pt requires min A with and without PFRW.  Continue as STG through renewed POC. See STG's.   Time 8   Period Weeks   Status Partially Met   PT LONG TERM GOAL #3   Title The patient will transfer w/c<>mat with squat pivot or boost transfer with CGA for safety.   Baseline Goal was deferred because pt is currently doing standing pivot transfer to enter car.   Time 8   Period Weeks   Status Deferred   PT LONG TERM GOAL #4   Title The patient will ambulate x 10 feet with mod A of 1 person with least restrictive assistive device. Modified Target date: 08/03/15   Baseline 11/28: min A required for stability/balance but +2A required for safe advancement of RW.  Will not continue goal through [place]; see LTG #8 for updated goal   Time 8   Period Weeks   Status Partially Met   PT LONG TERM GOAL #5   Title The patient and family will demo indep with HEP for post d/c plan.   Baseline Target date 06/08/2015. .    Time 8   Period Weeks   Status Achieved   Additional Long Term Goals   Additional Long Term Goals Yes   PT LONG TERM GOAL #6   Title Pt will perform supine <> sit with supervision to indicate increased independence getting into/out of bed. Target date: 08/03/15   Baseline Min-Mod A   Status New   PT LONG TERM GOAL #7   Title Pt  will consistently perform stand pivot transfers from w/c <> mat table with supervision using LRAD. Target date: 08/03/15   Status New   PT LONG TERM GOAL #8   Title Pt will consistently perform gait x75' over level, indoor surfaces with min A using LRAD to indicate increased independence with household ambulation. Target date: 08/03/15   Status New            Plan - 06/16/15 1454    Clinical Impression Statement Pt. session cut short after high blood pressure at 136/100 mmHg following passive stretching and mild activity.  Family instructed on danger of high BP following stroke; family verbalized understanding of precaution.  Family reports pt. to see Dr. regarding high BP Monday Dec. 12th.     PT Next Visit Plan Continue working towards remaining goals.  Monitor BP.  Continue working on standing balance activities and gait.    Consulted and Agree with Plan of Care Patient;Family member/caregiver   Family Member Consulted family friend        Problem List Patient Active Problem List   Diagnosis Date Noted  . 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 Hemiparesis ([place]) 02/03/2015  . Spastic hemiplegia affecting nondominant side ([place]) 02/03/2015    Bess Harvest 06/16/2015, 2:59 PM  [place], [place]   Name: Korey Arroyo MRN: [id number] Date of Birth: 1941-03-10  This note has been reviewed and edited by supervising CI.  Willow Ora, PTA, [facility] [address], [place], [place] [address] [phone] 06/17/2015, 3:16 PM

## 2015-06-20 ENCOUNTER — Ambulatory Visit
Admission: RE | Admit: 2015-06-20 | Discharge: 2015-06-20 | Disposition: A | Discharge status: Home / Self Care | Payer: Self-pay | Source: Ambulatory Visit | Attending: Neurology | Admitting: Neurology

## 2015-06-20 DIAGNOSIS — G8110 Spastic hemiplegia affecting unspecified side: Secondary | ICD-10-CM

## 2015-06-20 DIAGNOSIS — I610 Nontraumatic intracerebral hemorrhage in hemisphere, subcortical: Secondary | ICD-10-CM

## 2015-06-20 DIAGNOSIS — G8190 Hemiplegia, unspecified affecting unspecified side: Secondary | ICD-10-CM

## 2015-06-21 ENCOUNTER — Ambulatory Visit: Payer: Self-pay | Admitting: Occupational Therapy

## 2015-06-21 ENCOUNTER — Ambulatory Visit: Payer: Self-pay | Admitting: Physical Therapy

## 2015-06-21 ENCOUNTER — Encounter: Payer: Self-pay | Admitting: Neurology

## 2015-06-21 ENCOUNTER — Encounter: Payer: Self-pay | Admitting: Physical Therapy

## 2015-06-21 ENCOUNTER — Ambulatory Visit (INDEPENDENT_AMBULATORY_CARE_PROVIDER_SITE_OTHER): Payer: Self-pay | Admitting: Neurology

## 2015-06-21 ENCOUNTER — Encounter: Payer: Self-pay | Admitting: Occupational Therapy

## 2015-06-21 VITALS — BP 146/95

## 2015-06-21 VITALS — BP 145/101

## 2015-06-21 VITALS — BP 124/90 | HR 85 | Ht 63.0 in

## 2015-06-21 DIAGNOSIS — R569 Unspecified convulsions: Secondary | ICD-10-CM

## 2015-06-21 DIAGNOSIS — G8110 Spastic hemiplegia affecting unspecified side: Secondary | ICD-10-CM

## 2015-06-21 DIAGNOSIS — R293 Abnormal posture: Secondary | ICD-10-CM

## 2015-06-21 DIAGNOSIS — I610 Nontraumatic intracerebral hemorrhage in hemisphere, subcortical: Secondary | ICD-10-CM

## 2015-06-21 DIAGNOSIS — R2681 Unsteadiness on feet: Secondary | ICD-10-CM

## 2015-06-21 DIAGNOSIS — M6281 Muscle weakness (generalized): Secondary | ICD-10-CM

## 2015-06-21 DIAGNOSIS — R269 Unspecified abnormalities of gait and mobility: Secondary | ICD-10-CM

## 2015-06-21 MED ORDER — LEVETIRACETAM 250 MG PO TABS: 250.0000 mg | ORAL_TABLET | Freq: Two times a day (BID) | ORAL | Status: DC

## 2015-06-21 MED ORDER — LEVETIRACETAM 500 MG PO TABS: 500.0000 mg | ORAL_TABLET | Freq: Two times a day (BID) | ORAL | Status: DC

## 2015-06-21 NOTE — Therapy (Signed)
[facility] [address] [place], [place], [address] Phone: [phone]   Fax:  [phone]  Physical Therapy Treatment  Patient Details  Name: Michelle Kemp MRN: [id number] Date of Birth: [birth year] No Data Recorded  Encounter Date: 06/21/2015      PT End of Session - 06/21/15 1705    Visit Number 28   Number of Visits 42   Date for PT Re-Evaluation 08/07/15   Authorization Type Self pay, no eligibility to apply for benefits per daughter's reports   PT Start Time 1406   PT Stop Time 1450   PT Time Calculation (min) 44 min      Past Medical History  Diagnosis Date  . Stroke ([place])   . Arthritis     Past Surgical History  Procedure Laterality Date  . Hernia repair  2001    Filed Vitals:   06/21/15 1413  BP: 146/95    Visit Diagnosis:  Spastic hemiplegia affecting nondominant side (HCC)  Abnormality of gait  Muscle weakness      Subjective Assessment - 06/21/15 1657    Subjective Pt reports no pain today - really wants to work on her walking   Patient is accompained by: Family member   Patient Stated Goals Improve mobility and endurance   Currently in Pain? No/denies                         [facility] Adult PT Treatment/Exercise - 06/21/15 0001    Transfers   Sit to Stand 3: Mod assist  from wheelchair due to low seat height; min asst fr. chair   Sit to Stand Details Manual facilitation for weight shifting;Tactile cues for placement;Tactile cues for sequencing   Sit to Stand: Patient Percentage 80%   Stand to Sit 4: Min assist   Stand to Sit Details cues to sit down slowly and with controlled descent   Ambulation/Gait   Ambulation/Gait Yes   Ambulation/Gait Assistance 3: Mod assist   Ambulation/Gait Assistance Details blue shoe cover used on L shoe to fascilitate ease with swing through   Ambulation Distance (Feet) 14 Feet  18',12, 20', 15' with mod assist with RW   Assistive  device Rolling walker   Gait Pattern Decreased weight shift to right;Decreased hip/knee flexion - left;Decreased dorsiflexion - left;Trunk flexed;Decreased trunk rotation   Ambulation Surface Level;Indoor                  PT Short Term Goals - 06/14/15 1450    PT SHORT TERM GOAL #1   Title The patient will tolerate standing program with family assistance x 5 minutes for improved weight bearing and assist for ADLs. Target date 05/09/2015   Baseline 11/2: Achieved, per pt report.   Status Achieved   PT SHORT TERM GOAL #2   Title The patient will consistently perform sit<>supine with min A and 25% cueing to indicate increased independence with bed mobility.  Modified Target date: 07/06/15   Baseline 11/2: pt required min to mod A and 50% cueing. REVISED from CGA to min A, 25% cueing.  Continue goal through renewed POC.   Status Revised   PT SHORT TERM GOAL #3   Title The patient will transfer w/c<>mat with sliding board with CGA of 1 person. Target date 05/09/2015   Baseline Deferred, as pt currently performing standing pivot transfers with family.   Status Deferred   PT SHORT TERM GOAL #4   Title The patient  will propel manual wheelchair with minimal verbal cues.   Baseline Deferred, as pt now ambulating with PFRW.   Status Deferred   PT SHORT TERM GOAL #5   Title The patient will ambulate in parallel bars with +2 assist x 5 feet to demo improved postural control and strength for standing tasks. Target date 05/09/2015   Baseline 11/2: Pt has consistently ambulated with min to max A using L PFRW.   Status Achieved   Additional Short Term Goals   Additional Short Term Goals Yes   PT SHORT TERM GOAL #6   Title The patient will move sit <> stand with supervision with LRAD to indicate increased independence with transfers. Target date: 07/06/15.   Baseline Min A for transfers with/without PFRW.   Status New   PT SHORT TERM GOAL #7   Title Pt will consistently ambulate x75' over  level, indoor surfaces with mod A of 1 therapist using LRAD for increased independence with household ambulation. Target date: 07/06/15   Baseline Ambulates with min A for stability/balance but requires +2A for safe RW advancement.   Status New           PT Long Term Goals - 06/14/15 1451    PT LONG TERM GOAL #1   Title The patient/family will verbalize understanding of CVA warning signs/risk factors. Modified Target date: 08/03/15   Baseline 06/07/15. Pt and nephew only able to verbalize signs. Information on risk factors and warning signs reveiwed with them. Written copies provided.  Continue goal through renewed POC   Time 8   Period Weeks   Status Partially Met   PT LONG TERM GOAL #2   Title The patient will move sit <> stand with CGA of one person.    Baseline 11/28: Pt requires min A with and without PFRW.  Continue as STG through renewed POC. See STG's.   Time 8   Period Weeks   Status Partially Met   PT LONG TERM GOAL #3   Title The patient will transfer w/c<>mat with squat pivot or boost transfer with CGA for safety.   Baseline Goal was deferred because pt is currently doing standing pivot transfer to enter car.   Time 8   Period Weeks   Status Deferred   PT LONG TERM GOAL #4   Title The patient will ambulate x 10 feet with mod A of 1 person with least restrictive assistive device. Modified Target date: 08/03/15   Baseline 11/28: min A required for stability/balance but +2A required for safe advancement of RW.  Will not continue goal through [place]; see LTG #8 for updated goal   Time 8   Period Weeks   Status Partially Met   PT LONG TERM GOAL #5   Title The patient and family will demo indep with HEP for post d/c plan.   Baseline Target date 06/08/2015. .   Time 8   Period Weeks   Status Achieved   Additional Long Term Goals   Additional Long Term Goals Yes   PT LONG TERM GOAL #6   Title Pt will perform supine <> sit with supervision to indicate increased  independence getting into/out of bed. Target date: 08/03/15   Baseline Min-Mod A   Status New   PT LONG TERM GOAL #7   Title Pt will consistently perform stand pivot transfers from w/c <> mat table with supervision using LRAD. Target date: 08/03/15   Status New   PT LONG TERM GOAL #8   Title  Pt will consistently perform gait x75' over level, indoor surfaces with min A using LRAD to indicate increased independence with household ambulation. Target date: 08/03/15   Status New               Plan - 06/21/15 1706    Clinical Impression Statement Pt did very well with ambulation with use of RW and use of blue shoe cover on L shoe to fascilitate swing through; gait improved with use of assistance from OT to manually increase weight shift onto RLE to advance LLE in swing; pt also did better with sit to stand from higher chair height - would benefit from use of cushion in wheelchair and from leather toe cap on L shoe to incr. ease with swing through   Pt will benefit from skilled therapeutic intervention in order to improve on the following deficits Abnormal gait;Decreased balance;Decreased mobility;Decreased strength;Postural dysfunction;Impaired flexibility;Pain;Decreased activity tolerance;Decreased endurance;Increased muscle spasms;Difficulty walking   Rehab Potential Good   Clinical Impairments Affecting Rehab Potential patient private pay for services   PT Frequency 2x / week   PT Duration 8 weeks   PT Treatment/Interventions Electrical Stimulation;Gait training;Stair training;Neuromuscular re-education;Balance training;Therapeutic exercise;Therapeutic activities;Functional mobility training;Patient/family education;Wheelchair mobility training;Manual techniques;Orthotic Fit/Training   PT Next Visit Plan cont gait training   Consulted and Agree with Plan of Care Patient;Family member/caregiver        Problem List Patient Active Problem List   Diagnosis Date Noted  . Seizures ([place])  06/21/2015  . 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 Hemiparesis ([place]) 02/03/2015  . Spastic hemiplegia affecting nondominant side ([place]) 02/03/2015    Buffey Zabinski, [place], PT 06/21/2015, 5:11 PM  [facility] [address] [place], [place], [address] Phone: [phone]   Fax:  [phone]  Name: Michelle Kemp MRN: [id number] Date of Birth: 1940-11-24

## 2015-06-21 NOTE — Therapy (Signed)
[facility] [address] [place], [place], [address] Phone: [phone]   Fax:  [phone]  Occupational Therapy Treatment  Patient Details  Name: Michelle Kemp MRN: [id number] Date of Birth: September 14, 1940 Referring Provider: Sarina Ill  Encounter Date: 06/21/2015      OT End of Session - 06/21/15 1544    Visit Number 24   Number of Visits 30   Date for OT Re-Evaluation 07/09/15   Authorization Type self pay   OT Start Time 1445   OT Stop Time 1518  ended early for MD appointment with neuro   OT Time Calculation (min) 33 min   Activity Tolerance Patient tolerated treatment well   Behavior During Therapy [facility] for tasks assessed/performed      Past Medical History  Diagnosis Date  . Stroke ([place])   . Arthritis     Past Surgical History  Procedure Laterality Date  . Hernia repair  2001    Filed Vitals:   06/21/15 1526  BP: 145/101    Visit Diagnosis:  Unsteadiness on feet  Postural instability  Spastic hemiplegia affecting nondominant side (HCC)      Subjective Assessment - 06/21/15 1526    Subjective  Patient working with PT prior to OT session, and very pleased with her walking today.   Patient is accompained by: Family member   Currently in Pain? No/denies   Pain Score 0-No pain                      OT Treatments/Exercises (OP) - 06/21/15 0001    ADLs   Functional Mobility Patient is showing improvement with functional mobility walking in straight, unobstructed path.  Patient immediately requires greater assistance when different motor plan is required, e.g. turning, backing up, changing directions.  Patient is challenged to effectively manage walker  as left hand is limited in its ability to control device.  Patient also becomes nervous with changes in direction when up on feet.  Her nervousness leads to shortness of breath.  This can usually resolve with modeling deep breathing  and calm, soothing voice.     Neurological Re-education Exercises   Other Exercises 1 Neuromuscular reeducation to address weight shift in standing.  Patient has tendency to bear more weight on left side, which then makes it more difficult to scoot left hip forward, or unweight left foot to swing through.  Patient needs min phusical facilitation and/or physical target to weigh shift toward due to significant sensory and perceptal deficits.    Other Exercises 2 Neuromuscular reeducation in standing to address decreased reliance on UE's in upright positions.  Patient now consistently able to stand (static) for several seconds without assistance.  Patient able to take washclothfrom walker in front and wipe brow using left hand without assistance.                  OT Education - 06/21/15 1543    Education Details Importance of finding balance in standing, and weight shifting to right side when stepping with left leg   Person(s) Educated Patient;Caregiver(s)   Methods Explanation;Demonstration;Tactile cues;Verbal cues   Comprehension Returned demonstration;Need further instruction;Tactile cues required          OT Short Term Goals - 03/31/15 1618    OT SHORT TERM GOAL #1   Title Patient will don pull over shirt without assistance (goals due 03/19/15)   Status Not Met   OT SHORT TERM GOAL #2   Title  Patient will transition from sit to stand with mod assist as a precursos to eventually allow family to adjust clothing prior to and following toileting   Status Achieved   OT SHORT TERM GOAL #3   Title Patient will report pain no greater than 2-3/10 after positioning in bed by caregivers to allow her to sleep restfully   Status Achieved   OT SHORT TERM GOAL #4   Title Patient will reach forward toward lower legs and feet and sustaing position for 10 seconds to aide with lower body bathing and dressing.   Status Achieved   OT SHORT TERM GOAL #5   Title Patient will demonstrate active grasp  and release  in left hand with minimal facilitation, to aide with meal prep tasks as a precursor to simple cooking task   Status Achieved           OT Long Term Goals - 06/07/15 1503    OT LONG TERM GOAL #1   Title Patient will don lower body clothing with moderate assistance while seated, adn transition to standing to pull up clothing (goals due 10/7)   Status On-going   OT LONG TERM GOAL #2   Title Patient will transfer to commode with moderate assistance for toileting   Status Achieved   OT LONG TERM GOAL #3   Title Patient will stand with no greater than min assist to allow family to complete toilet hygiene    Status Achieved   OT LONG TERM GOAL #4   Title Patient will be able to reposition left arm for comfort in upright and reclined positions - such that pain experienced is no greater than 2/10   Status On-going               Plan - 06/21/15 1544    Clinical Impression Statement Patient continues to show improvement with functional mobility.  Patient has been limited at times by pain, increased muscle tension, and elevated BP.     Pt will benefit from skilled therapeutic intervention in order to improve on the following deficits (Retired) Decreased activity tolerance;Decreased balance;Decreased coordination;Decreased endurance;Impaired flexibility;Impaired perceived functional ability;Decreased safety awareness;Decreased range of motion;Decreased mobility;Decreased knowledge of use of DME;Decreased knowledge of precautions;Decreased strength;Increased edema;Increased muscle spasms;Impaired sensation;Pain;Impaired tone;Impaired UE functional use;Impaired vision/preception   Rehab Potential Good   OT Frequency 2x / week   OT Duration 8 weeks   OT Treatment/Interventions Self-care/ADL training;Electrical Stimulation;Cryotherapy;Moist Heat;Visual/perceptual remediation/compensation;Patient/family education;Balance training;Splinting;Therapeutic exercises;Therapeutic  activities;Neuromuscular education;Functional Mobility Training;Fluidtherapy;Biofeedback;DME and/or AE instruction   Plan adl, nmr, functional mobility   OT Home Exercise Plan on going   Consulted and Agree with Plan of Care Patient        Problem List Patient Active Problem List   Diagnosis Date Noted  . 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 Hemiparesis ([place]) 02/03/2015  . Spastic hemiplegia affecting nondominant side ([place]) 02/03/2015    Mariah Milling, OTR/L 06/21/2015, 3:47 PM  [facility] [address] [place], [place], [address] Phone: [phone]   Fax:  [phone]  Name: Michelle Kemp MRN: [id number] Date of Birth: [birth year]

## 2015-06-21 NOTE — Progress Notes (Signed)
STROKE NEUROLOGY FOLLOW UP NOTE  NAME: Michelle Kemp DOB: 1940-07-14  REASON FOR VISIT: stroke follow up HISTORY FROM: daughter and chart  Today we had the pleasure of seeing Michelle Kemp in follow-up at our Neurology Clinic. Pt was accompanied by daughter. Daughter acted as Equities trader.    History Summary 74 yo female from [place] with hx of arthritis is following up in stroke clinic for stroke. As per daughter, she was having vacation in [place] on 11/07/14 when she developed acute onset left arm and leg weakness, left leg stiff and spastic, with facial droop, left side drooling, and slurry speech. She was sent to nearby hospital, BP 200/120 and CT head on 11/13/14 showed right frontal hemorrhage. She was treated conservatively. 12/13/14 had MRA head showed diffuse intracranial atherosclerosis. 01/11/15 repeat CT showed near resolution of ICH. She was then travelled back from [place] to [place]. She denies any smoking, alcohol or illicit drugs.  Follow up 04/12/15 - she has been working with PT/OT for strength training. She saw Dr. Lucia Gaskins for follow up, has ordered MRI and MRA but not done due to insurance coverage. Daughter has been working with Child psychotherapist to get her disability approved.  She still has spastic left arm and leg, but also developed left shoulder, elbow, hip and knee pain which limited her rehab process.   ED visit 05/16/15 - as per chart, patient sent to ED for stroke evaluation. MRI showed no acute stroke. Patient sent back to home. However, asking for daughter, she stated that patient home starting have left facial twitching, and left arm and leg shaking jerking, no loss of the bowel bladder function, no loss of consciousness. When EMS came over, symptoms resolving, with increased weakness on the left arm and leg. In ED, patient back to her baseline. Patient was sent back home without medication changes.  Interval History During the interval time, patient had been doing the same. Her left  arm and leg stiffness since getting worse, decreased range of motion on shoulder, elbow and knee. With overstretching, patient started to feel pain in joints. Her MRI in November showed chronic right frontal ICH, no CAA, did not favor hemorrhagic transformation. She had MRA done today showed diffuse intracranial stenosis. 2-D Echo unremarkable. Carotid Doppler not done yet. Had recent lab with PCP, was told everything is good. Blood pressure at home ranging from 112-142. Today in clinic 124/90. Patient had PT OT twice a week.  REVIEW OF SYSTEMS: Full 14 system review of systems performed and notable only for those listed below and in HPI above, all others are negative:  Constitutional:   Cardiovascular: Leg swelling Ear/Nose/Throat:   Skin:  Eyes:  Eye discharge, eye itching, eye redness Respiratory:   Gastroitestinal:   Genitourinary:  Hematology/Lymphatic:   Endocrine:  Musculoskeletal:  Joint pain, aching muscles, muscles cramps Allergy/Immunology:   Neurological:   Psychiatric:  Sleep: Snoring  The following represents the patient's updated allergies and side effects list: No Known Allergies  The neurologically relevant items on the patient's problem list were reviewed on today's visit.  Neurologic Examination  A problem focused neurological exam (12 or more points of the single system neurologic examination, vital signs counts as 1 point, cranial nerves count for 8 points) was performed.  Blood pressure 124/90, pulse 85, height  (1.6 m).  General - Well nourished, well developed, in no apparent distress.  Ophthalmologic - Fundi not visualized due to noncooperation.  Cardiovascular - intermittent premature beat.  Mental Status -  Level of arousal and orientation to time, place, and person were intact. Language including expression, naming, repetition, comprehension was assessed and found intact. Fund of Knowledge was assessed and was impaired.  Cranial Nerves II -  XII - II - Visual field intact OU. III, IV, VI - Extraocular movements intact. V - Facial sensation intact bilaterally. VII - mild left nasolabial fold flattening. VIII - Hearing & vestibular intact bilaterally. X - Palate elevates symmetrically. XI - Chin turning & shoulder shrug intact bilaterally. XII - Tongue protrusion intact.  Motor Strength - The patient's strength was LUE 0/5 proximal and 2/5 distal, LLE 3-/5 proximal and 0/5 distally.  Bulk was normal and fasciculations were absent.   Motor Tone - Muscle tone was assessed at the neck and appendages and was significantly increased left UE and LE. Limited ROM of left shoulder and elbow, as well as knee. .  Reflexes - The patient's reflexes were 2+ in all extremities and she had no pathological reflexes.  Sensory - Light touch, temperature/pinprick were assessed and were normal.    Coordination - The patient had normal movements in the hands with no ataxia or dysmetria.  Tremor was absent.  Gait and Station - in wheelchair, gait not tested.  Data reviewed: I personally reviewed the images and agree with the radiology interpretations.  11/13/14 CT head - right frontal hemorrhage with remote small left cerebellar infarct  12/13/14 MRA head - diffuse intracranial atherosclerosis  01/11/15 CT head - resolving right frontal hemorrhage with remote small left cerebellar infarct.  MRI brain 05/16/2015 1. No acute infarct identified. 2. Posterior right frontal lobe encephalomalacia with internal complex fluid, favored to be hemorrhage with unresolved extracellular methemoglobin. Surrounding gliosis. No associated mass effect. 3. Intracranial artery dolichoectasia. Chronic lacunar infarcts Elsewhere.  MRA head 06/21/2015 1. Possible mild stenosis within the M2 segment of the right middle cerebral artery. This is less apparent on the source images and could be artifactual. 2. Reduced flow of the left vertebral artery. No focal  stenosis is noted. If clinically indicated, consider MR angiogram of the neck arteries to assess the origin of the vertebral artery. 3. Small regions of focal stenosis within the right posterior cerebral artery. 4. No aneurysms were identified.  2-D echo 05/14/2015 - Left ventricle: The cavity size was normal. Systolic function was normal. The estimated ejection fraction was in the range of 60% to 65%. Wall motion was normal; there were no regional wall motion abnormalities. Doppler parameters are consistent with abnormal left ventricular relaxation (grade 1 diastolic dysfunction). There was no evidence of elevated ventricular filling pressure by Doppler parameters. - Aortic valve: There was mild regurgitation. - Aortic root: The aortic root was normal in size. - Mitral valve: Structurally normal valve. There was mild regurgitation. - Left atrium: The atrium was normal in size. - Right ventricle: The cavity size was normal. Wall thickness was normal. Systolic function was normal. - Right atrium: The atrium was normal in size. - Tricuspid valve: There was trivial regurgitation. - Pulmonic valve: There was trivial regurgitation. - Pulmonary arteries: Systolic pressure was within the normal range. - Inferior vena cava: The vessel was normal in size. - Pericardium, extracardiac: There was no pericardial effusion.  Assessment: As you may recall, she is a 74 y.o. African American female with PMH of arthritis had acute onset left sided weakness and dysarthria on 11/07/14. CT was done on 11/13/14 showed right frontal ICH. She was treated conservatively. MRA in 12/2014 showed diffuse atherosclerosis. Repeat CT  head in 01/2015 showed near resolution of ICH with remote small left cerebellar infarct. Due to the delay of CT as well as without MRI image, it is hard to tell for sure it was ICH vs. Hemorrhagic infarct. Since the hematoma is absorbed, will start her on ASA 81. Had ED visit  on 05/16/2015 for left facial twitching and left arm and leg shaking jerking lasting short period time, concerning first simple partial seizure. MRI brain fever chronic right frontal ICH. MRA head again showed diffuse intracranial atherosclerosis. TTE unremarkable. CUS pending. Will request stroke labs from PCP's office. Continue PT/OT and refer to PMR again for left sided spasticity post stroke. Will order EEG, and put on Keppra for seizure control.  Plan:  - continue ASA for stroke prevention - Obtain recent lab results from PCP's office  - Pending carotid Doppler and EEG  - Initiate keppra for seizure control. Will start with low-dose 250mg  bid and then 500mg  bid  - Again referral to PMR for botox injection ASAP for left spastic hemiplegia  - Follow up with your primary care physician for stroke risk factor modification. Recommend maintain blood pressure goal <130/80, diabetes with hemoglobin A1c goal below 6.5% and lipids with LDL cholesterol goal below 70 mg/dL.  - continue PT/OT - check BP at home. - aggressive home exercise - follow up in 3 months.  A total of 45 minutes was spent face-to-face with this patient. Over half this time was spent on counseling patient on the seizure diagnosis and different diagnostic and therapeutic options available. In addition, also discuss about spastic hemiplegia, both injection and aggressive PT OT.   Orders Placed This Encounter  Procedures  . Ambulatory referral to Physical Medicine Rehab    Referral Priority:  Urgent    Referral Type:  Rehabilitation    Referral Reason:  Specialty Services Required    Requested Specialty:  Physical Medicine and Rehabilitation    Number of Visits Requested:  1  . EEG adult    Standing Status: Future     Number of Occurrences:      Standing Expiration Date: 06/20/2016    Meds ordered this encounter  Medications  . HYDROcodone-acetaminophen (NORCO/VICODIN) 5-325 MG tablet    Sig: Take 1 tablet by mouth every  6 (six) hours as needed for moderate pain.  . Fish Oil OIL    Sig: by Does not apply route.  . levETIRAcetam (KEPPRA) 250 MG tablet    Sig: Take 1 tablet (250 mg total) by mouth 2 (two) times daily.    Dispense:  14 tablet    Refill:  0  . levETIRAcetam (KEPPRA) 500 MG tablet    Sig: Take 1 tablet (500 mg total) by mouth 2 (two) times daily.    Dispense:  60 tablet    Refill:  2    Patient Instructions  - continue ASA for stroke prevention - will call Dr. Vonda Antigua office to get the recent lab results - will order carotid doppler and EEG  - prescribed keppra 250mg  twice a day for 7 days and then 500mg  twice a day after for seizure control. Watch for side effects. - has put another referral to rehab doctor to get botox injection for arm and leg stiffness  - Follow up with your primary care physician for stroke risk factor modification. Recommend maintain blood pressure goal <130/80, diabetes with hemoglobin A1c goal below 6.5% and lipids with LDL cholesterol goal below 70 mg/dL.  - continue PT/OT - check BP  at home. - aggressive home exercise - follow up in 3 months.    Marvel PlanJindong Lauris Keepers, MD PhD [facility] [address], [address] [place], [place] [address] [phone]  L

## 2015-06-21 NOTE — Patient Instructions (Signed)
-   continue ASA for stroke prevention - will call Dr. Vonda AntiguaBonsu's office to get the recent lab results - will order carotid doppler and EEG  - prescribed keppra 250mg  twice a day for 7 days and then 500mg  twice a day after for seizure control. Watch for side effects. - has put another referral to rehab doctor to get botox injection for arm and leg stiffness  - Follow up with your primary care physician for stroke risk factor modification. Recommend maintain blood pressure goal <130/80, diabetes with hemoglobin A1c goal below 6.5% and lipids with LDL cholesterol goal below 70 mg/dL.  - continue PT/OT - check BP at home. - aggressive home exercise - follow up in 3 months.

## 2015-06-22 NOTE — Addendum Note (Signed)
Addended by: Marvel PlanXU, Esme Freund on: 06/22/2015 08:16 AM   Modules accepted: Orders

## 2015-06-23 ENCOUNTER — Ambulatory Visit: Payer: Self-pay

## 2015-06-23 ENCOUNTER — Ambulatory Visit: Payer: Self-pay | Admitting: Occupational Therapy

## 2015-06-23 ENCOUNTER — Telehealth: Payer: Self-pay | Admitting: *Deleted

## 2015-06-23 ENCOUNTER — Encounter: Payer: Self-pay | Admitting: Occupational Therapy

## 2015-06-23 VITALS — BP 142/101 | HR 88

## 2015-06-23 DIAGNOSIS — G8110 Spastic hemiplegia affecting unspecified side: Secondary | ICD-10-CM

## 2015-06-23 DIAGNOSIS — R269 Unspecified abnormalities of gait and mobility: Secondary | ICD-10-CM

## 2015-06-23 DIAGNOSIS — M25612 Stiffness of left shoulder, not elsewhere classified: Secondary | ICD-10-CM

## 2015-06-23 DIAGNOSIS — M6281 Muscle weakness (generalized): Secondary | ICD-10-CM

## 2015-06-23 DIAGNOSIS — R293 Abnormal posture: Secondary | ICD-10-CM

## 2015-06-23 DIAGNOSIS — R2681 Unsteadiness on feet: Secondary | ICD-10-CM

## 2015-06-23 NOTE — Telephone Encounter (Signed)
-----   Message from Anson FretAntonia B Ahern, MD sent at 06/23/2015  3:13 PM EST ----- Kara MeadEmma, there is no significant stenosis, no aneurysms or vascular malformations. Thanks

## 2015-06-23 NOTE — Therapy (Signed)
[facility] [address] [place], [place], [address] Phone: [phone]   Fax:  [phone]  Occupational Therapy Treatment  Patient Details  Name: Michelle Kemp MRN: [id number] Date of Birth: 1941-03-16 Referring Provider: Sarina Ill  Encounter Date: 06/23/2015      OT End of Session - 06/23/15 1812    Visit Number 25   Number of Visits 30   Date for OT Re-Evaluation 07/09/15   Authorization Type self pay   OT Start Time 1535   OT Stop Time 1615   OT Time Calculation (min) 40 min   Activity Tolerance Patient tolerated treatment well   Behavior During Therapy [facility] for tasks assessed/performed      Past Medical History  Diagnosis Date  . Stroke ([place])   . Arthritis     Past Surgical History  Procedure Laterality Date  . Hernia repair  2001    There were no vitals filed for this visit.  Visit Diagnosis:  Spastic hemiplegia affecting nondominant side ([facility])  Unsteadiness on feet  Postural instability  Stiffness of shoulder joint, left      Subjective Assessment - 06/23/15 1751    Subjective  Patient with left knee pain today, although motivated for work on balance   Patient is accompained by: Family member   Currently in Pain? Yes   Pain Score 4    Pain Location Knee   Pain Orientation Left   Pain Descriptors / Indicators Aching   Pain Type Chronic pain   Pain Onset More than a month ago   Pain Frequency Intermittent   Aggravating Factors  standing   Pain Relieving Factors rest                      OT Treatments/Exercises (OP) - 06/23/15 1808    ADLs   Functional Mobility Worked today to address sit to stand independence from various height surfaces, with emphasis on midline orientation and forward weight shift.  Patient troubled by sore left knee today, yet motivated to continue to work.  Stand balance in free space without assistance - adding trunk rotation to right to look  over shoulder..  Patient consistently now able to stand without support for several seconds - minutes.  Practiced side stepping, and turning, backing up as these patterns are much more challenging.  Patient with immediate shortness of breath, and fear with new patterns, yet very trusting,a nd willing to work through fear.                  OT Education - 06/23/15 1812    Education provided Yes          OT Short Term Goals - 03/31/15 1618    OT SHORT TERM GOAL #1   Title Patient will don pull over shirt without assistance (goals due 03/19/15)   Status Not Met   OT SHORT TERM GOAL #2   Title Patient will transition from sit to stand with mod assist as a precursos to eventually allow family to adjust clothing prior to and following toileting   Status Achieved   OT SHORT TERM GOAL #3   Title Patient will report pain no greater than 2-3/10 after positioning in bed by caregivers to allow her to sleep restfully   Status Achieved   OT SHORT TERM GOAL #4   Title Patient will reach forward toward lower legs and feet and sustaing position for 10 seconds to aide with lower body bathing  and dressing.   Status Achieved   OT SHORT TERM GOAL #5   Title Patient will demonstrate active grasp and release  in left hand with minimal facilitation, to aide with meal prep tasks as a precursor to simple cooking task   Status Achieved           OT Long Term Goals - 06/23/15 1814    OT LONG TERM GOAL #4   Title Patient will be able to reposition left arm for comfort in upright and reclined positions - such that pain experienced is no greater than 2/10   Status Achieved               Plan - 06/23/15 1813    Clinical Impression Statement Patient shows continued progress with functional mobility at times needing no physical assistance for standing.   Pt will benefit from skilled therapeutic intervention in order to improve on the following deficits (Retired) Decreased activity  tolerance;Decreased balance;Decreased coordination;Decreased endurance;Impaired flexibility;Impaired perceived functional ability;Decreased safety awareness;Decreased range of motion;Decreased mobility;Decreased knowledge of use of DME;Decreased knowledge of precautions;Decreased strength;Increased edema;Increased muscle spasms;Impaired sensation;Pain;Impaired tone;Impaired UE functional use;Impaired vision/preception   Rehab Potential Good   Clinical Impairments Affecting Rehab Potential limited financial resources   OT Frequency 2x / week   OT Duration 8 weeks   OT Treatment/Interventions Self-care/ADL training;Electrical Stimulation;Cryotherapy;Moist Heat;Visual/perceptual remediation/compensation;Patient/family education;Balance training;Splinting;Therapeutic exercises;Therapeutic activities;Neuromuscular education;Functional Mobility Training;Fluidtherapy;Biofeedback;DME and/or AE instruction   Plan ADL, NMR, functional mobility   OT Home Exercise Plan on going   Consulted and Agree with Plan of Care Patient        Problem List Patient Active Problem List   Diagnosis Date Noted  . Seizures ([place]) 06/21/2015  . Hemorrhage, intracerebral ([place]) 02/03/2015  . Hemiparesis ([place]) 02/03/2015  . Spastic hemiplegia affecting nondominant side ([place]) 02/03/2015    Mariah Milling, OTR/L 06/23/2015, 6:15 PM  [facility] [address] [place], [place], [address] Phone: [phone]   Fax:  [phone]  Name: Michelle Kemp MRN: [id number] Date of Birth: Feb 08, 1941

## 2015-06-23 NOTE — Telephone Encounter (Signed)
Called interpreter line. Chose [place] language.  They called daughter number and LVM about results. Gave [facility] phone number if they have further questions.

## 2015-06-23 NOTE — Therapy (Signed)
[facility] [address] [place], [place], [address] Phone: [phone]   Fax:  [phone]  Physical Therapy Treatment  Patient Details  Name: Michelle Kemp MRN: [id number] Date of Birth: 1941-05-17 No Data Recorded  Encounter Date: 06/23/2015      PT End of Session - 06/23/15 1552    Visit Number 29   Number of Visits 42   Date for PT Re-Evaluation 08/07/15   Authorization Type Self pay, no eligibility to apply for benefits per daughter's reports   PT Start Time 1449   PT Stop Time 1533   PT Time Calculation (min) 44 min   Equipment Utilized During Treatment Gait belt   Activity Tolerance Patient limited by fatigue  and high BP 176/108 after ambulation   Behavior During Therapy [facility] for tasks assessed/performed      Past Medical History  Diagnosis Date  . Stroke (HCC)   . Arthritis     Past Surgical History  Procedure Laterality Date  . Hernia repair  2001    Filed Vitals:   06/23/15 1451  BP: 142/101  Pulse: 88    Visit Diagnosis:  Spastic hemiplegia affecting nondominant side (HCC)  Abnormality of gait  Muscle weakness      Subjective Assessment - 06/23/15 1453    Subjective Working on setting up for Botox injections for muscles   Patient is accompained by: Family member   Pain Score 0                         [facility] Adult PT Treatment/Exercise - 06/23/15 1537    Bed Mobility   Bed Mobility Sit to Supine;Supine to Sit;Sitting - Scoot to Edge of Bed   Supine to Sit 4: Min assist   Supine to Sit Details (indicate cue type and reason) assist for L LE off bed   Sitting - Scoot to Edge of Bed 3: Mod assist   Sitting - Scoot to Edge of Bed Details (indicate cue type and reason) assist to bring L hip out to EOB, pt able to weight shift   Sit to Sidelying Right 3: Mod assist   Sit to Sidelying Right Details (indicate cue type and reason) assist to bring both legs onto bed   Transfers   Transfers Sit to Stand;Stand to Sit;Stand Pivot Transfers   Sit to Stand 2: Max assist;3: Mod assist   Sit to Stand Details Manual facilitation for weight shifting;Tactile cues for placement;Tactile cues for sequencing   Sit to Stand Details (indicate cue type and reason) assist for up from low seat in w/c, improved after two pillows in bottom of seat   Sit to Stand: Patient Percentage 50%   Stand to Sit 4: Min assist   Stand to Sit Details (indicate cue type and reason) Visual cues for safe use of DME/AE   Stand to Sit Details cues for reaching for armrest on chair   Stand Pivot Transfers 3: Mod assist   Stand Pivot Transfer Details (indicate cue type and reason) assist for weight shift and for balance while turning to sit on mat   Ambulation/Gait   Ambulation/Gait Yes   Ambulation/Gait Assistance 1: +2 Total assist;3: Mod assist   Ambulation/Gait Assistance Details blue shoe cover on L foot and assist initially for L foot progression, then improved with A for R lateral weight shift,   Ambulation Distance (Feet) 15 Feet  10', 5'   Assistive device Rolling walker  and L metal upright AFO   Gait Pattern Step-to pattern;Decreased hip/knee flexion - left;Decreased step length - left;Decreased step length - right;Decreased stance time - left;Trunk rotated posteriorly on left   Ambulation Surface Level;Indoor   Exercises   Exercises Lumbar;Knee/Hip   Lumbar Exercises: Stretches   Passive Hamstring Stretch 2 reps;30 seconds  on L in supine   Lower Trunk Rotation 5 reps;10 seconds   Knee/Hip Exercises: Supine   Heel Slides AAROM;1 set;10 reps;Left   Other Supine Knee/Hip Exercises hip ER stretches in supine to L LE x 3 x 30 sec                PT Education - 06/23/15 1551    Education provided Yes   Education Details need for chair cushion for w/c to improve transfers and sitting posture   Person(s) Educated Caregiver(s);Patient  grandson   Methods Explanation    Comprehension Verbalized understanding;Need further instruction          PT Short Term Goals - 06/23/15 1556    PT SHORT TERM GOAL #1   Title The patient will tolerate standing program with family assistance x 5 minutes for improved weight bearing and assist for ADLs. Target date 05/09/2015   Baseline 11/2: Achieved, per pt report.   Status On-going   PT SHORT TERM GOAL #2   Title The patient will consistently perform sit<>supine with min A and 25% cueing to indicate increased independence with bed mobility.  Modified Target date: 07/06/15   Baseline 11/2: pt required min to mod A and 50% cueing. REVISED from CGA to min A, 25% cueing.  continue goal through renewed POC   Status On-going   PT SHORT TERM GOAL #3   Title The patient will transfer w/c<>mat with sliding board with CGA of 1 person. Target date 05/09/2015   Baseline Deferred, as pt currently performing standing pivot transfers with family.   Status Deferred   PT SHORT TERM GOAL #4   Title The patient will propel manual wheelchair with minimal verbal cues.   Baseline Deferred, as pt now ambulating with PFRW.   Status Deferred   PT SHORT TERM GOAL #5   Title The patient will ambulate in parallel bars with +2 assist x 5 feet to demo improved postural control and strength for standing tasks. Target date 05/09/2015   Baseline 11/2: Pt has consistently ambulated with min to max A using L PFRW.   Status Achieved   PT SHORT TERM GOAL #6   Title The patient will move sit <> stand with supervision with LRAD to indicate increased independence with transfers. Target date: 07/06/15.   Baseline Min A for transfers with/without PFRW.   Status On-going   PT SHORT TERM GOAL #7   Title Pt will consistently ambulate x75' over level, indoor surfaces with mod A of 1 therapist using LRAD for increased independence with household ambulation. Target date: 07/06/15   Baseline Ambulates with min A for stability/balance but requires +2A for safe RW  advancement.   Status On-going           PT Long Term Goals - 06/23/15 1559    PT LONG TERM GOAL #1   Title The patient/family will verbalize understanding of CVA warning signs/risk factors. Modified Target date: 08/03/15   Baseline 06/07/15. Pt and nephew only able to verbalize signs. Information on risk factors and warning signs reveiwed with them. Written copies provided.  continue   Status On-going   PT LONG TERM GOAL #  2   Title The patient will move sit <> stand with CGA of one person.    Baseline 11/28: Pt requires min A with and without PFRW.  continue   PT LONG TERM GOAL #3   Title The patient will transfer w/c<>mat with squat pivot or boost transfer with CGA for safety.   Baseline Goal was deferred because pt is currently doing standing pivot transfer to enter car.   Status Deferred   PT LONG TERM GOAL #4   Title The patient will ambulate x 10 feet with mod A of 1 person with least restrictive assistive device. Modified Target date: 08/03/15   Baseline 11/28: min A required for stability/balance but +2A required for safe advancement of RW.   Status On-going   PT LONG TERM GOAL #5   Title The patient and family will demo indep with HEP for post d/c plan.   Baseline Target date 06/08/2015. .   Status Achieved   PT LONG TERM GOAL #6   Title Pt will perform supine <> sit with supervision to indicate increased independence getting into/out of bed. Target date: 08/03/15   Baseline Min-Mod A   Status On-going   PT LONG TERM GOAL #7   Title Pt will consistently perform stand pivot transfers from w/c <> mat table with supervision using LRAD. Target date: 08/03/15   Status On-going   PT LONG TERM GOAL #8   Title Pt will consistently perform gait x75' over level, indoor surfaces with min A using LRAD to indicate increased independence with household ambulation. Target date: 08/03/15   Status On-going               Plan - 06/23/15 1553    Clinical Impression Statement  Patient limited due to decreased lateral weight shifts initially and great difficulty progressing L LE while on carpet.  Feel she needs leather toe cap on L shoe to improve ability to progress L LE on her own.  Also needs more work on standing balance and weight shifts.  Feel stretching helps some, but does not carry over into function.   Pt will benefit from skilled therapeutic intervention in order to improve on the following deficits Abnormal gait;Decreased balance;Decreased mobility;Decreased strength;Postural dysfunction;Impaired flexibility;Pain;Decreased activity tolerance;Decreased endurance;Increased muscle spasms;Difficulty walking   Clinical Impairments Affecting Rehab Potential patient private pay for services   PT Frequency 2x / week   PT Duration 8 weeks   PT Treatment/Interventions Electrical Stimulation;Gait training;Stair training;Neuromuscular re-education;Balance training;Therapeutic exercise;Therapeutic activities;Functional mobility training;Patient/family education;Wheelchair mobility training;Manual techniques;Orthotic Fit/Training   PT Next Visit Plan standing balance, weight shifts, transfers, ?schedule orthotist visit for shoe cap   Consulted and Agree with Plan of Care Patient;Family member/caregiver   Family Member Consulted grandson        Problem List Patient Active Problem List   Diagnosis Date Noted  . Seizures (HCC) 06/21/2015  . Hemorrhage, intracerebral (HCC) 02/03/2015  . Hemiparesis (HCC) 02/03/2015  . Spastic hemiplegia affecting nondominant side (HCC) 02/03/2015    Elray Mcgregorynthia Wynn 06/23/2015, 4:03 PM  Sheran Lawlessyndi Wynn, PT 06/23/2015   [facility] [address] [place], [place], [address] Phone: [phone]   Fax:  [phone]  Name: Michelle Kemp MRN: [id number] Date of Birth: 08/29/1940

## 2015-06-24 ENCOUNTER — Telehealth: Payer: Self-pay

## 2015-06-24 NOTE — Telephone Encounter (Signed)
Rn talk to ONEOKJason Nicoletta at Dr. Susanne BordersAndrew Kristens office about pt receiving Botox for her left side for weakness. Rn explain that the referral was sent 06-11-15 via epic and October 2016. Barbara CowerJason stated the first referral dd not hit the work queue because it did not have a dept name on it. He stated the second one did hit the queue this week. Barbara CowerJason stated if [facility] just wanted injections for pain or spasticity, they can just call to schedule appt with him. He stated if [facility] wanted their office to take over total follow up care, the referral would be sent via epic. Rn explain pt does not have insurance,and daughter is willing to pay. Rn also explain that patient speaks EWE and was not in the language line, and their was not a interpreter locally. Rn explain that the was the issue. Rn stated they might of found someone in the past month to speak EWE per interpreter line. Barbara CowerJason stated he understood and will call the pts daughter to schedule. Pt will be self pay.

## 2015-06-25 ENCOUNTER — Ambulatory Visit (HOSPITAL_COMMUNITY)
Admission: RE | Admit: 2015-06-25 | Discharge: 2015-06-25 | Disposition: A | Discharge status: Home / Self Care | Payer: Self-pay | Source: Ambulatory Visit | Attending: Neurology | Admitting: Neurology

## 2015-06-25 DIAGNOSIS — I610 Nontraumatic intracerebral hemorrhage in hemisphere, subcortical: Secondary | ICD-10-CM

## 2015-06-28 ENCOUNTER — Ambulatory Visit (HOSPITAL_COMMUNITY)
Admission: RE | Admit: 2015-06-28 | Discharge: 2015-06-28 | Disposition: A | Discharge status: Home / Self Care | Payer: Self-pay | Source: Ambulatory Visit | Attending: Neurology | Admitting: Neurology

## 2015-06-28 ENCOUNTER — Encounter: Payer: Self-pay | Admitting: Occupational Therapy

## 2015-06-28 ENCOUNTER — Ambulatory Visit: Payer: Self-pay | Admitting: Physical Therapy

## 2015-06-28 ENCOUNTER — Ambulatory Visit: Payer: Self-pay | Admitting: Occupational Therapy

## 2015-06-28 VITALS — BP 143/97 | HR 79

## 2015-06-28 DIAGNOSIS — G8110 Spastic hemiplegia affecting unspecified side: Secondary | ICD-10-CM

## 2015-06-28 DIAGNOSIS — I639 Cerebral infarction, unspecified: Secondary | ICD-10-CM | POA: Insufficient documentation

## 2015-06-28 DIAGNOSIS — I610 Nontraumatic intracerebral hemorrhage in hemisphere, subcortical: Secondary | ICD-10-CM

## 2015-06-28 DIAGNOSIS — M25612 Stiffness of left shoulder, not elsewhere classified: Secondary | ICD-10-CM

## 2015-06-28 DIAGNOSIS — R2681 Unsteadiness on feet: Secondary | ICD-10-CM

## 2015-06-28 DIAGNOSIS — R293 Abnormal posture: Secondary | ICD-10-CM

## 2015-06-28 DIAGNOSIS — R269 Unspecified abnormalities of gait and mobility: Secondary | ICD-10-CM

## 2015-06-28 NOTE — Therapy (Signed)
[facility] [address] [place], [place], [address] Phone: [phone]   Fax:  [phone]  Occupational Therapy Treatment  Patient Details  Name: Michelle Kemp MRN: [id number] Date of Birth: Feb 19, 1941 Referring Provider: Sarina Ill  Encounter Date: 06/28/2015      OT End of Session - 06/28/15 1545    Visit Number 26   Number of Visits 30   Date for OT Re-Evaluation 07/09/15   Authorization Type self pay   OT Start Time 1445   OT Stop Time 1531   OT Time Calculation (min) 46 min   Activity Tolerance Patient tolerated treatment well   Behavior During Therapy [facility] for tasks assessed/performed      Past Medical History  Diagnosis Date  . Stroke ([place])   . Arthritis     Past Surgical History  Procedure Laterality Date  . Hernia repair  2001    There were no vitals filed for this visit.  Visit Diagnosis:  Spastic hemiplegia affecting nondominant side (HCC)  Unsteadiness on feet  Postural instability  Stiffness of shoulder joint, left      Subjective Assessment - 06/28/15 1537    Subjective  Patient very tearful today - reporting frustration that her leg is not moving better.   Patient is accompained by: Family member   Currently in Pain? Yes   Pain Location Knee   Pain Orientation Left   Pain Descriptors / Indicators Aching   Pain Type Chronic pain   Pain Radiating Towards through calf   Pain Onset More than a month ago   Pain Frequency Intermittent   Aggravating Factors  weight bearing   Pain Relieving Factors rest                              OT Education - 06/28/15 1544    Education provided Yes   Education Details Discharge from OT next session.  Would plan to restart therapy if botox indocated   Person(s) Educated Patient;Caregiver(s)   Methods Explanation   Comprehension Verbalized understanding          OT Short Term Goals - 03/31/15 1618    OT SHORT  TERM GOAL #1   Title Patient will don pull over shirt without assistance (goals due 03/19/15)   Status Not Met   OT SHORT TERM GOAL #2   Title Patient will transition from sit to stand with mod assist as a precursos to eventually allow family to adjust clothing prior to and following toileting   Status Achieved   OT SHORT TERM GOAL #3   Title Patient will report pain no greater than 2-3/10 after positioning in bed by caregivers to allow her to sleep restfully   Status Achieved   OT SHORT TERM GOAL #4   Title Patient will reach forward toward lower legs and feet and sustaing position for 10 seconds to aide with lower body bathing and dressing.   Status Achieved   OT SHORT TERM GOAL #5   Title Patient will demonstrate active grasp and release  in left hand with minimal facilitation, to aide with meal prep tasks as a precursor to simple cooking task   Status Achieved           OT Long Term Goals - 06/23/15 1814    OT LONG TERM GOAL #4   Title Patient will be able to reposition left arm for comfort in upright and reclined positions -  such that pain experienced is no greater than 2/10   Status Achieved               Plan - 06/28/15 1546    Clinical Impression Statement Patient showing continued improvement with functional mobility, yet is limited by spasticity in left UE/LE.     Pt will benefit from skilled therapeutic intervention in order to improve on the following deficits (Retired) Decreased activity tolerance;Decreased balance;Decreased coordination;Decreased endurance;Impaired flexibility;Impaired perceived functional ability;Decreased safety awareness;Decreased range of motion;Decreased mobility;Decreased knowledge of use of DME;Decreased knowledge of precautions;Decreased strength;Increased edema;Increased muscle spasms;Impaired sensation;Pain;Impaired tone;Impaired UE functional use;Impaired vision/preception   Rehab Potential Good   Clinical Impairments Affecting Rehab  Potential limited financial resources   OT Frequency 2x / week   OT Duration 8 weeks   OT Treatment/Interventions Self-care/ADL training;Electrical Stimulation;Cryotherapy;Moist Heat;Visual/perceptual remediation/compensation;Patient/family education;Balance training;Splinting;Therapeutic exercises;Therapeutic activities;Neuromuscular education;Functional Mobility Training;Fluidtherapy;Biofeedback;DME and/or AE instruction   Plan discharge OT   Consulted and Agree with Plan of Care Patient   Family Member Consulted Daughter        Problem List Patient Active Problem List   Diagnosis Date Noted  . Seizures ([place]) 06/21/2015  . Hemorrhage, intracerebral ([place]) 02/03/2015  . Hemiparesis ([place]) 02/03/2015  . Spastic hemiplegia affecting nondominant side ([place]) 02/03/2015    Mariah Milling, OTR/L 06/29/2015, 8:27 AM  [facility] [address] [place], [place], [address] Phone: [phone]   Fax:  [phone]  Name: Michelle Kemp MRN: [id number] Date of Birth: 20-Dec-1940

## 2015-06-28 NOTE — Therapy (Signed)
[facility] [address] [place], [place], [address] Phone: [phone]   Fax:  [phone]  Physical Therapy Treatment  Patient Details  Name: Michelle Kemp MRN: [id number] Date of Birth: 02/16/1941 No Data Recorded  Encounter Date: 06/28/2015      PT End of Session - 06/28/15 1933    Visit Number 30   Number of Visits 42   Date for PT Re-Evaluation 08/07/15   Authorization Type Self pay, no eligibility to apply for benefits per daughter's reports   PT Start Time 1406   PT Stop Time 1445   PT Time Calculation (min) 39 min   Equipment Utilized During Treatment Gait belt   Activity Tolerance Patient tolerated treatment well   Behavior During Therapy [facility] for tasks assessed/performed      Past Medical History  Diagnosis Date  . Stroke (HCC)   . Arthritis     Past Surgical History  Procedure Laterality Date  . Hernia repair  2001    Filed Vitals:   06/28/15 1416  BP: 143/97  Pulse: 79    Visit Diagnosis:  Abnormality of gait  Unsteadiness on feet  Spastic hemiplegia affecting nondominant side (HCC)      Subjective Assessment - 06/28/15 1924    Subjective Grandson reports pt was "not walking at all" over the weekend. "She couldn't move her right leg at all,"per grandson.   Patient is accompained by: Family member   Pertinent History       Patient Stated Goals Improve mobility and endurance   Currently in Pain? Yes   Pain Score 4    Pain Location Knee   Pain Orientation Left   Pain Descriptors / Indicators Aching;Other (Comment)  stiffness   Pain Onset More than a month ago   Pain Frequency Intermittent   Aggravating Factors  weight bearing   Pain Relieving Factors rest   Multiple Pain Sites No                         [facility] Adult PT Treatment/Exercise - 06/28/15 0001    Bed Mobility   Bed Mobility --   Supine to Sit --   Sitting - Scoot to Edge of Bed --   Sit to  Sidelying Right --   Transfers   Transfers Sit to Stand;Stand to Sit;Stand Pivot Transfers   Sit to Stand 3: Mod assist   Sit to Stand Details Manual facilitation for weight shifting;Tactile cues for placement;Tactile cues for sequencing   Sit to Stand: Patient Percentage --   Stand to Sit 4: Min assist   Stand to Sit Details (indicate cue type and reason) --   Stand Pivot Transfers --   Ambulation/Gait   Ambulation/Gait Yes   Ambulation/Gait Assistance 4: Min guard;1: +2 Total assist  +2A for w/c follow   Ambulation Distance (Feet) 45 Feet  then x18'   Assistive device Rolling walker;Other (Comment)  L metal upright AFO; L hand orthosis   Gait Pattern Step-to pattern;Decreased hip/knee flexion - left;Decreased step length - left;Decreased step length - right;Decreased stance time - left;Trunk rotated posteriorly on left;Scissoring  LLE scissoring for initial ~5 steps   Ambulation Surface Level;Indoor   Gait Comments Gait x8', x45',  x17' (seated rest break between trials). During initial trial, pt exhibited difficulty ambulating due to L hand frequently falling off RW. Therefore, added L hand orthosis to RW to maintain hand position and increase LUE WB during gait (for  spasticity management). Pt then performed subsequent 2 gait trials requiring multimodal cueing for upright posture and min A for manual facilitation of lateral weight shift to R side to enable pt to initate LLE advancement. During initial ~5 steps of all gait trials, pt required manual redirection of LLE advancement due to excessive LLE adduction.   Exercises   Exercises Lumbar;Knee/Hip   Lumbar Exercises: Stretches   Passive Hamstring Stretch --   Lower Trunk Rotation --   Knee/Hip Exercises: Supine   Heel Slides AAROM;1 set;10 reps;Left   Other Supine Knee/Hip Exercises hip ER stretches in supine to L LE x 3 x 30 sec                  PT Short Term Goals - 06/23/15 1556    PT SHORT TERM GOAL #1   Title The  patient will tolerate standing program with family assistance x 5 minutes for improved weight bearing and assist for ADLs. Target date 05/09/2015   Baseline 11/2: Achieved, per pt report.   Status On-going   PT SHORT TERM GOAL #2   Title The patient will consistently perform sit<>supine with min A and 25% cueing to indicate increased independence with bed mobility.  Modified Target date: 07/06/15   Baseline 11/2: pt required min to mod A and 50% cueing. REVISED from CGA to min A, 25% cueing.  continue goal through renewed POC   Status On-going   PT SHORT TERM GOAL #3   Title The patient will transfer w/c<>mat with sliding board with CGA of 1 person. Target date 05/09/2015   Baseline Deferred, as pt currently performing standing pivot transfers with family.   Status Deferred   PT SHORT TERM GOAL #4   Title The patient will propel manual wheelchair with minimal verbal cues.   Baseline Deferred, as pt now ambulating with PFRW.   Status Deferred   PT SHORT TERM GOAL #5   Title The patient will ambulate in parallel bars with +2 assist x 5 feet to demo improved postural control and strength for standing tasks. Target date 05/09/2015   Baseline 11/2: Pt has consistently ambulated with min to max A using L PFRW.   Status Achieved   PT SHORT TERM GOAL #6   Title The patient will move sit <> stand with supervision with LRAD to indicate increased independence with transfers. Target date: 07/06/15.   Baseline Min A for transfers with/without PFRW.   Status On-going   PT SHORT TERM GOAL #7   Title Pt will consistently ambulate x75' over level, indoor surfaces with mod A of 1 therapist using LRAD for increased independence with household ambulation. Target date: 07/06/15   Baseline Ambulates with min A for stability/balance but requires +2A for safe RW advancement.   Status On-going           PT Long Term Goals - 06/23/15 1559    PT LONG TERM GOAL #1   Title The patient/family will verbalize  understanding of CVA warning signs/risk factors. Modified Target date: 08/03/15   Baseline 06/07/15. Pt and nephew only able to verbalize signs. Information on risk factors and warning signs reveiwed with them. Written copies provided.  continue   Status On-going   PT LONG TERM GOAL #2   Title The patient will move sit <> stand with CGA of one person.    Baseline 11/28: Pt requires min A with and without PFRW.  continue   PT LONG TERM GOAL #3   Title The  patient will transfer w/c<>mat with squat pivot or boost transfer with CGA for safety.   Baseline Goal was deferred because pt is currently doing standing pivot transfer to enter car.   Status Deferred   PT LONG TERM GOAL #4   Title The patient will ambulate x 10 feet with mod A of 1 person with least restrictive assistive device. Modified Target date: 08/03/15   Baseline 11/28: min A required for stability/balance but +2A required for safe advancement of RW.   Status On-going   PT LONG TERM GOAL #5   Title The patient and family will demo indep with HEP for post d/c plan.   Baseline Target date 06/08/2015. .   Status Achieved   PT LONG TERM GOAL #6   Title Pt will perform supine <> sit with supervision to indicate increased independence getting into/out of bed. Target date: 08/03/15   Baseline Min-Mod A   Status On-going   PT LONG TERM GOAL #7   Title Pt will consistently perform stand pivot transfers from w/c <> mat table with supervision using LRAD. Target date: 08/03/15   Status On-going   PT LONG TERM GOAL #8   Title Pt will consistently perform gait x75' over level, indoor surfaces with min A using LRAD to indicate increased independence with household ambulation. Target date: 08/03/15   Status On-going               Plan - 06/28/15 1934    Clinical Impression Statement Session focused on gait training using L hand orthosis. Longest gait trial was 17' requiring only min A for lateral weight shift to R side. Pt able to  consistently advance LLE when manuual weight shift provided.    Pt will benefit from skilled therapeutic intervention in order to improve on the following deficits Abnormal gait;Decreased balance;Decreased mobility;Decreased strength;Postural dysfunction;Impaired flexibility;Pain;Decreased activity tolerance;Decreased endurance;Increased muscle spasms;Difficulty walking   Rehab Potential Good   Clinical Impairments Affecting Rehab Potential patient private pay for services   PT Frequency 2x / week   PT Duration 8 weeks   PT Treatment/Interventions Electrical Stimulation;Gait training;Stair training;Neuromuscular re-education;Balance training;Therapeutic exercise;Therapeutic activities;Functional mobility training;Patient/family education;Wheelchair mobility training;Manual techniques;Orthotic Fit/Training   PT Next Visit Plan lateral weight shift to R side, transfers, standing balance, may consider toe cap in future.   Consulted and Agree with Plan of Care Patient;Family member/caregiver   Family Member Consulted grandson        Problem List Patient Active Problem List   Diagnosis Date Noted  . Seizures (HCC) 06/21/2015  . Hemorrhage, intracerebral (HCC) 02/03/2015  . Hemiparesis (HCC) 02/03/2015  . Spastic hemiplegia affecting nondominant side (HCC) 02/03/2015    Jorje Guild, PT, DPT [facility] [address] [place], [place], [address] Phone: [phone]   Fax:  [phone] 06/28/2015, 7:37 PM   Name: Michelle Kemp MRN: [id number] Date of Birth: April 27, 1941

## 2015-06-28 NOTE — Progress Notes (Signed)
VASCULAR LAB PRELIMINARY  PRELIMINARY  PRELIMINARY  PRELIMINARY  Carotid duplex completed.    Preliminary report:  Bilateral:  1-39% ICA stenosis.  Vertebral artery flow is antegrade.      Berthe Oley, RVT 06/28/2015, 4:33 PM

## 2015-06-30 ENCOUNTER — Ambulatory Visit: Payer: Self-pay | Admitting: Occupational Therapy

## 2015-06-30 ENCOUNTER — Encounter: Payer: Self-pay | Admitting: Physical Therapy

## 2015-06-30 ENCOUNTER — Ambulatory Visit: Payer: Self-pay | Admitting: Physical Therapy

## 2015-06-30 ENCOUNTER — Encounter: Payer: Self-pay | Admitting: Occupational Therapy

## 2015-06-30 DIAGNOSIS — R2681 Unsteadiness on feet: Secondary | ICD-10-CM

## 2015-06-30 DIAGNOSIS — G8110 Spastic hemiplegia affecting unspecified side: Secondary | ICD-10-CM

## 2015-06-30 DIAGNOSIS — R293 Abnormal posture: Secondary | ICD-10-CM

## 2015-06-30 DIAGNOSIS — R269 Unspecified abnormalities of gait and mobility: Secondary | ICD-10-CM

## 2015-06-30 DIAGNOSIS — M25612 Stiffness of left shoulder, not elsewhere classified: Secondary | ICD-10-CM

## 2015-06-30 DIAGNOSIS — M6281 Muscle weakness (generalized): Secondary | ICD-10-CM

## 2015-06-30 NOTE — Therapy (Signed)
[facility] [address] [place], [place], [address] Phone: [phone]   Fax:  [phone]  Occupational Therapy Treatment  Patient Details  Name: Michelle Kemp MRN: [id number] Date of Birth: [birth year] Referring Provider: Sarina Ill  Encounter Date: 06/30/2015      OT End of Session - 06/30/15 1643    Visit Number 27   Number of Visits 30   Date for OT Re-Evaluation 07/09/15   Authorization Type self pay   OT Start Time 9371  discharge visit   OT Stop Time 1600   OT Time Calculation (min) 25 min   Activity Tolerance Patient tolerated treatment well   Behavior During Therapy [facility] for tasks assessed/performed      Past Medical History  Diagnosis Date  . Stroke ([place])   . Arthritis     Past Surgical History  Procedure Laterality Date  . Hernia repair  2001    There were no vitals filed for this visit.  Visit Diagnosis:  Spastic hemiplegia affecting nondominant side (HCC)  Stiffness of shoulder joint, left  Postural instability  Unsteadiness on feet      Subjective Assessment - 06/30/15 1539    Subjective  Patient indicates that she was able to hold her great grandson and give him a bottle   Patient is accompained by: Family member   Currently in Pain? Yes   Pain Score 4    Pain Location Leg   Pain Orientation Left   Pain Descriptors / Indicators Aching                      OT Treatments/Exercises (OP) - 06/30/15 1639    ADLs   UB Dressing Patient reports that with loose fitting shirt she is able to put on pull over shirt without assistance.  This has not been seen in the clinic as she has been dressed in tighter fitting sweaters.     LB Dressing Patient indicates that she is helping much more at home with pulling up her pants and brief.  She shows significant improvement in stand balance and decreased reliance on UE's for standing.  Patient unable to put clothing on over  feet.     Neurological Re-education Exercises   Other Exercises 1 Neuromuscular reeducation to address sit to stand and steping invarious directions managing weight shifting to right with only intermittent assistance.                  OT Education - 06/30/15 1642    Education provided Yes   Education Details Reviewed remaining goals, and plan for discharge.   Person(s) Educated Patient;Caregiver(s)   Methods Explanation   Comprehension Verbalized understanding          OT Short Term Goals - 06/30/15 1644    OT SHORT TERM GOAL #1   Title Patient will don pull over shirt without assistance (goals due 03/19/15)   Status Partially Met   OT SHORT TERM GOAL #2   Title Patient will transition from sit to stand with mod assist as a precursos to eventually allow family to adjust clothing prior to and following toileting   Status Achieved   OT SHORT TERM GOAL #3   Title Patient will report pain no greater than 2-3/10 after positioning in bed by caregivers to allow her to sleep restfully   Status Achieved   OT SHORT TERM GOAL #4   Title Patient will reach forward toward lower legs and feet  and sustaing position for 10 seconds to aide with lower body bathing and dressing.   Status Achieved   OT SHORT TERM GOAL #5   Title Patient will demonstrate active grasp and release  in left hand with minimal facilitation, to aide with meal prep tasks as a precursor to simple cooking task   Status Achieved           OT Long Term Goals - 06/30/15 1644    OT LONG TERM GOAL #1   Title Patient will don lower body clothing with moderate assistance while seated, adn transition to standing to pull up clothing (goals due 10/7)   Status Not Met   OT LONG TERM GOAL #2   Title Patient will transfer to commode with moderate assistance for toileting   Status Achieved   OT LONG TERM GOAL #3   Title Patient will stand with no greater than min assist to allow family to complete toilet hygiene    Status  Achieved   OT LONG TERM GOAL #4   Title Patient will be able to reposition left arm for comfort in upright and reclined positions - such that pain experienced is no greater than 2/10   Status Achieved   OT LONG TERM GOAL #5   Title Patient will prepare simple meal with moderate assistance from family members   Status Not Met               Plan - 06/30/15 1643    Clinical Impression Statement Discharge from OT with intent to restart after botox injection.     Pt will benefit from skilled therapeutic intervention in order to improve on the following deficits (Retired) Decreased activity tolerance;Decreased balance;Decreased coordination;Decreased endurance;Impaired flexibility;Impaired perceived functional ability;Decreased safety awareness;Decreased range of motion;Decreased mobility;Decreased knowledge of use of DME;Decreased knowledge of precautions;Decreased strength;Increased edema;Increased muscle spasms;Impaired sensation;Pain;Impaired tone;Impaired UE functional use;Impaired vision/preception   Rehab Potential Good   Clinical Impairments Affecting Rehab Potential limited financial resources   OT Frequency 2x / week   OT Duration 8 weeks   OT Treatment/Interventions Self-care/ADL training;Electrical Stimulation;Cryotherapy;Moist Heat;Visual/perceptual remediation/compensation;Patient/family education;Balance training;Splinting;Therapeutic exercises;Therapeutic activities;Neuromuscular education;Functional Mobility Training;Fluidtherapy;Biofeedback;DME and/or AE instruction   Consulted and Agree with Plan of Care Patient        Problem List Patient Active Problem List   Diagnosis Date Noted  . Seizures ([place]) 06/21/2015  . Hemorrhage, intracerebral ([place]) 02/03/2015  . Hemiparesis ([place]) 02/03/2015  . Spastic hemiplegia affecting nondominant side ([place]) 02/03/2015   OCCUPATIONAL THERAPY DISCHARGE SUMMARY  Visits from Start of Care: 27  Current functional level related to  goals / functional outcomes: Mod assist (versus dependent) with ADL   Remaining deficits: Spasticity, perceptual / cognitive deficit  Education / Equipment: HEP Plan: Patient agrees to discharge.  Patient goals were met. Patient is being discharged due to meeting the stated rehab goals.  ?????       Mariah Milling, OTR/L 06/30/2015, 4:46 PM  [facility] [address] [place], [place], [address] Phone: [phone]   Fax:  [phone]  Name: Michelle Kemp MRN: [id number] Date of Birth: [birth year]

## 2015-07-02 NOTE — Therapy (Signed)
[facility] [address] [place], [place], [address] Phone: [phone]   Fax:  [phone]  Physical Therapy Treatment  Patient Details  Name: Michelle Kemp MRN: [id number] Date of Birth: 1940/11/24 No Data Recorded  Encounter Date: 06/30/2015   06/30/15 1454  PT Visits / Re-Eval  Visit Number 31  Number of Visits 42  Date for PT Re-Evaluation 08/07/15  Authorization  Authorization Type Self pay, no eligibility to apply for benefits per daughter's reports  PT Time Calculation  PT Start Time 1450  PT Stop Time 1530  PT Time Calculation (min) 40 min  PT - End of Session  Equipment Utilized During Treatment Gait belt  Activity Tolerance Patient tolerated treatment well  Behavior During Therapy [facility] for tasks assessed/performed     Past Medical History  Diagnosis Date  . Stroke ([place])   . Arthritis     Past Surgical History  Procedure Laterality Date  . Hernia repair  2001    There were no vitals filed for this visit.  Visit Diagnosis:  Abnormality of gait  Unsteadiness on feet  Spastic hemiplegia affecting nondominant side (HCC)  Postural instability  Muscle weakness     06/30/15 1459  Symptoms/Limitations  Subjective Nephew present to translate. No falls to report today. Walking some over the weekend with family assistance. Going to have botox on January 16th.   Pain Assessment  Currently in Pain? Yes  Pain Score 4  Pain Location Leg  Pain Orientation Left  Pain Descriptors / Indicators Sore;Aching (stiffness)  Pain Type Chronic pain  Pain Radiating Towards from knee down along shin  Pain Onset More than a month ago  Pain Frequency Intermittent  Aggravating Factors  weight bearing, movement  Pain Relieving Factors rest      06/30/15 1455  Bed Mobility  Supine to Sit 4: Min guard;5: Supervision;HOB flat  Sitting - Scoot to Edge of Bed 5: Supervision  Sit to Supine 4: Min assist;4:  Min guard;HOB flat (min assist needed at end to clear bed surface with left leg )  Transfers  Transfers Sit to Stand;Stand to [employer] Transfers  Sit to Stand 3: Mod assist  Sit to Stand Details Verbal cues for technique;Verbal cues for sequencing;Verbal cues for safe use of DME/AE;Verbal cues for precautions/safety;Manual facilitation for placement;Manual facilitation for weight shifting  Sit to Stand Details (indicate cue type and reason) multimodal cues to scoot to edge of mat and on sequencing/technique  Stand to Sit 4: Min assist  Stand to Sit Details (indicate cue type and reason) Visual cues for safe use of DME/AE  Stand to Sit Details cues to turn to entire surface before sitting down, cues on hand placment, technique and sequencing.              Stand Pivot Transfers 3: Mod assist  Stand Pivot Transfer Details (indicate cue type and reason) wheelchair to mat with multimodal cues on posture and sequencing. Pt taking 3-4 pivotal steps with right > left leg advancement  Ambulation/Gait  Ambulation/Gait Yes  Ambulation/Gait Assistance 4: Min guard;4: Min assist;Other (comment) (plus wheelchair follow)  Ambulation/Gait Assistance Details utilized simulated leather toe cap with gait todayd for increased ease in advancing her left leg; cues on posture, step length, walker position and on weight shifting with gait.  Ambulation Distance (Feet) 18 Feet (x1, 42 x1)  Assistive device Rolling walker (lt metal upright brace both times;left RW orthosis on 2cd)  Gait Pattern Step-to pattern;Decreased hip/knee flexion - left;Decreased step length - left;Decreased step length - right;Decreased stance time - left;Trunk rotated posteriorly on left;Scissoring;Trunk flexed;Narrow base of support  Ambulation Surface Level;Indoor            PT Short Term Goals - 06/23/15 1556    PT SHORT TERM GOAL #1   Title The patient will tolerate standing program with family  assistance x 5 minutes for improved weight bearing and assist for ADLs. Target date 05/09/2015   Baseline 11/2: Achieved, per pt report.   Status On-going   PT SHORT TERM GOAL #2   Title The patient will consistently perform sit<>supine with min A and 25% cueing to indicate increased independence with bed mobility.  Modified Target date: 07/06/15   Baseline 11/2: pt required min to mod A and 50% cueing. REVISED from CGA to min A, 25% cueing.  continue goal through renewed POC   Status On-going   PT SHORT TERM GOAL #3   Title The patient will transfer w/c<>mat with sliding board with CGA of 1 person. Target date 05/09/2015   Baseline Deferred, as pt currently performing standing pivot transfers with family.   Status Deferred   PT SHORT TERM GOAL #4   Title The patient will propel manual wheelchair with minimal verbal cues.   Baseline Deferred, as pt now ambulating with PFRW.   Status Deferred   PT SHORT TERM GOAL #5   Title The patient will ambulate in parallel bars with +2 assist x 5 feet to demo improved postural control and strength for standing tasks. Target date 05/09/2015   Baseline 11/2: Pt has consistently ambulated with min to max A using L PFRW.   Status Achieved   PT SHORT TERM GOAL #6   Title The patient will move sit <> stand with supervision with LRAD to indicate increased independence with transfers. Target date: 07/06/15.   Baseline Min A for transfers with/without PFRW.   Status On-going   PT SHORT TERM GOAL #7   Title Pt will consistently ambulate x75' over level, indoor surfaces with mod A of 1 therapist using LRAD for increased independence with household ambulation. Target date: 07/06/15   Baseline Ambulates with min A for stability/balance but requires +2A for safe RW advancement.   Status On-going          PT Long Term Goals - 06/30/15 1507    PT LONG TERM GOAL #1   Title The patient/family will verbalize understanding of CVA warning signs/risk factors.  Modified Target date: 08/03/15   Baseline 06/30/15: nephew able to verbalize risk factors and warning signs  continue   Status Achieved   PT LONG TERM GOAL #2   Title The patient will move sit <> stand with CGA of one person.    Baseline 11/28: Pt requires min A with and without AD.  continue   Status Not Met   PT LONG TERM GOAL #3   Title The patient will transfer w/c<>mat with squat pivot or boost transfer with CGA for safety.   Baseline Goal was deferred because pt is currently doing standing pivot transfer to enter car.   Status Deferred   PT LONG TERM GOAL #4   Title The patient will ambulate x 10 feet with mod A of 1 person with least restrictive assistive device. Modified Target date: 08/03/15  Baseline 11/28: min A required for stability/balance but +2A required for safe advancement of RW.   Status Not Met   PT LONG TERM GOAL #5   Title The patient and family will demo indep with HEP for post d/c plan.   Baseline Target date 06/08/2015. .   Status Achieved   PT LONG TERM GOAL #6   Title Pt will perform supine <> sit with supervision to indicate increased independence getting into/out of bed. Target date: 08/03/15   Baseline 06/30/15: needs up to min assist currently   Status Not Met   PT LONG TERM GOAL #7   Title Pt will consistently perform stand pivot transfers from w/c <> mat table with supervision using LRAD. Target date: 08/03/15   Baseline 06/30/15: needs up to min assist currently   Status Not Met   PT LONG TERM GOAL #8   Title Pt will consistently perform gait x75' over level, indoor surfaces with min A using LRAD to indicate increased independence with household ambulation. Target date: 08/03/15   Baseline 06/30/15: pt ambulating short distances (~20 feet ) currently at home with family assistance per nephew report   Status Not Met       06/30/15 1454  Plan  Clinical Impression Statement Pt is to recieve botox in a few weeks. Goals checked so as to discharge at this  time with plans for pt to return 3 weeks after botox. Pt demo'd progress toward LTGs with some met.   Pt will benefit from skilled therapeutic intervention in order to improve on the following deficits Abnormal gait;Decreased balance;Decreased mobility;Decreased strength;Postural dysfunction;Impaired flexibility;Pain;Decreased activity tolerance;Decreased endurance;Increased muscle spasms;Difficulty walking  Rehab Potential Good  Clinical Impairments Affecting Rehab Potential patient private pay for services  PT Frequency 2x / week  PT Duration 8 weeks  PT Treatment/Interventions Electrical Stimulation;Gait training;Stair training;Neuromuscular re-education;Balance training;Therapeutic exercise;Therapeutic activities;Functional mobility training;Patient/family education;Wheelchair mobility training;Manual techniques;Orthotic Fit/Training  PT Next Visit Plan discharge today, pt to return 3 weeks after botox injections  Consulted and Agree with Plan of Care Patient;Family member/caregiver  Family Member Consulted nephew    Problem List Patient Active Problem List   Diagnosis Date Noted  . Seizures ([place]) 06/21/2015  . 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 Hemiparesis ([place]) 02/03/2015  . Spastic hemiplegia affecting nondominant side ([place]) 02/03/2015    Willow Ora 07/02/2015, 12:57 AM  Willow Ora, PTA, [facility] [address] [place], [place] [address] [phone] 07/02/2015, 12:57 AM   Name: Jeselle Hiser MRN: [id number] Date of Birth: 1941-04-24

## 2015-07-07 ENCOUNTER — Ambulatory Visit: Payer: Self-pay | Admitting: Physical Therapy

## 2015-07-09 ENCOUNTER — Ambulatory Visit: Payer: Self-pay | Admitting: Physical Therapy

## 2015-07-09 ENCOUNTER — Encounter: Payer: Self-pay | Admitting: Occupational Therapy

## 2015-07-13 ENCOUNTER — Telehealth: Payer: Self-pay

## 2015-07-13 ENCOUNTER — Telehealth: Payer: Self-pay | Admitting: *Deleted

## 2015-07-13 NOTE — Telephone Encounter (Signed)
-----   Message from Marvel PlanJindong Xu, MD sent at 07/12/2015  7:21 AM EST ----- Could you please let the patient know that the carotid doppler test done recently in the hospital was normal. Please continue current treatment. Thanks.  Marvel PlanJindong Xu, MD PhD Stroke Neurology 07/12/2015 7:21 AM

## 2015-07-13 NOTE — Telephone Encounter (Signed)
-----   Message from Jindong Xu, MD sent at 07/12/2015  7:21 AM EST ----- Could you please let the patient know that the carotid doppler test done recently in the hospital was normal. Please continue current treatment. Thanks.  Jindong Xu, MD PhD Stroke Neurology 07/12/2015 7:21 AM     

## 2015-07-13 NOTE — Telephone Encounter (Signed)
LFt vm for patients daughter on DPR lsit about her mom carotid doppler test.

## 2015-07-14 NOTE — Telephone Encounter (Signed)
Daughter Raynelle FanningJulie returned Katrina's call, please call back to [phone].

## 2015-07-16 NOTE — Telephone Encounter (Signed)
Rn call patients daughter to let her know that the carotid doppler for her mom was normal. Pts daughter verbalized understanding.

## 2015-07-19 ENCOUNTER — Other Ambulatory Visit: Payer: Self-pay

## 2015-07-19 ENCOUNTER — Telehealth: Payer: Self-pay | Admitting: Adult Health

## 2015-07-19 NOTE — Telephone Encounter (Signed)
I called patient. Spoke to a family member. Advised that [facility] would be closed due to weather,

## 2015-07-20 ENCOUNTER — Encounter: Payer: Self-pay | Admitting: Physical Therapy

## 2015-07-20 NOTE — Therapy (Signed)
[facility] [address] [place], [place], [address] Phone: [phone]   Fax:  [phone]  Patient Details  Name: Michelle Kemp MRN: [id number] Date of Birth: [birth year] Referring Provider:  No ref. provider found  Encounter Date: 07/20/2015  PHYSICAL THERAPY DISCHARGE SUMMARY  Visits from Start of Care: 31   Current functional level related to goals / functional outcomes:     PT Long Term Goals - 06/30/15 1507    PT LONG TERM GOAL #1   Title The patient/family will verbalize understanding of CVA warning signs/risk factors. Modified Target date: 08/03/15   Baseline 06/30/15: nephew able to verbalize risk factors and warning signs  continue   Status Achieved   PT LONG TERM GOAL #2   Title The patient will move sit <> stand with CGA of one person.    Baseline 11/28: Pt requires min A with and without AD.  continue   Status Not Met   PT LONG TERM GOAL #3   Title The patient will transfer w/c<>mat with squat pivot or boost transfer with CGA for safety.   Baseline Goal was deferred because pt is currently doing standing pivot transfer to enter car.   Status Deferred   PT LONG TERM GOAL #4   Title The patient will ambulate x 10 feet with mod A of 1 person with least restrictive assistive device. Modified Target date: 08/03/15   Baseline 11/28: min A required for stability/balance but +2A required for safe advancement of RW.   Status Not Met   PT LONG TERM GOAL #5   Title The patient and family will demo indep with HEP for post d/c plan.   Baseline Target date 06/08/2015. .   Status Achieved   PT LONG TERM GOAL #6   Title Pt will perform supine <> sit with supervision to indicate increased independence getting into/out of bed. Target date: 08/03/15   Baseline 06/30/15: needs up to min assist currently   Status Not Met   PT LONG TERM GOAL #7   Title Pt will consistently perform stand pivot transfers from w/c <> mat  table with supervision using LRAD. Target date: 08/03/15   Baseline 06/30/15: needs up to min assist currently   Status Not Met   PT LONG TERM GOAL #8   Title Pt will consistently perform gait x75' over level, indoor surfaces with min A using LRAD to indicate increased independence with household ambulation. Target date: 08/03/15   Baseline 06/30/15: pt ambulating short distances (~20 feet ) currently at home with family assistance per nephew report   Status Not Met        Remaining deficits: See LTG's and statuses above. Lack of progress toward LTG's attributable to POC ended earlier than anticipated, as patient planning to receive botox injections. Plan to perform PT evaluation following botox.    Education / Equipment: HEP; home standing program. CVA warning signs and pertinent risk factors. AFO (rationale, donning, doffing). Plan: Patient agrees to discharge.  Patient goals were partially met.                                                                                                      ?????  Patient discharged from outpatient PT at this time due to pt plan to receive botox injections. Will plan to resume outpatient PT approximately 3 weeks after botox injections received. Patient and family verbalized understanding and were in full agreement with DC plan.  Billie Ruddy, PT, DPT [facility] [address] [place], [place], [address] Phone: [phone]   Fax:  [phone] 07/20/2015, 10:07 AM

## 2015-07-22 ENCOUNTER — Other Ambulatory Visit: Payer: Self-pay

## 2015-07-23 ENCOUNTER — Encounter: Payer: Self-pay | Attending: Physical Medicine & Rehabilitation

## 2015-07-23 ENCOUNTER — Encounter: Payer: Self-pay | Admitting: Physical Medicine & Rehabilitation

## 2015-07-23 ENCOUNTER — Ambulatory Visit (HOSPITAL_BASED_OUTPATIENT_CLINIC_OR_DEPARTMENT_OTHER): Payer: Self-pay | Admitting: Physical Medicine & Rehabilitation

## 2015-07-23 VITALS — BP 145/86 | HR 77 | Resp 14

## 2015-07-23 DIAGNOSIS — G8110 Spastic hemiplegia affecting unspecified side: Secondary | ICD-10-CM | POA: Insufficient documentation

## 2015-07-23 DIAGNOSIS — Z7982 Long term (current) use of aspirin: Secondary | ICD-10-CM | POA: Insufficient documentation

## 2015-07-23 NOTE — Patient Instructions (Signed)

## 2015-07-23 NOTE — Progress Notes (Signed)
  Botox Injection for spasticity using needle EMG guidance  Dilution: 50 Units/ml Indication: Severe spasticity which interferes with ADL,mobility and/or  hygiene and is unresponsive to medication management and other conservative care Informed consent was obtained after describing risks and benefits of the procedure with the patient. This includes bleeding, bruising, infection, excessive weakness, or medication side effects. A REMS form is on file and signed. Needle: 50mm 25g needle electrode Number of units per muscle Trunk Pectoralis100  Left upper ext  FCR50  FDS50 FDP50 FPL25 Pronator25  Left Lower ext VMO 25 U Rectus fem 75U  All injections were done after obtaining appropriate EMG activity and after negative drawback for blood. The VMO was relatively quiet therefore would increase dose of the rectus femoris at next visit to 100 unitsThe patient tolerated the procedure well. Post procedure instructions were given. A followup appointment was made.   Because patient had the maximum number of units of Botox injected today. We'll need to do the left Mus cutaneous nerve block with phenol to reduce elbow flexor spasticity. Will be scheduled in 3-4 weeks

## 2015-07-26 ENCOUNTER — Telehealth: Payer: Self-pay

## 2015-07-26 NOTE — Telephone Encounter (Signed)
Rn call patients PCP office Dr.George Bonsu Osei office [facility]. Rn explain Dr.Xu from [facility] wanted her last labs at the office. [facility]

## 2015-08-02 ENCOUNTER — Telehealth: Payer: Self-pay | Admitting: Neurology

## 2015-08-02 NOTE — Telephone Encounter (Signed)
Most recent lab received from PCP office:  04/27/15  A1C 4.7 Total Choles 193 LDL 107 HDL 71 TG 75 Cre 0.82

## 2015-08-13 ENCOUNTER — Ambulatory Visit: Payer: Self-pay | Admitting: Physical Medicine & Rehabilitation

## 2015-08-17 ENCOUNTER — Other Ambulatory Visit: Payer: Self-pay

## 2015-08-17 ENCOUNTER — Ambulatory Visit: Payer: Self-pay | Admitting: Physical Therapy

## 2015-08-17 ENCOUNTER — Ambulatory Visit: Payer: Self-pay | Admitting: *Deleted

## 2015-08-18 ENCOUNTER — Ambulatory Visit (INDEPENDENT_AMBULATORY_CARE_PROVIDER_SITE_OTHER): Payer: Self-pay | Admitting: Neurology

## 2015-08-18 ENCOUNTER — Other Ambulatory Visit: Payer: Self-pay

## 2015-08-18 DIAGNOSIS — R569 Unspecified convulsions: Secondary | ICD-10-CM

## 2015-08-18 NOTE — Procedures (Signed)
    History:  Michelle Kemp is a 75 year old gentleman with a history of a right frontal intracranial hemorrhage that occurred on 11/07/2014 with associated left-sided weakness. Since this event, the patient has had episodes of left facial and left arm and leg twitching and jerking. These events are not associated with bowel or bladder incontinence or loss of consciousness. The patient is being evaluated for possible seizures.  This is a routine EEG. No skull defects are noted. Medications include aspirin, baclofen, vitamin B12 supplementation, fish oil, hydrocodone, Keppra, and vitamin B6.   EEG classification: Normal awake  Description of the recording: The background rhythms of this recording consists of a fairly well modulated medium amplitude alpha rhythm of 9 Hz that is reactive to eye opening and closure. As the record progresses, the patient appears to remain in the waking state throughout the recording. Photic stimulation was performed, resulting in a bilateral and symmetric photic driving response. Hyperventilation was not performed. At no time during the recording does there appear to be evidence of spike or spike wave discharges or evidence of focal slowing. EKG monitor shows no evidence of cardiac rhythm abnormalities with a heart rate of 72.  Impression: This is a normal EEG recording in the waking state. No evidence of ictal or interictal discharges are seen.

## 2015-08-19 ENCOUNTER — Telehealth: Payer: Self-pay

## 2015-08-19 NOTE — Telephone Encounter (Signed)
-----   Message from Marvel Plan, MD sent at 08/18/2015 11:39 PM EST ----- Could you please let the patient know that the EEG test done recently in our office was normal EEG. Please continue current treatment. Thanks.  Marvel Plan, MD PhD Stroke Neurology 08/18/2015 11:39 PM

## 2015-08-19 NOTE — Telephone Encounter (Signed)
LFt vm for patients daughter [name] who is on DPR form about her moms EEG results.

## 2015-08-23 ENCOUNTER — Encounter: Payer: Self-pay | Admitting: Occupational Therapy

## 2015-08-23 ENCOUNTER — Ambulatory Visit: Payer: Self-pay | Admitting: Physical Therapy

## 2015-08-23 NOTE — Telephone Encounter (Signed)
Rn call patients daughter Amil Amen EEG results. Pt does not speak [place]. Rn stated her moms EEG was normal and to continue treatment plan

## 2015-08-24 ENCOUNTER — Encounter: Payer: Self-pay | Attending: Physical Medicine & Rehabilitation

## 2015-08-24 ENCOUNTER — Encounter: Payer: Self-pay | Admitting: Physical Medicine & Rehabilitation

## 2015-08-24 ENCOUNTER — Ambulatory Visit (HOSPITAL_BASED_OUTPATIENT_CLINIC_OR_DEPARTMENT_OTHER): Payer: Self-pay | Admitting: Physical Medicine & Rehabilitation

## 2015-08-24 VITALS — BP 158/96 | HR 80

## 2015-08-24 DIAGNOSIS — Z7982 Long term (current) use of aspirin: Secondary | ICD-10-CM | POA: Insufficient documentation

## 2015-08-24 DIAGNOSIS — G8110 Spastic hemiplegia affecting unspecified side: Secondary | ICD-10-CM | POA: Insufficient documentation

## 2015-08-24 NOTE — Progress Notes (Signed)
Phenol neurolysis of the Left musculo- cutaneous nerve  Indication: Severe spasticity in the arm flexor muscles which is not responding to medical management and other conservative care and interfering with functional use and hygiene.  Informed consent was obtained after describing the risks and benefits of the procedure with the patient this includes bleeding bruising and infection as well as medication side effects. The patient elected to proceed and has given written consent. Patient placed in a supine position on the exam table. External DC stimulation was applied to the axilla using a nerve stimulator. Arm flexion twitch was obtained. The axillary region was prepped with Betadine and then entered with a 22-gauge 40 mm needle electrode under electrical stimulation guidance. Arm flexion which was obtained and confirmed. Then 4 cc of 5% phenol were injected. The patient tolerated procedure well. Post procedure instructions and followup visit were given. 

## 2015-08-24 NOTE — Patient Instructions (Addendum)
L musculocutaneous nerve block with phenol today. This medication may start taking the fact today however full effect will be at about one week Duration of the effect is 3-6 months Side effects of medication may include left forearm numbness or burning. Call if you have burning pain so we can recommend any medication for that.

## 2015-08-25 ENCOUNTER — Ambulatory Visit: Payer: Self-pay | Admitting: Physical Therapy

## 2015-08-25 ENCOUNTER — Encounter: Payer: Self-pay | Admitting: Occupational Therapy

## 2015-08-30 ENCOUNTER — Other Ambulatory Visit: Payer: Self-pay

## 2015-08-30 ENCOUNTER — Ambulatory Visit: Payer: Self-pay | Admitting: Physical Therapy

## 2015-08-30 ENCOUNTER — Encounter: Payer: Self-pay | Admitting: Occupational Therapy

## 2015-08-30 DIAGNOSIS — G8110 Spastic hemiplegia affecting unspecified side: Secondary | ICD-10-CM

## 2015-09-01 ENCOUNTER — Encounter: Payer: Self-pay | Admitting: Occupational Therapy

## 2015-09-01 ENCOUNTER — Ambulatory Visit: Payer: Self-pay | Admitting: Physical Therapy

## 2015-09-02 ENCOUNTER — Encounter: Payer: Self-pay | Admitting: Occupational Therapy

## 2015-09-02 ENCOUNTER — Ambulatory Visit: Payer: Self-pay | Attending: Neurology | Admitting: Occupational Therapy

## 2015-09-02 DIAGNOSIS — R2681 Unsteadiness on feet: Secondary | ICD-10-CM | POA: Insufficient documentation

## 2015-09-02 DIAGNOSIS — G8110 Spastic hemiplegia affecting unspecified side: Secondary | ICD-10-CM | POA: Insufficient documentation

## 2015-09-02 DIAGNOSIS — R293 Abnormal posture: Secondary | ICD-10-CM | POA: Insufficient documentation

## 2015-09-02 DIAGNOSIS — M25612 Stiffness of left shoulder, not elsewhere classified: Secondary | ICD-10-CM | POA: Insufficient documentation

## 2015-09-02 DIAGNOSIS — M25512 Pain in left shoulder: Secondary | ICD-10-CM | POA: Insufficient documentation

## 2015-09-02 DIAGNOSIS — M25522 Pain in left elbow: Secondary | ICD-10-CM | POA: Insufficient documentation

## 2015-09-04 ENCOUNTER — Encounter (HOSPITAL_COMMUNITY): Payer: Self-pay | Admitting: Emergency Medicine

## 2015-09-04 ENCOUNTER — Emergency Department (HOSPITAL_COMMUNITY): Payer: Self-pay

## 2015-09-04 ENCOUNTER — Emergency Department (HOSPITAL_COMMUNITY)
Admission: EM | Admit: 2015-09-04 | Discharge: 2015-09-04 | Disposition: A | Discharge status: Home / Self Care | Payer: Self-pay | Attending: Emergency Medicine | Admitting: Emergency Medicine

## 2015-09-04 DIAGNOSIS — R569 Unspecified convulsions: Secondary | ICD-10-CM | POA: Insufficient documentation

## 2015-09-04 DIAGNOSIS — J189 Pneumonia, unspecified organism: Secondary | ICD-10-CM

## 2015-09-04 DIAGNOSIS — Z79899 Other long term (current) drug therapy: Secondary | ICD-10-CM | POA: Insufficient documentation

## 2015-09-04 DIAGNOSIS — Z8673 Personal history of transient ischemic attack (TIA), and cerebral infarction without residual deficits: Secondary | ICD-10-CM | POA: Insufficient documentation

## 2015-09-04 DIAGNOSIS — Z7982 Long term (current) use of aspirin: Secondary | ICD-10-CM | POA: Insufficient documentation

## 2015-09-04 DIAGNOSIS — M1990 Unspecified osteoarthritis, unspecified site: Secondary | ICD-10-CM | POA: Insufficient documentation

## 2015-09-04 DIAGNOSIS — Z7951 Long term (current) use of inhaled steroids: Secondary | ICD-10-CM | POA: Insufficient documentation

## 2015-09-04 DIAGNOSIS — J159 Unspecified bacterial pneumonia: Secondary | ICD-10-CM | POA: Insufficient documentation

## 2015-09-04 HISTORY — DX: Unspecified convulsions: R56.9

## 2015-09-04 LAB — URINALYSIS, ROUTINE W REFLEX MICROSCOPIC
BILIRUBIN URINE: NEGATIVE
GLUCOSE, UA: NEGATIVE mg/dL
HGB URINE DIPSTICK: NEGATIVE
KETONES UR: NEGATIVE mg/dL
Leukocytes, UA: NEGATIVE
Nitrite: NEGATIVE
PROTEIN: NEGATIVE mg/dL
Specific Gravity, Urine: 1.012 (ref 1.005–1.030)
pH: 6.5 (ref 5.0–8.0)

## 2015-09-04 LAB — BASIC METABOLIC PANEL
Anion gap: 12 (ref 5–15)
BUN: 10 mg/dL (ref 6–20)
CALCIUM: 9.4 mg/dL (ref 8.9–10.3)
CO2: 24 mmol/L (ref 22–32)
CREATININE: 0.89 mg/dL (ref 0.44–1.00)
Chloride: 101 mmol/L (ref 101–111)
GFR calc Af Amer: >60 mL/min (ref >60)
Glucose, Bld: 100 mg/dL — ABNORMAL HIGH (ref 65–99)
POTASSIUM: 3.8 mmol/L (ref 3.5–5.1)
SODIUM: 137 mmol/L (ref 135–145)

## 2015-09-04 LAB — CBC
HCT: 36.2 % (ref 36.0–46.0)
Hemoglobin: 13.7 g/dL (ref 12.0–15.0)
MCH: 32 pg (ref 26.0–34.0)
MCHC: 37.8 g/dL — AB (ref 30.0–36.0)
MCV: 84.6 fL (ref 78.0–100.0)
PLATELETS: 210 10*3/uL (ref 150–400)
RBC: 4.28 MIL/uL (ref 3.87–5.11)
RDW: 14.2 % (ref 11.5–15.5)
WBC: 6.6 10*3/uL (ref 4.0–10.5)

## 2015-09-04 LAB — CBG MONITORING, ED: Glucose-Capillary: 99 mg/dL (ref 65–99)

## 2015-09-04 LAB — BRAIN NATRIURETIC PEPTIDE: B NATRIURETIC PEPTIDE 5: 29.3 pg/mL (ref 0.0–100.0)

## 2015-09-04 MED ORDER — ACETAMINOPHEN 325 MG PO TABS
650.0000 mg | ORAL_TABLET | Freq: Once | ORAL | Status: AC
  Administered 2015-09-04: 650 mg via ORAL
  Filled 2015-09-04: qty 2

## 2015-09-04 MED ORDER — LEVETIRACETAM 500 MG PO TABS: 500.0000 mg | ORAL_TABLET | Freq: Two times a day (BID) | ORAL | Status: DC

## 2015-09-04 MED ORDER — DEXTROSE 5 % IV SOLN
500.0000 mg | Freq: Once | INTRAVENOUS | Status: AC
  Administered 2015-09-04: 500 mg via INTRAVENOUS
  Filled 2015-09-04: qty 500

## 2015-09-04 MED ORDER — SODIUM CHLORIDE 0.9 % IV SOLN
1000.0000 mg | Freq: Once | INTRAVENOUS | Status: AC
  Administered 2015-09-04: 1000 mg via INTRAVENOUS
  Filled 2015-09-04: qty 10

## 2015-09-04 MED ORDER — AZITHROMYCIN 250 MG PO TABS: 250.0000 mg | ORAL_TABLET | Freq: Every day | ORAL | Status: DC

## 2015-09-04 NOTE — ED Notes (Signed)
Bed: WA03 Expected date:  Expected time:  Means of arrival:  Comments: Seizure w/ hx

## 2015-09-04 NOTE — ED Notes (Addendum)
Pt here via EMS, pt's daughter stated she had "seizure like activity" with no postictal phase, no incontinence, and no biting of her tongue. 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 Pt's daughter is in route here and she does speak english. Pt only speaks [place] Per pt's daughter pt has had a cold for about 3 days. Pt was given her medicine this morning with oatmeal and the pt had a few facial ticks and her hand began spasming. Pt's daughter states that her neurologist calls this a seizure.

## 2015-09-04 NOTE — ED Notes (Signed)
Pt to xray

## 2015-09-04 NOTE — Therapy (Signed)
[facility] [address] [place], [place], [address] Phone: [phone]   Fax:  [phone]  Occupational Therapy Evaluation  Patient Details  Name: Michelle Kemp MRN: [id number] Date of Birth: August 02, 1940 Referring Provider: Dr Marvel Plan  Encounter Date: 09/02/2015      OT End of Session - 09/04/15 0655    Visit Number 1   Number of Visits 17   Date for OT Re-Evaluation 11/05/15   Authorization Type self pay   OT Start Time 1445   OT Stop Time 1530   OT Time Calculation (min) 45 min   Activity Tolerance Patient tolerated treatment well   Behavior During Therapy [facility] for tasks assessed/performed      Past Medical History  Diagnosis Date  . Stroke (HCC)   . Arthritis     Past Surgical History  Procedure Laterality Date  . Hernia repair  2001    There were no vitals filed for this visit.  Visit Diagnosis:  Spastic hemiplegia affecting nondominant side (HCC) - Plan: Ot plan of care cert/re-cert  Postural instability - Plan: Ot plan of care cert/re-cert  Unsteadiness on feet - Plan: Ot plan of care cert/re-cert  Stiffness of shoulder joint, left - Plan: Ot plan of care cert/re-cert  Pain in joint, shoulder region, left - Plan: Ot plan of care cert/re-cert  Pain, elbow joint, left - Plan: Ot plan of care cert/re-cert         [facility] OT Assessment - 09/04/15 0001    Assessment   Diagnosis Spastic hemiplegia of non dominant side, with recent Botox, phenol intervention   Onset Date 07/23/15   Prior Therapy Discharged from OP therapy 06/2015   Precautions   Precautions Fall   Required Braces or Orthoses Other Brace/Splint   Other Brace/Splint , resting hand splint   Restrictions   Weight Bearing Restrictions No   Balance Screen   Has the patient fallen in the past 6 months No   Has the patient had a decrease in activity level because of a fear of falling?  Yes   Home  Environment   Living  Arrangements Children   Available Help at Discharge Available PRN/intermittently   Type of Home Aartment   Home Access Level entry   Bathroom Shower/Tub --  roll in shower per daughter   Lives With Daughter   Prior Function   Level of Independence Independent with basic ADLs;Independent with gait;Independent with homemaking with ambulation   Leisure cook, watch movies, dance   ADL   Eating/Feeding Set up   Grooming Set up   Where Assess - Grooming Unsupported sitting   Upper Body Bathing Minimal assistance   Where Assesed Upper Body Bathing Unsupported sitting   Lower Body Bathing Moderate assistance   Where Assesed-Lower Body Bathing Supported sit to stand   Upper Body Dressing Moderate assistance   Lower Body Dressing Moderate assistance   Toilet Tranfer Moderate assistance   Toilet Transfer Method Stand pivot   Tub/Shower Transfer Method Other (comment)   Tub/Shower Transfer Equipment Other (comment)  rolling shower chair   IADL   Prior Level of Function Light Housekeeping independent   Light Housekeeping Does not participate in any housekeeping tasks   Prior Level of Function Meal Prep independent   Meal Prep Needs to have meals prepared and served   Prior Level of Function International aid/development worker Relies on family or friends for transportation   Vision - History  Baseline Vision No visual deficits   Vision Assessment   Eye Alignment Within Functional Limits   Ocular Range of Motion Within Functional Limits   Alignment/Gaze Preference Within Defined Limits   Tracking/Visual Pursuits Able to track stimulus in all quads without difficulty   Activity Tolerance   Activity Tolerance Tolerates 10-20 min activity with muiltiple rests   Sitting Balance Sits without support for 30sec   Cognition   Overall Cognitive Status Impaired/Different from baseline   Area of Impairment Safety/judgement;Awareness;Problem solving;Following commands   Following  Commands Follows one step commands consistently   Safety/Judgement Decreased awareness of deficits   Safety and Judgement Comments Limited awareness of impact of deficits on function   Awareness Intellectual   Problem Solving Difficulty sequencing;Decreased initiation   Attention Selective   Sustained Attention Appears intact   Selective Attention Impaired   Selective Attention Impairment Functional complex   Observation/Other Assessments   Focus on Therapeutic Outcomes (FOTO)  not completed - language barrier   Sensation   Light Touch Appears Intact   Stereognosis Not tested  unable to test left   Coordination   Gross Motor Movements are Fluid and Coordinated No   Fine Motor Movements are Fluid and Coordinated No   Coordination and Movement Description dense hemiplegia   Finger Nose Finger Test unable   Other Unable to complere formal testing due to hemiplegia   Perception   Perception Impaired   Spatial Orientation limited weight shift forward and to left side in sitting and standing   Praxis   Praxis Not tested   AROM   Overall AROM Comments patient with Passive shoulder motion to 75 degrees of shoulder flexion wile seated.  Patient with full internal rotation, and approximately 20 degrees of exteranl rotation   PROM   Overall PROM  Deficits   Left Shoulder Flexion 75 Degrees   Left Shoulder ABduction 70 Degrees   Left Shoulder Internal Rotation 90 Degrees   Left Shoulder External Rotation 20 Degrees   Left Elbow Flexion 130   Left Elbow Extension 150   Left Forearm Pronation 90 Degrees   Left Forearm Supination 60 Degrees   Strength   Overall Strength Deficits;Unable to assess  LUE   Hand Function   Right Hand Gross Grasp Functional   Left Hand Gross Grasp Impaired   LUE Tone   LUE Tone Moderate;Modified Ashworth;Mild   LUE Tone   Modified Ashworth Scale for Grading Hypertonia LUE More marked increase in muscle tone through most of the ROM, but affected part(s)  easily moved                         OT Education - 09/04/15 9604    Education provided Yes   Education Details Reviewed purpose for OT (vs PT) and potential goals   Person(s) Educated Patient;Child(ren)  daughter Michelle Kemp   Methods Explanation   Comprehension Verbalized understanding          OT Short Term Goals - 09/04/15 0706    OT SHORT TERM GOAL #1   Title Patient will report no greater than 3/10 pain with elbow flexion and extension in HEP , due 10/05/14   Time 4   Period Weeks   Status New   OT SHORT TERM GOAL #2   Title Patient will demonstrate 5 degree increase in shoulder flexion to aide personal care with upper body bathing, and dressing   Baseline 75 degrees (goal 80 degrees)   Time 4  Period Weeks   Status New   OT SHORT TERM GOAL #3   Title Patient will assist caregiver in left arm management when rolling from supine to sidelying on right side with visual / verbal cueing.   Time 4   Period Weeks   Status New   OT SHORT TERM GOAL #4   Title Patient will transition from sit to stand from chair without pulling up on surface with right arm to aide in repositioning self for comfort when alone in apartment during the afternoon   Time 4   Period Weeks   Status New   OT SHORT TERM GOAL #5   Title Patient will transfer to toilet with mod assist, and stand safely to allow clothing management   Time 4   Period Weeks   Status New           OT Long Term Goals - 09/04/15 0715    OT LONG TERM GOAL #1   Title Patient and caregiver will independently complete HEP  for LUE with pain report no greater than 1-2/10   Time 8   Period Weeks   Status New   OT LONG TERM GOAL #2   Title Patient will transfer to toilet with min assist   Time 8   Period Weeks   Status New   OT LONG TERM GOAL #3   Title Patient will complete toilet hygiene with min assist   Time 8   Period Weeks   Status New   OT LONG TERM GOAL #4   Title Patient will roll from supine  to/from sidelying with minimal assistance to reposition self in bed for comfort   Time 8   Period Weeks   Status New   OT LONG TERM GOAL #5   Title Patient will demonstrate 10 degree increase in shoulder flexion to aide in personal grooming, bathing and upper body dressing   Baseline 75 degree - goal 85 degree   Time 8   Period Weeks   Status New   Long Term Additional Goals   Additional Long Term Goals Yes   OT LONG TERM GOAL #6   Title Patient will grasp and release 1-3 inch object, with left hand, with minimal assistance,  as part of bimanual ADL task   Time 8   Period Weeks   Status New               Plan - 09/04/15 0657    Clinical Impression Statement Patient is a 75 year old female known to this clinician from previous OT course in 2016.  Patient was discharged from OT with intent to pursue more aggressive spasticity management.  Patient received botox injections on 07/23/15 throughout upper extremity, and again on 08/24/15 recieved injection of phenol to furhter address spasticity in elbow flexors.  Patient requires caregiver assistance for all ADL's at this time due to impaired functional mobility, non dominant hemiplegia, intermittent pain in knees, lef thip, left elbow, left shoulder, perceptual deficits, cognitve deficits, and general deconditioning / limited activity tolerance. Patient is motivated for greater indepedndence with ADL and functional mobility, and has tremendous family support.  Patient will benefit from OT to address how decreased spasticity / muscle tension may aid in her ability to participate in basic self care skills, and to address positioning / support to increase passive and active motion in left UE.       Pt will benefit from skilled therapeutic intervention in order to improve on the following  deficits (Retired) Decreased activity tolerance;Decreased balance;Decreased coordination;Decreased endurance;Impaired flexibility;Impaired perceived functional  ability;Decreased safety awareness;Decreased range of motion;Decreased mobility;Decreased knowledge of use of DME;Decreased knowledge of precautions;Decreased strength;Increased edema;Increased muscle spasms;Impaired sensation;Pain;Impaired tone;Impaired UE functional use;Impaired vision/preception   Rehab Potential Good   Clinical Impairments Affecting Rehab Potential limited financial resources   OT Frequency 2x / week   OT Duration 8 weeks   OT Treatment/Interventions Self-care/ADL training;Electrical Stimulation;Cryotherapy;Moist Heat;Visual/perceptual remediation/compensation;Patient/family education;Balance training;Splinting;Therapeutic exercises;Therapeutic activities;Neuromuscular education;Functional Mobility Training;Fluidtherapy;Biofeedback;DME and/or AE instruction;Cognitive remediation/compensation;Ultrasound   Plan Review OT goals, upgrade home exercise program left UE, consider elbow splint for night   Consulted and Agree with Plan of Care Patient;Family member/caregiver   Family Member Consulted Daughter        Problem List Patient Active Problem List   Diagnosis Date Noted  . Seizures (HCC) 06/21/2015  . Hemorrhage, intracerebral (HCC) 02/03/2015  . Hemiparesis (HCC) 02/03/2015  . Spastic hemiplegia affecting nondominant side (HCC) 02/03/2015    Collier Salina, OTR/L 09/04/2015, 7:29 AM  [facility] [address] [place], [place], [address] Phone: [phone]   Fax:  [phone]  Name: Michelle Kemp MRN: [id number] Date of Birth: 1941/03/11

## 2015-09-04 NOTE — ED Provider Notes (Signed)
CSN: [id number]     Arrival date & time 09/04/15  1538 History   First MD Initiated Contact with Patient 09/04/15 1543     Chief Complaint  Patient presents with  . Seizures     (Consider location/radiation/quality/duration/timing/severity/associated sxs/prior Treatment) Patient is a 75 y.o. female presenting with seizures.  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 Stroke (HCC)   . Arthritis   . Seizures [facility])    Past Surgical History  Procedure Laterality Date  . Hernia repair  2001   Family History  Problem Relation Age of Onset  . Arthritis    . Stroke Neg Hx    Social History  Substance Use Topics  . Smoking status: Never Smoker   . Smokeless tobacco: None  . Alcohol Use: No   OB History    No data available     Review of Systems  Constitutional: Positive for fever. Negative for chills.  Respiratory: Positive for cough.   Gastrointestinal: Negative for nausea.  Neurological: Positive for seizures.  All other systems reviewed and are negative.     Allergies  Review of patient's allergies indicates no known allergies.  Home Medications   Prior to Admission medications   Medication Sig Start Date End Date Taking? Authorizing Provider  aspirin EC 81 MG tablet Take 1 tablet (81 mg total) by mouth daily. 04/12/15  Yes Marvel Plan, MD  baclofen (LIORESAL) 10 MG tablet Take 1 tablet (10 mg total) by mouth 3 (three) times daily. Patient taking differently: Take 10 mg by mouth 2 (two) times daily.  02/03/15  Yes Anson Fret, MD  budesonide-formoterol [facility]) 160-4.5 MCG/ACT inhaler Inhale 2 puffs into the lungs 2 (two) times daily.   Yes  Historical Provider, MD  Cholecalciferol (VITAMIN D-3 PO) Take 5,000 Units by mouth daily.    Yes Historical Provider, MD  Cyanocobalamin (VITAMIN B-12 PO) Take 5 mLs by mouth daily.   Yes Historical Provider, MD  Dextromethorphan Polistirex (DELSYM PO) Take 5 mLs by mouth daily as needed (cough).   Yes Historical Provider, MD  Fish Oil OIL Take 1 capsule by mouth daily.    Yes Historical Provider, MD  Pyridoxine HCl (VITAMIN B-6 PO) Take 5 mLs by mouth daily.   Yes Historical Provider, MD  azithromycin (ZITHROMAX) 250 MG tablet Take 1 tablet (250 mg total) by mouth daily. Take first 2 tablets together, then 1 every day until finished. 09/05/15   Marily Memos, MD  levETIRAcetam (KEPPRA) 500 MG tablet Take 1 tablet (500 mg total) by mouth 2 (two) times daily. 09/04/15   Barbara Cower Sigifredo Pignato, MD   BP 134/83 mmHg  Pulse 87  Temp(Src) 99.6 F (37.6 C) (Oral)  Resp 22  SpO2 98% Physical Exam  Constitutional: She is oriented to person, place, and time. She appears well-developed and well-nourished.  HENT:  Head: Normocephalic and atraumatic.  Neck: Normal range of motion.  Cardiovascular: Normal rate and regular rhythm.   Pulmonary/Chest: Effort normal. No stridor. No respiratory distress.  Abdominal: Soft. She exhibits no distension. There is no tenderness.  Musculoskeletal: Normal range of motion. She exhibits no edema or tenderness.  Neurological: She is  alert and oriented to person, place, and time.  Skin: Skin is warm and dry.  Nursing note and vitals reviewed.   ED Course  Procedures (including critical care time) Labs Review Labs Reviewed  CBC - Abnormal; Notable for the following:    MCHC 37.8 (*)    All other components within normal limits  BASIC METABOLIC PANEL - Abnormal; Notable for the following:    Glucose, Bld 100 (*)    All other components within normal limits  URINALYSIS, ROUTINE W REFLEX MICROSCOPIC (NOT AT [facility])  BRAIN NATRIURETIC PEPTIDE  INFLUENZA PANEL BY PCR (TYPE A  & B, H1N1)  CBG MONITORING, ED    Imaging Review Dg Chest 2 View  09/04/2015  CLINICAL DATA:  Evaluation for pneumonia. Left lower lobe crackles. Cough. EXAM: CHEST  2 VIEW COMPARISON:  Chest CT 01/18/2015 FINDINGS: Cardiomegaly with vascular congestion. No confluent opacities, effusions or edema. No acute bony abnormality. IMPRESSION: Cardiomegaly, vascular congestion. Electronically Signed   By: Charlett Nose M.D.   On: 09/04/2015 17:30   I have personally reviewed and evaluated these images and lab results as part of my medical decision-making.   EKG Interpretation None      MDM   Final diagnoses:  Community acquired pneumonia    Suspect likely pneumonia. No h/o heart failure, has a fever, cough and what appears to be multifocal pneumonia. This likely caused breakthrought seizure along with her not taking keppra this AM. Loaded with keppra, started azithromycin. Dc on same.      Marily Memos, MD 09/04/15 2350

## 2015-09-05 LAB — INFLUENZA PANEL BY PCR (TYPE A & B)
H1N1 flu by pcr: DETECTED — AB
Influenza A By PCR: POSITIVE — AB
Influenza B By PCR: NEGATIVE

## 2015-09-06 ENCOUNTER — Ambulatory Visit: Payer: Self-pay | Admitting: Physical Therapy

## 2015-09-06 ENCOUNTER — Ambulatory Visit: Payer: Self-pay | Admitting: Occupational Therapy

## 2015-09-06 ENCOUNTER — Encounter: Payer: Self-pay | Admitting: Occupational Therapy

## 2015-09-08 ENCOUNTER — Encounter: Payer: Self-pay | Admitting: Occupational Therapy

## 2015-09-08 ENCOUNTER — Ambulatory Visit: Payer: Self-pay | Admitting: Physical Therapy

## 2015-09-09 ENCOUNTER — Ambulatory Visit: Payer: Self-pay | Admitting: Physical Therapy

## 2015-09-09 ENCOUNTER — Encounter: Payer: Self-pay | Admitting: Occupational Therapy

## 2015-09-10 ENCOUNTER — Ambulatory Visit: Payer: Self-pay

## 2015-09-10 ENCOUNTER — Ambulatory Visit: Payer: Self-pay | Admitting: Physical Therapy

## 2015-09-10 ENCOUNTER — Ambulatory Visit: Payer: Self-pay | Attending: Neurology | Admitting: Occupational Therapy

## 2015-09-10 ENCOUNTER — Encounter: Payer: Self-pay | Admitting: Occupational Therapy

## 2015-09-10 DIAGNOSIS — M6281 Muscle weakness (generalized): Secondary | ICD-10-CM | POA: Insufficient documentation

## 2015-09-10 DIAGNOSIS — G8110 Spastic hemiplegia affecting unspecified side: Secondary | ICD-10-CM | POA: Insufficient documentation

## 2015-09-10 DIAGNOSIS — M25512 Pain in left shoulder: Secondary | ICD-10-CM | POA: Insufficient documentation

## 2015-09-10 DIAGNOSIS — M25522 Pain in left elbow: Secondary | ICD-10-CM | POA: Insufficient documentation

## 2015-09-10 DIAGNOSIS — R269 Unspecified abnormalities of gait and mobility: Secondary | ICD-10-CM

## 2015-09-10 DIAGNOSIS — R293 Abnormal posture: Secondary | ICD-10-CM | POA: Insufficient documentation

## 2015-09-10 DIAGNOSIS — R2681 Unsteadiness on feet: Secondary | ICD-10-CM | POA: Insufficient documentation

## 2015-09-10 NOTE — Therapy (Signed)
[facility] [address] [place], [place], [address] Phone: [phone]   Fax:  [phone]  Physical Therapy Evaluation  Patient Details  Name: Michelle Kemp MRN: [id number] Date of Birth: [birth year] Referring Provider: Dr. Roda Shutters  Encounter Date: 09/10/2015      PT End of Session - 09/10/15 1510    Visit Number 1   Number of Visits 17   Date for PT Re-Evaluation 11/09/15   Authorization Type Self pay   PT Start Time 1401   PT Stop Time 1444   PT Time Calculation (min) 43 min   Equipment Utilized During Treatment Gait belt   Activity Tolerance Patient tolerated treatment well   Behavior During Therapy [facility] for tasks assessed/performed      Past Medical History  Diagnosis Date  . Stroke (HCC)   . Arthritis   . Seizures [facility])     Past Surgical History  Procedure Laterality Date  . Hernia repair  2001    There were no vitals filed for this visit.  Visit Diagnosis:  Abnormality of gait - Plan: PT plan of care cert/re-cert  Muscle weakness - Plan: PT plan of care cert/re-cert  Postural instability - Plan: PT plan of care cert/re-cert      Subjective Assessment - 09/10/15 1417    Subjective Pt's dtr present to translate [name]). Pt received botox injection in LLE on 07/2015 and is resuming PT in order to improve amb. Pt went to the ED on Saturday due to coughing/sneezing/allergy and was dx with PNA. Pt experienced a seizure on Saturday and so dtr called 911, and pt went to ED. Pt's family has continued to assist pt with exercises and walking. Pt has been fatigued since PNA onset. Pt's arm pain has subsided since injections.    Patient is accompained by: Family member  dtr-Julie   Pertinent History Seizures, pt's dtr reports pt is allergic to Delsym and OTC motrin   Patient Stated Goals To walk again    Currently in Pain? No/denies            [facility] PT Assessment - 09/10/15 1425    Assessment   Medical  Diagnosis Spastic hemiplegia affecting nondominant side   Referring Provider Dr. Roda Shutters   Onset Date/Surgical Date 11/07/14   Prior Therapy OPPT neuro   Precautions   Precautions Fall   Required Braces or Orthoses Other Brace/Splint  L metal double upright AFO   Restrictions   Weight Bearing Restrictions No   Balance Screen   Has the patient fallen in the past 6 months No   Has the patient had a decrease in activity level because of a fear of falling?  No   Is the patient reluctant to leave their home because of a fear of falling?  No   Home Environment   Living Environment Private residence   Living Arrangements Children   Available Help at Discharge Family   Type of Home Apartment   Home Access Level entry   Home Layout One level   Home Equipment Bedside commode;Wheelchair - Proofreader - 2 wheels   Prior Function   Level of Independence Independent with basic ADLs;Independent with gait;Independent with homemaking with ambulation   Leisure cooking, watching movies, dance   Sensation   Light Touch Appears Intact   Additional Comments Pt denied N/T.   Posture/Postural Control   Posture/Postural Control Postural limitations   Postural Limitations Forward head;Flexed trunk   ROM / Strength   AROM /  PROM / Strength AROM;Strength   AROM   Overall AROM  Deficits   Overall AROM Comments R LE WFL. LLE: pt unable to fully extend L knee 2/2 weakness, decreased L ankle DF-not formally assessed due to time constraints and pt had L AFO donned.   PROM   Overall PROM  Deficits   Strength   Overall Strength Deficits   Overall Strength Comments R hip flex: 2+/5, R knee flex/ext: 4/5, R ankle DF: 4/5. LLE: hemiplegia with limited movement, approx 1/5 grossly.   Transfers   Transfers Sit to Stand;Stand to [employer] Transfers   Sit to Stand 2: Max assist   Sit to Stand Details Verbal cues for technique;Verbal cues for sequencing;Tactile cues for placement   Sit to Stand  Details (indicate cue type and reason) +2 assist to ensure safety. Cues and facilitation for ant. wt. shifting and sequencing.    Stand to Sit 4: Min assist   Stand to Sit Details (indicate cue type and reason) Tactile cues for placement;Verbal cues for technique   Stand to Sit Details Cues to improve eccentric control and for hand placement   Ambulation/Gait   Ambulation/Gait Yes   Ambulation/Gait Assistance 3: Mod assist  +2 assist to ensure safety.   Ambulation/Gait Assistance Details Cues for sequencing and assist to guide RW, improve upright posture, stay within RW, and to guide LLE.   Ambulation Distance (Feet) 15 Feet   Assistive device Rolling walker   Gait Pattern Step-to pattern;Decreased hip/knee flexion - left;Decreased step length - left;Decreased step length - right;Decreased stance time - left;Trunk rotated posteriorly on left;Trunk flexed;Decreased dorsiflexion - left;Wide base of support   Ambulation Surface Level;Indoor                           PT Education - 09/10/15 1510    Education provided Yes   Education Details PT discussed frequency/duration.   Person(s) Educated Patient;Child(ren)   Methods Explanation   Comprehension Verbalized understanding          PT Short Term Goals - 09/10/15 1514    PT SHORT TERM GOAL #1   Title The patient will perform HEP at MOD I level (with assist from family) in order to improve balance, strength, and flexibility. Target date: 10/08/15   Status New   PT SHORT TERM GOAL #2   Title The patient will consistently perform sit<>stand with min A and 25% cueing to indicate increased independence during transfers.  Modified Target date: 10/08/15   Baseline --  continue goal through renewed POC   Status New   PT SHORT TERM GOAL #3   Title Assess bed mobility and write goal. Target date: 10/08/15   Status New   PT SHORT TERM GOAL #4   Title Perform w/c to mat transfer and write goal. Target date: 10/08/15   Status  New   PT SHORT TERM GOAL #5   Title The patient will ambulate 1' with RW with max A and +1 assist to improve functional mobility.. Target date 10/08/15   Status New           PT Long Term Goals - 09/10/15 1518    PT LONG TERM GOAL #1   Title Pt will amb. 57' with RW and mod A to improve functional mobility. Target date: 11/05/15   Baseline --  continue   Status New   PT LONG TERM GOAL #2   Title The patient will perform sit <>  stand with CGA and RW to improve functional mobility. Target date: 11/05/15    Baseline --  continue   Status New                                                                                                         Plan - 09/10/15 1511    Clinical Impression Statement Pt is a 75y/o female, presenting to OPPT neuro again for evaluation after LLE botox injections, as progress was limited last episode of care 2/2 increased tone. Pt presented with the following deficits: gait deviations, impaired balance, decreased strength, decreased ROM, required assist during all transfers. PT will assess bed mobility next session, as eval was limited 2/2 time constraints to allow time for translation.   Pt will benefit from skilled therapeutic intervention in order to improve on the following deficits Abnormal gait;Decreased balance;Decreased mobility;Decreased strength;Postural dysfunction;Impaired flexibility;Pain;Decreased activity tolerance;Decreased endurance;Increased muscle spasms;Difficulty walking   Rehab Potential Good   Clinical Impairments Affecting Rehab Potential patient private pay for services   PT Frequency 2x / week   PT Duration 8 weeks   PT Treatment/Interventions Electrical Stimulation;Gait training;Stair training;Neuromuscular re-education;Balance training;Therapeutic exercise;Therapeutic activities;Functional mobility training;Patient/family education;Wheelchair mobility training;Manual techniques;Orthotic  Fit/Training;ADLs/Self Care Home Management;Biofeedback;DME Instruction   PT Next Visit Plan Assess bed mobility and w/c to mat transfers and write appropriate goals. Review previous HEP and modify prn. Gait training (platform for L arm?)   Consulted and Agree with Plan of Care Patient;Family member/caregiver   Family Member Consulted WUJ:[id number]:Julie         Problem List Patient Active Problem List   Diagnosis Date Noted  . Seizures (HCC) 06/21/2015  . Hemorrhage, intracerebral (HCC) 02/03/2015  . Hemiparesis (HCC) 02/03/2015  . Spastic hemiplegia affecting nondominant side (HCC) 02/03/2015    Zuriyah Shatz L 09/10/2015, 3:23 PM  [facility] [address] [place], [place], [address] Phone: [phone]   Fax:  [phone]  Name: Lillia Abedkuwavi Flannigan MRN: [id number] Date of Birth: 11/17/1940    Zerita BoersJennifer Eily Louvier, PT,DPT 09/10/2015 3:23 PM Phone: [phone] Fax: [phone]

## 2015-09-10 NOTE — Therapy (Signed)
[facility] [address] [place], [place], [address] Phone: [phone]   Fax:  [phone]  Occupational Therapy Treatment  Patient Details  Name: Michelle Kemp MRN: [id number] Date of Birth: 04/19/1941 Referring Provider: Dr Marvel PlanJindong Xu  Encounter Date: 09/10/2015      OT End of Session - 09/10/15 1549    Visit Number 2   Number of Visits 17   Date for OT Re-Evaluation 11/05/15   Authorization Type self pay   OT Start Time 1445   OT Stop Time 1530   OT Time Calculation (min) 45 min   Activity Tolerance Patient tolerated treatment well   Behavior During Therapy [facility] for tasks assessed/performed      Past Medical History  Diagnosis Date  . Stroke (HCC)   . Arthritis   . Seizures [facility](HCC)     Past Surgical History  Procedure Laterality Date  . Hernia repair  2001    There were no vitals filed for this visit.  Visit Diagnosis:  Postural instability  Unsteadiness on feet  Spastic hemiplegia affecting nondominant side (HCC)  Pain in joint, shoulder region, left  Pain, elbow joint, left      Subjective Assessment - 09/10/15 1543    Subjective  Patient indicates that she wants to be able to get up from a chair without help, so she can change her position during the day   Patient is accompained by: Family member  Daughter Raynelle FanningJulie   Currently in Pain? Yes   Pain Score 5    Pain Location Elbow   Pain Orientation Left   Pain Descriptors / Indicators Aching   Pain Type Chronic pain   Pain Onset More than a month ago   Pain Frequency Intermittent   Aggravating Factors  end range flexion or extension   Pain Relieving Factors reposition, rest   Multiple Pain Sites No                      OT Treatments/Exercises (OP) - 09/10/15 1545    ADLs   LB Dressing Patient able to allow arm to passively flex at shoulder to "reach" toward shoelace, once target / objective in sight, patientable to pinch  and release shoelace to untie shoe.  With subsequent attempts, patient able to begin to actively flex shoulder and extend elbow to reach hand toward shoe.     Functional Mobility Practiced sit to stand from chair with arms, and from mat table.  Emphasized with patient the improtance of feet positioned behind knees if possible, body positioned to front of chair (scoot forward) and significant weight shift of trunk forward.  Patiwent hesitant for initial push from legs to lift off surface, yet with practice this improved such that this transition imoproved from max assist to min assist with repetition.                  OT Education - 09/10/15 1549    Education provided Yes   Education Details sit to stand mechanics, alignment   Person(s) Educated Patient;Child(ren)   Methods Explanation;Demonstration;Tactile cues;Verbal cues   Comprehension Tactile cues required;Verbal cues required;Need further instruction          OT Short Term Goals - 09/10/15 1603    OT SHORT TERM GOAL #1   Title Patient will report no greater than 3/10 pain with elbow flexion and extension in HEP , due 10/05/14   Status On-going   OT SHORT TERM GOAL #2  Title Patient will demonstrate 5 degree increase in shoulder flexion to aide personal care with upper body bathing, and dressing   Status On-going   OT SHORT TERM GOAL #3   Title Patient will assist caregiver in left arm management when rolling from supine to sidelying on right side with visual / verbal cueing.   Status On-going   OT SHORT TERM GOAL #4   Title Patient will transition from sit to stand from chair without pulling up on surface with right arm to aide in repositioning self for comfort when alone in apartment during the afternoon   Status On-going   OT SHORT TERM GOAL #5   Title Patient will transfer to toilet with mod assist, and stand safely to allow clothing management   Status On-going           OT Long Term Goals - 09/10/15 1604    OT  LONG TERM GOAL #1   Title Patient and caregiver will independently complete HEP  for LUE with pain report no greater than 1-2/10   Status On-going   OT LONG TERM GOAL #2   Title Patient will transfer to toilet with min assist   Status On-going   OT LONG TERM GOAL #3   Title Patient will complete toilet hygiene with min assist   Status On-going   OT LONG TERM GOAL #4   Title Patient will roll from supine to/from sidelying with minimal assistance to reposition self in bed for comfort   Status On-going   OT LONG TERM GOAL #5   Title Patient will demonstrate 10 degree increase in shoulder flexion to aide in personal grooming, bathing and upper body dressing   Status On-going   OT LONG TERM GOAL #6   Title Patient will grasp and release 1-3 inch object, with left hand, with minimal assistance,  as part of bimanual ADL task   Status On-going               Plan - 09/10/15 1550    Clinical Impression Statement Patient demonstrates significant reduction in muscle tone in left shoulder, elbow and digits.  Patient is inattentive to this side, so needs max cueing to learn to activate amore balanced muscle pattern for fututre function.  Patient very motivated for improved functional mobility.    Pt will benefit from skilled therapeutic intervention in order to improve on the following deficits (Retired) Decreased activity tolerance;Decreased balance;Decreased coordination;Decreased endurance;Impaired flexibility;Impaired perceived functional ability;Decreased safety awareness;Decreased range of motion;Decreased mobility;Decreased knowledge of use of DME;Decreased knowledge of precautions;Decreased strength;Increased edema;Increased muscle spasms;Impaired sensation;Pain;Impaired tone;Impaired UE functional use;Impaired vision/preception   Rehab Potential Good   Clinical Impairments Affecting Rehab Potential limited financial resources   OT Frequency 2x / week   OT Duration 8 weeks   OT  Treatment/Interventions Self-care/ADL training;Electrical Stimulation;Cryotherapy;Moist Heat;Visual/perceptual remediation/compensation;Patient/family education;Balance training;Splinting;Therapeutic exercises;Therapeutic activities;Neuromuscular education;Functional Mobility Training;Fluidtherapy;Biofeedback;DME and/or AE instruction;Cognitive remediation/compensation;Ultrasound   Plan NMR lue, Functional mobility   Consulted and Agree with Plan of Care Patient;Family member/caregiver   Family Member Consulted Daughter        Problem List Patient Active Problem List   Diagnosis Date Noted  . Seizures (HCC) 06/21/2015  . Hemorrhage, intracerebral (HCC) 02/03/2015  . Hemiparesis (HCC) 02/03/2015  . Spastic hemiplegia affecting nondominant side (HCC) 02/03/2015    Collier Salina, OTR/L 09/10/2015, 4:05 PM  [facility] [address] [place], [place], [address] Phone: [phone]   Fax:  [phone]  Name: Frankee Gritz MRN:  [id number] Date of Birth: 07-01-1941

## 2015-09-13 ENCOUNTER — Encounter: Payer: Self-pay | Admitting: Occupational Therapy

## 2015-09-13 ENCOUNTER — Encounter: Payer: Self-pay | Admitting: Physical Therapy

## 2015-09-13 ENCOUNTER — Ambulatory Visit: Payer: Self-pay | Admitting: Physical Therapy

## 2015-09-13 ENCOUNTER — Ambulatory Visit: Payer: Self-pay | Admitting: Occupational Therapy

## 2015-09-13 DIAGNOSIS — G8110 Spastic hemiplegia affecting unspecified side: Secondary | ICD-10-CM

## 2015-09-13 DIAGNOSIS — M6281 Muscle weakness (generalized): Secondary | ICD-10-CM

## 2015-09-13 DIAGNOSIS — M25512 Pain in left shoulder: Secondary | ICD-10-CM

## 2015-09-13 DIAGNOSIS — M25522 Pain in left elbow: Secondary | ICD-10-CM

## 2015-09-13 DIAGNOSIS — R269 Unspecified abnormalities of gait and mobility: Secondary | ICD-10-CM

## 2015-09-13 DIAGNOSIS — R293 Abnormal posture: Secondary | ICD-10-CM

## 2015-09-13 NOTE — Therapy (Signed)
[facility] [address] [place], [place], [address] Phone: [phone]   Fax:  [phone]  Physical Therapy Treatment  Patient Details  Name: Michelle Kemp MRN: [id number] Date of Birth: 16-May-1941 Referring Provider: Dr. Erlinda Hong  Encounter Date: 09/13/2015      PT End of Session - 09/13/15 1616    Visit Number 2   Number of Visits 17   Date for PT Re-Evaluation 11/09/15   Authorization Type Self pay   PT Start Time 1450   PT Stop Time 1536   PT Time Calculation (min) 46 min   Equipment Utilized During Treatment Gait belt      Past Medical History  Diagnosis Date  . Stroke ([place])   . Arthritis   . Seizures [facility])     Past Surgical History  Procedure Laterality Date  . Hernia repair  2001    There were no vitals filed for this visit.  Visit Diagnosis:  Abnormality of gait  Muscle weakness  Spastic hemiplegia affecting nondominant side (HCC)      Subjective Assessment - 09/13/15 1604    Subjective Pt is accompanied to PT by her daughter, Michelle Kemp; she states that pt does not like wearing the brace (double metal upright) and walks at home without it; daughter states she will have her start wearing it more at home   Patient is accompained by: Family member  daughter Michelle Kemp   Pertinent History Seizures, pt's dtr reports pt is allergic to Delsym and OTC motrin   Patient Stated Goals To walk again    Currently in Pain? Yes   Pain Score 4    Pain Location Elbow   Pain Orientation Left   Pain Descriptors / Indicators Aching   Pain Type Chronic pain   Pain Onset More than a month ago   Pain Frequency Intermittent   Multiple Pain Sites No                         [facility] Adult PT Treatment/Exercise - 09/13/15 1608    Bed Mobility   Rolling Left 4: Min guard   Supine to Sit 4: Min guard   Sit to Supine 3: Mod assist  to transfer LE's (with brace on LLE) onto mat   Transfers   Transfers Sit  to Stand;Stand to Sit;Stand Pivot Transfers   Sit to Stand 2: Max assist   Sit to Stand Details (indicate cue type and reason) +2 assist for safety; cues for feet placement, to scoot out and to lean forward   Stand to Sit 3: Mod assist  for controlled descent   Ambulation/Gait   Ambulation/Gait Yes   Ambulation/Gait Assistance 3: Mod assist   Ambulation Distance (Feet) 80 Feet  40' 2nd rep - blue shoe cover placed on L shoe to fascilitat   Assistive device Left platform walker   Gait Pattern Step-to pattern;Decreased hip/knee flexion - left;Decreased step length - left;Decreased step length - right;Decreased stance time - left;Trunk rotated posteriorly on left;Trunk flexed;Decreased dorsiflexion - left;Wide base of support  L pelvis rotated posteriorly   Ambulation Surface Level;Indoor   Lumbar Exercises: Stretches   Single Knee to Chest Stretch 3 reps;20 seconds   Lumbar Exercises: Supine   Clam 10 reps  with manual moderate resistance   Bent Knee Raise 10 reps   Bridge 10 reps     Pt performed stand pivot transfer mat to wheelchair with max assist; Daughter Michelle Kemp) states that  pt is able to transfer supine to sit at home modified independently but is unable to transfer sit to supine  Independently - reports that she needs assistance to transfer LE's up onto bed (uses hooking to lift LLE with RLE) but she is then unable To swing her legs from seated position up onto mat - daughter states that this is something that pt wants to be able to do independently At home             PT Short Term Goals - 09/13/15 1621    PT SHORT TERM GOAL #8   Title Pt will transfer wheelchair to/from mat with mod assist.  (10-14-15)   Baseline max assist on 09-13-15   Time 4   Period Weeks   Status New           PT Long Term Goals - 09/10/15 1518    PT LONG TERM GOAL #1   Title Pt will amb. 69' with RW and mod A to improve functional mobility. Target date: 11/05/15   Baseline --  continue    Status New   PT LONG TERM GOAL #2   Title The patient will perform sit <> stand with CGA and RW to improve functional mobility. Target date: 11/05/15    Baseline --  continue   Status New   PT LONG TERM GOAL #3   Title --   Baseline --   Status --   PT LONG TERM GOAL #4   Title --   Baseline --   Status --   PT LONG TERM GOAL #5   Title --   Baseline --   Status --   PT LONG TERM GOAL #6   Title --   Baseline \   Status Not Met   PT LONG TERM GOAL #7   Title --   Baseline --   Status --   PT LONG TERM GOAL #8   Title --   Baseline 06/30/15: pt ambulating short distances (~20 feet ) currently at home with family assistance per nephew report   Status --               Plan - 09/13/15 1617    Clinical Impression Statement Pt's double metal upright AFO on LLE may need adjusting as pt not consistenly clearing floor in swing phase of gait; blue shoe cover significantly increased ease with swing through LLE; ambulation distance greatly increased today compared to that on day of initial eval (Fri., 09-10-15)   Pt will benefit from skilled therapeutic intervention in order to improve on the following deficits Abnormal gait;Decreased balance;Decreased mobility;Decreased strength;Postural dysfunction;Impaired flexibility;Pain;Decreased activity tolerance;Decreased endurance;Increased muscle spasms;Difficulty walking   Rehab Potential Good   Clinical Impairments Affecting Rehab Potential patient private pay for services   PT Frequency 2x / week   PT Duration 8 weeks   PT Treatment/Interventions Electrical Stimulation;Gait training;Stair training;Neuromuscular re-education;Balance training;Therapeutic exercise;Therapeutic activities;Functional mobility training;Patient/family education;Wheelchair mobility training;Manual techniques;Orthotic Fit/Training;ADLs/Self Care Home Management;Biofeedback;DME Instruction   PT Next Visit Plan Assess bed mobility  and write appropriate goals.  Review previous HEP and modify prn. Gait training (platform for L arm?)   Consulted and Agree with Plan of Care Patient;Family member/caregiver   Family Member Consulted NIO:EVOJJ        Problem List Patient Active Problem List   Diagnosis Date Noted  . Seizures ([place]) 06/21/2015  . Hemorrhage, intracerebral ([place]) 02/03/2015  . Hemiparesis ([place]) 02/03/2015  . Spastic hemiplegia affecting nondominant side ([place]) 02/03/2015  Alda Lea, PT 09/13/2015, 4:30 PM  [facility] [address] [place], [place], [address] Phone: [phone]   Fax:  [phone]  Name: Gianni Fuchs MRN: [id number] Date of Birth: 1940-12-12

## 2015-09-13 NOTE — Therapy (Signed)
[facility] [address] [place], [place], [address] Phone: [phone]   Fax:  [phone]  Occupational Therapy Treatment  Patient Details  Name: Michelle Kemp MRN: [id number] Date of Birth: [birth year] Referring Provider: Dr Marvel Plan  Encounter Date: 09/13/2015      OT End of Session - 09/13/15 1531    Visit Number 3   Number of Visits 17   Date for OT Re-Evaluation 11/05/15   Authorization Type self pay   OT Start Time 1403   OT Stop Time 1445   OT Time Calculation (min) 42 min   Activity Tolerance Patient tolerated treatment well   Behavior During Therapy [facility] for tasks assessed/performed      Past Medical History  Diagnosis Date  . Stroke (HCC)   . Arthritis   . Seizures [facility])     Past Surgical History  Procedure Laterality Date  . Hernia repair  2001    There were no vitals filed for this visit.  Visit Diagnosis:  Postural instability  Spastic hemiplegia affecting nondominant side (HCC)  Pain in joint, shoulder region, left  Pain, elbow joint, left      Subjective Assessment - 09/13/15 1522    Subjective  Patient transferred herself back to bed x1 per daughter.  Patient cannot quite figure out how she did this to replicate it.     Patient is accompained by: Family member   Currently in Pain? Yes   Pain Score 4    Pain Location Elbow   Pain Orientation Left   Pain Descriptors / Indicators Aching   Pain Type Chronic pain   Pain Onset More than a month ago   Pain Frequency Intermittent   Aggravating Factors  end range   Pain Relieving Factors rest                      OT Treatments/Exercises (OP) - 09/13/15 0001    ADLs   Functional Mobility Patient very interested in being able to turn herself over in bed without help.  Practiced using flexion versus extension to aide with rolling left and right from supine.  Patient needed initial assistance to flex hip and knees for  this task, but able to sustain hip/knee flexion.  Patient needed mod assist initially to activate flexion, adduction in left upper extremity to aide in rolling toward right side.  Once this motion was demonstrated, patient able to participate with minimal assistance, and moderate cueing.  Patient abel to roll left / right from supine without assistance after demonstration and repetitive practice.     Neurological Re-education Exercises   Other Exercises 1 Neuromuscular reeducation to left shoulder, elbow, wrist and digits.  Patient in sidelying able to focus movement on more distal isolated components of pinch, opposition, digit extension, wrist motion,  Patient  in supine when fullly able to visually attend able to isolate elbow flexion (with gravity) and extension against gravity.  Working to reduce pectoralis activation with every upper extremity attempt at movement.  Working to have patient reach toward floor to allow arm to felx into gravity, and elbow to activelty extend and then use left hand functionally to pinch strap of AFO - to open, to untie or tighten shoelace.  Patient needs to have perception that left upper extremity can have some functional purpose as patient often is inattentive to this side.                 OT  Education - 09/13/15 1531    Education provided Yes   Education Details reach to floor to grasp shoelace to untie, tighten.     Person(s) Educated Patient;Child(ren)   Methods Explanation;Demonstration;Tactile cues;Verbal cues   Comprehension Verbalized understanding;Need further instruction;Verbal cues required;Tactile cues required          OT Short Term Goals - 09/10/15 1603    OT SHORT TERM GOAL #1   Title Patient will report no greater than 3/10 pain with elbow flexion and extension in HEP , due 10/05/14   Status On-going   OT SHORT TERM GOAL #2   Title Patient will demonstrate 5 degree increase in shoulder flexion to aide personal care with upper body  bathing, and dressing   Status On-going   OT SHORT TERM GOAL #3   Title Patient will assist caregiver in left arm management when rolling from supine to sidelying on right side with visual / verbal cueing.   Status On-going   OT SHORT TERM GOAL #4   Title Patient will transition from sit to stand from chair without pulling up on surface with right arm to aide in repositioning self for comfort when alone in apartment during the afternoon   Status On-going   OT SHORT TERM GOAL #5   Title Patient will transfer to toilet with mod assist, and stand safely to allow clothing management   Status On-going           OT Long Term Goals - 09/10/15 1604    OT LONG TERM GOAL #1   Title Patient and caregiver will independently complete HEP  for LUE with pain report no greater than 1-2/10   Status On-going   OT LONG TERM GOAL #2   Title Patient will transfer to toilet with min assist   Status On-going   OT LONG TERM GOAL #3   Title Patient will complete toilet hygiene with min assist   Status On-going   OT LONG TERM GOAL #4   Title Patient will roll from supine to/from sidelying with minimal assistance to reposition self in bed for comfort   Status On-going   OT LONG TERM GOAL #5   Title Patient will demonstrate 10 degree increase in shoulder flexion to aide in personal grooming, bathing and upper body dressing   Status On-going   OT LONG TERM GOAL #6   Title Patient will grasp and release 1-3 inch object, with left hand, with minimal assistance,  as part of bimanual ADL task   Status On-going               Plan - 09/13/15 1532    Clinical Impression Statement Patient with reduction in muscle tonus in left shoulder, elbow, and hand.  Patient with early pre-functional use of left upper extremity.   Pt will benefit from skilled therapeutic intervention in order to improve on the following deficits (Retired) Decreased activity tolerance;Decreased balance;Decreased coordination;Decreased  endurance;Impaired flexibility;Impaired perceived functional ability;Decreased safety awareness;Decreased range of motion;Decreased mobility;Decreased knowledge of use of DME;Decreased knowledge of precautions;Decreased strength;Increased edema;Increased muscle spasms;Impaired sensation;Pain;Impaired tone;Impaired UE functional use;Impaired vision/preception   Rehab Potential Good   Clinical Impairments Affecting Rehab Potential limited financial resources   OT Frequency 2x / week   OT Duration 8 weeks   OT Treatment/Interventions Self-care/ADL training;Electrical Stimulation;Cryotherapy;Moist Heat;Visual/perceptual remediation/compensation;Patient/family education;Balance training;Splinting;Therapeutic exercises;Therapeutic activities;Neuromuscular education;Functional Mobility Training;Fluidtherapy;Biofeedback;DME and/or AE instruction;Cognitive remediation/compensation;Ultrasound   Plan NMR LUE, functional mobility   OT Home Exercise Plan on going   Consulted and Agree with  Plan of Care Patient;Family member/caregiver   Family Member Consulted Daughter        Problem List Patient Active Problem List   Diagnosis Date Noted  . Seizures (HCC) 06/21/2015  . Hemorrhage, intracerebral (HCC) 02/03/2015  . Hemiparesis (HCC) 02/03/2015  . Spastic hemiplegia affecting nondominant side (HCC) 02/03/2015    Collier Salina, OTR/L 09/13/2015, 3:34 PM   [facility] [address] [place], [place], [address] Phone: [phone]   Fax:  [phone]  Name: Cristan Hout MRN: [id number] Date of Birth: 1940-12-29

## 2015-09-15 ENCOUNTER — Ambulatory Visit (INDEPENDENT_AMBULATORY_CARE_PROVIDER_SITE_OTHER): Payer: Self-pay | Admitting: Neurology

## 2015-09-15 ENCOUNTER — Encounter: Payer: Self-pay | Admitting: Occupational Therapy

## 2015-09-15 ENCOUNTER — Ambulatory Visit: Payer: Self-pay | Admitting: Occupational Therapy

## 2015-09-15 ENCOUNTER — Encounter: Payer: Self-pay | Admitting: Neurology

## 2015-09-15 ENCOUNTER — Ambulatory Visit: Payer: Self-pay | Admitting: Physical Therapy

## 2015-09-15 VITALS — BP 150/95 | HR 74 | Ht 63.0 in

## 2015-09-15 DIAGNOSIS — G8110 Spastic hemiplegia affecting unspecified side: Secondary | ICD-10-CM

## 2015-09-15 DIAGNOSIS — M25522 Pain in left elbow: Secondary | ICD-10-CM

## 2015-09-15 DIAGNOSIS — M25512 Pain in left shoulder: Secondary | ICD-10-CM

## 2015-09-15 DIAGNOSIS — R293 Abnormal posture: Secondary | ICD-10-CM

## 2015-09-15 DIAGNOSIS — R569 Unspecified convulsions: Secondary | ICD-10-CM

## 2015-09-15 DIAGNOSIS — R269 Unspecified abnormalities of gait and mobility: Secondary | ICD-10-CM

## 2015-09-15 DIAGNOSIS — I610 Nontraumatic intracerebral hemorrhage in hemisphere, subcortical: Secondary | ICD-10-CM | POA: Insufficient documentation

## 2015-09-15 NOTE — Patient Instructions (Addendum)
-   continue ASA for stroke prevention - continue keppra for seizure control.  - follow up PMR for botox injection ASAP for left spastic hemiplegia  - Follow up with your primary care physician for stroke risk factor modification. Recommend maintain blood pressure goal <130/80, diabetes with hemoglobin A1c goal below 6.5% and lipids with LDL cholesterol goal below 70 mg/dL.  - continue PT/OT and home exercise. Avoid over exertion - check BP at home. - follow up in 6 months.

## 2015-09-15 NOTE — Therapy (Signed)
[facility] [address] [place], [place], [address] Phone: [phone]   Fax:  [phone]  Physical Therapy Treatment  Patient Details  Name: Michelle Kemp MRN: [id number] Date of Birth: Oct 10, 1940 Referring Provider: Dr. Erlinda Hong  Encounter Date: 09/15/2015      PT End of Session - 09/15/15 2100    Visit Number 3   Number of Visits 17   Date for PT Re-Evaluation 11/09/15   Authorization Type Self pay   PT Start Time 1402   PT Stop Time 1445   PT Time Calculation (min) 43 min   Equipment Utilized During Treatment Gait belt   Activity Tolerance Patient limited by pain  standing, gait limited by pain in R ankle   Behavior During Therapy [facility] for tasks assessed/performed      Past Medical History  Diagnosis Date  . Stroke ([place])   . Arthritis   . Seizures [facility])     Past Surgical History  Procedure Laterality Date  . Hernia repair  2001    There were no vitals filed for this visit.  Visit Diagnosis:  Abnormality of gait  Spastic hemiplegia affecting nondominant side (HCC)  Postural instability      Subjective Assessment - 09/15/15 2047    Subjective Pt accompanied by granddaughter, Abby, today. Abby reports patient rolled R ankle when getting into car after last PT session.    Patient is accompained by: Family member  granddaughter, Abby   Pertinent History Seizures, pt's dtr reports pt is allergic to Delsym and OTC motrin   Patient Stated Goals To walk again    Currently in Pain? Yes   Pain Score 4    Pain Location Ankle   Pain Orientation Right   Pain Descriptors / Indicators Sore   Pain Type Acute pain   Pain Onset In the past 7 days   Pain Frequency Intermittent   Aggravating Factors  standing, walking   Pain Relieving Factors RLE NWB   Effect of Pain on Daily Activities limits standing, walking tolerance                         [facility] Adult PT Treatment/Exercise - 09/15/15  0001    Bed Mobility   Supine to Sit 4: Min guard   Sit to Supine 3: Mod assist   Sit to Supine - Details (indicate cue type and reason) Mod A for BLE management   Sit to Sidelying Right 4: Min assist;3: Mod assist   Sit to Sidelying Right Details (indicate cue type and reason) Initially required manual stabilization and mod A at hips. With demonstration cueing, pt able to perform bridging to scoot with superiorly and to R side with min A, manual stabilization of LLE; however, required mod A and max cueing for bridging to scoot to L side.   Transfers   Transfers Sit to Stand;Stand to Sit   Sit to Stand 2: Max assist   Sit to Stand Details (indicate cue type and reason) Sit > stand from EOM to L PFRW; cueing for anterior weight shift, full B hip extension   Stand to Sit 3: Mod assist   Ambulation/Gait   Ambulation/Gait Yes   Ambulation/Gait Assistance 3: Mod assist;1: +2 Total assist   Ambulation/Gait Assistance Details Gait x20' with L PFRW with one therapist providing assist/cueing for upright posture (B hip ext), lateral weight shifting (assist required to R > L), and to address turnk rotation to R.  SPT manually advanced RW. Quality and distance of gait trial limited by pain in R ankle.   Ambulation Distance (Feet) 20 Feet   Assistive device Left platform walker   Gait Pattern Step-to pattern;Decreased hip/knee flexion - left;Decreased step length - left;Decreased step length - right;Decreased stance time - left;Trunk rotated posteriorly on left;Trunk flexed;Decreased dorsiflexion - left;Wide base of support  L pelvis rotated posteriorly   Ambulation Surface Level;Indoor   Neuro Re-ed    Neuro Re-ed Details  Explained, demonstrated, and provided verbal/tactile cueing for all of the following exercies to address spasticity, hypertonicity in LLE: caregiver-assisted supine L gastrocnemius stretch 2 x60-sec holds; gravity-assisted L quadriceps self-stretch (caregiver providing cueing) 2 x60-sec  holds; and L hip/knee flexion with manual assist, tactile cueing at L distal hamstrings to increase selective control of L knee flexion with concurrent L hip flexion. Granddaughter gave effective return demo of all described exercises.                PT Education - 09/15/15 2046    Education provided Yes   Education Details HEP initiated; see Pt Instructions for details.   Person(s) Educated Patient;Child(ren)  granddaughter, Abby   Methods Explanation;Demonstration;Verbal cues;Handout;Tactile cues   Comprehension Verbalized understanding;Returned demonstration          PT Short Term Goals - 09/15/15 1406    PT SHORT TERM GOAL #1   Title The patient will perform HEP at MOD I level (with assist from family) in order to improve balance, strength, and flexibility. Target date: 10/08/15   Status On-going   PT SHORT TERM GOAL #2   Title The patient will consistently perform sit<>stand with min A and 25% cueing to indicate increased independence during transfers.  Modified Target date: 10/08/15   Baseline --   Status On-going   PT SHORT TERM GOAL #3   Title Assess bed mobility and write goal. Target date: 10/08/15   Baseline Met 3/8.   Status Achieved   PT SHORT TERM GOAL #4   Title Perform w/c to mat transfer and write goal. Target date: 10/08/15   Baseline Met 3/8.   Status Achieved   PT SHORT TERM GOAL #5   Title The patient will ambulate 47' with RW with max A and +1 assist to improve functional mobility.. Target date 10/08/15   Status On-going   Additional Short Term Goals   Additional Short Term Goals Yes   PT SHORT TERM GOAL #6   Title Pt will consistently perform sit > supine with Min A, min cueing for technique to indicate increased independence getting into bed.  Target date 10/08/15   Baseline 3/8: Mod A for BLE management   Status New   PT SHORT TERM GOAL #7   Title Pt will demo ability to bridge to scoot in all directions (superior, inferior, R/L) with supervision,  min cueing to indicate increased independence with bed mobility.    Target date 10/08/15   Status New   PT SHORT TERM GOAL #8   Title Pt will perform stand pivot transfer from w/c <>  mat with Mod  A to decrease physical burden on caregivers.   Target date 10/08/15   Baseline 3/8: stand pivot transfer from w/c > mat table with Max A, no AD.   Status New           PT Long Term Goals - 09/15/15 2114    PT LONG TERM GOAL #1   Title Pt will amb. 75' with RW and  mod A to improve functional mobility. Target date: 11/05/15   Status On-going   PT LONG TERM GOAL #2   Title Pt will perform seated scooting in to L and R with supervision, min cueing to increase pt independence with functional mobility. Target date: 11/05/15   Status New   PT LONG TERM GOAL #3   Title The patient will perform sit <> stand with CGA and RW to improve functional mobility. Target date: 11/05/15    Status On-going   PT LONG TERM GOAL #4   Title Pt will perform stand pivot transfer from w/c <>  mat with Min A to decrease physical burden on caregivers.   Target date: 11/05/15    Status New   PT LONG TERM GOAL #5   Title Pt will consistently perform supine > sit with supervision, min cueing for technique to indicate increased independence getting out of bed.  Target date: 11/05/15    Status New   Additional Long Term Goals   Additional Long Term Goals Yes   PT LONG TERM GOAL #6   Title Pt will consistently perform sit > supine with supervision, min cueing for technique to indicate increased independence getting into bed.  Target date: 11/05/15    Baseline --   Status New   PT LONG TERM GOAL #8   Baseline --               Plan - 09/15/15 2101    Clinical Impression Statement Session focused on initiating home stretching program and educating family on facilitating pt independence with mobility. Quality and distance of gait limited by pain in R ankle, which granddaughter attributes to pt having rolled R ankle when  getting into car after last PT session. Short and long term goals added for bed mobility, scooting (supine and seated), and functional transfers.   Pt will benefit from skilled therapeutic intervention in order to improve on the following deficits Abnormal gait;Decreased balance;Decreased mobility;Decreased strength;Postural dysfunction;Impaired flexibility;Pain;Decreased activity tolerance;Decreased endurance;Increased muscle spasms;Difficulty walking;Impaired tone   Rehab Potential Good   Clinical Impairments Affecting Rehab Potential patient private pay for services   PT Frequency 2x / week   PT Duration 8 weeks   PT Treatment/Interventions Electrical Stimulation;Gait training;Stair training;Neuromuscular re-education;Balance training;Therapeutic exercise;Therapeutic activities;Functional mobility training;Patient/family education;Wheelchair mobility training;Manual techniques;Orthotic Fit/Training;ADLs/Self Care Home Management;Biofeedback;DME Instruction   PT Next Visit Plan * Provide paper handout for HEP (see Pt Instructions on 3/8). NMR with focus on scooting (anterior, R/L). Stand pivot transfers. Gait training with L PFRW.   Consulted and Agree with Plan of Care Patient;Family member/caregiver   Family Member Consulted grandaughter, Abby        Problem List Patient Active Problem List   Diagnosis Date Noted  . 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 Seizures ([place]) 06/21/2015  . Hemorrhage, intracerebral ([place]) 02/03/2015  . Hemiparesis ([place]) 02/03/2015  . Spastic hemiplegia affecting nondominant side ([place]) 02/03/2015    Billie Ruddy, PT, DPT [facility] [address] [place], [place], [address] Phone: [phone]   Fax:  [phone] 09/15/2015, 9:23 PM  Name: Michelle Kemp MRN: [id number] Date of Birth: [birth year]

## 2015-09-15 NOTE — Progress Notes (Signed)
STROKE NEUROLOGY FOLLOW UP NOTE  NAME: Michelle Kemp DOB: February 25, 1941  REASON FOR VISIT: stroke follow up HISTORY FROM: daughter and chart  Today we had the pleasure of seeing [name] in follow-up at our Neurology Clinic. Pt was accompanied by daughter. Daughter acted as Equities trader.    History Summary 75 yo female from [place] with hx of arthritis is following up in stroke clinic for stroke. As per daughter, she was having vacation in [place] on 11/07/14 when she developed acute onset left arm and leg weakness, left leg stiff and spastic, with facial droop, left side drooling, and slurry speech. She was sent to nearby hospital, BP 200/120 and CT head on 11/13/14 showed right frontal hemorrhage. She was treated conservatively. 12/13/14 had MRA head showed diffuse intracranial atherosclerosis. 01/11/15 repeat CT showed near resolution of ICH. She was then travelled back from [place] to [place]. She denies any smoking, alcohol or illicit drugs.  Follow up 04/12/15 - she has been working with PT/OT for strength training. She saw Dr. Lucia Gaskins for follow up, has ordered MRI and MRA but not done due to insurance coverage. Daughter has been working with Child psychotherapist to get her disability approved.  She still has spastic left arm and leg, but also developed left shoulder, elbow, hip and knee pain which limited her rehab process.   ED visit 05/16/15 - as per chart, patient sent to ED for stroke evaluation. MRI showed no acute stroke. Patient sent back to home. However, asking for daughter, she stated that patient home starting have left facial twitching, and left arm and leg shaking jerking, no loss of the bowel bladder function, no loss of consciousness. When EMS came over, symptoms resolving, with increased weakness on the left arm and leg. In ED, patient back to her baseline. Patient was sent back home without medication changes.  06/21/15 follow up - patient had been doing the same. Her left arm and leg stiffness  since getting worse, decreased range of motion on shoulder, elbow and knee. With overstretching, patient started to feel pain in joints. Her MRI in November showed chronic right frontal ICH, no CAA, did not favor hemorrhagic transformation. She had MRA done today showed diffuse intracranial stenosis. 2-D Echo unremarkable. Carotid Doppler not done yet. Had recent lab with PCP, was told everything is good. Blood pressure at home ranging from 112-142. Today in clinic 124/90. Patient had PT OT twice a week.  Interval History During the interval time, pt has been doing well. No recurrent seizure or stroke. Continued with PT/OT twice a week and also followed with Dr. Wynn Banker for botox injection and rehab management. She left LE strength improved and able to walk with walker briefly at home. Daughter showed me her video at home. However, for the last session of PT, she started to have left knee and hip pain, therefore today she came in with wheelchair. Her left UE still has spastic paralysis, not able to move with limited ROM of left shoulder. Her CUS and EEG were negative and A1C 4.7 with LDL 107 in 04/2015. Today BP 150/95. She is on ASA.   REVIEW OF SYSTEMS: Full 14 system review of systems performed and notable only for those listed below and in HPI above, all others are negative:  Constitutional:  Appetite change Cardiovascular: Leg swelling Ear/Nose/Throat:   Skin:  Eyes:  Eye discharge Respiratory:   Gastroitestinal:   Genitourinary:  Hematology/Lymphatic:   Endocrine:  Musculoskeletal:  Joint pain, back pain and neck pain  Allergy/Immunology:   Neurological:  seizure Psychiatric:  Sleep: Snoring  The following represents the patient's updated allergies and side effects list: No Known Allergies  The neurologically relevant items on the patient's problem list were reviewed on today's visit.  Neurologic Examination  A problem focused neurological exam (12 or more points of the single  system neurologic examination, vital signs counts as 1 point, cranial nerves count for 8 points) was performed.  Blood pressure 150/95, pulse 74, height 5\' 3"  (1.6 m).  General - Well nourished, well developed, in no apparent distress.  Ophthalmologic - Fundi not visualized due to noncooperation.  Cardiovascular - intermittent premature beat.  Mental Status -  Level of arousal and orientation to time, place, and person were intact. Language including expression, naming, repetition, comprehension was assessed and found intact. Fund of Knowledge was assessed and was impaired.  Cranial Nerves II - XII - II - Visual field intact OU. III, IV, VI - Extraocular movements intact. V - Facial sensation intact bilaterally. VII - mild left nasolabial fold flattening. VIII - Hearing & vestibular intact bilaterally. X - Palate elevates symmetrically. XI - Chin turning & shoulder shrug intact bilaterally. XII - Tongue protrusion intact.  Motor Strength - The patient's strength was LUE 0/5 proximal and 0/5 distal, LLE 3-/5 proximal and 3/5 knee extension and 0/5 distally.  Bulk was normal and fasciculations were absent.   Motor Tone - Muscle tone was assessed at the neck and appendages and was significantly increased left UE and LE. Limited ROM of left shoulder and elbow, as well as knee.   Reflexes - The patient's reflexes were 2+ in all extremities and she had no pathological reflexes.  Sensory - Light touch, temperature/pinprick were assessed and were normal.    Coordination - The patient had normal movements in the hands with no ataxia or dysmetria.  Tremor was absent.  Gait and Station - in wheelchair, gait not tested.  Data reviewed: I personally reviewed the images and agree with the radiology interpretations.  11/13/14 CT head - right frontal hemorrhage with remote small left cerebellar infarct  12/13/14 MRA head - diffuse intracranial atherosclerosis  01/11/15 CT head - resolving right  frontal hemorrhage with remote small left cerebellar infarct.  MRI brain 05/16/2015 1. No acute infarct identified. 2. Posterior right frontal lobe encephalomalacia with internal complex fluid, favored to be hemorrhage with unresolved extracellular methemoglobin. Surrounding gliosis. No associated mass effect. 3. Intracranial artery dolichoectasia. Chronic lacunar infarcts Elsewhere.  MRA head 06/21/2015 1. Possible mild stenosis within the M2 segment of the right middle cerebral artery. This is less apparent on the source images and could be artifactual. 2. Reduced flow of the left vertebral artery. No focal stenosis is noted. If clinically indicated, consider MR angiogram of the neck arteries to assess the origin of the vertebral artery. 3. Small regions of focal stenosis within the right posterior cerebral artery. 4. No aneurysms were identified.  2-D echo 05/14/2015 - Left ventricle: The cavity size was normal. Systolic function was normal. The estimated ejection fraction was in the range of 60% to 65%. Wall motion was normal; there were no regional wall motion abnormalities. Doppler parameters are consistent with abnormal left ventricular relaxation (grade 1 diastolic dysfunction). There was no evidence of elevated ventricular filling pressure by Doppler parameters. - Aortic valve: There was mild regurgitation. - Aortic root: The aortic root was normal in size. - Mitral valve: Structurally normal valve. There was mild regurgitation. - Left atrium: The atrium was  normal in size. - Right ventricle: The cavity size was normal. Wall thickness was normal. Systolic function was normal. - Right atrium: The atrium was normal in size. - Tricuspid valve: There was trivial regurgitation. - Pulmonic valve: There was trivial regurgitation. - Pulmonary arteries: Systolic pressure was within the normal range. - Inferior vena cava: The vessel was normal in size. -  Pericardium, extracardiac: There was no pericardial effusion.  Labs 04/27/15 A1C 4.7 Total Choles 193 LDL 107 HDL 71 TG 75 Cre 0.82  Assessment: As you may recall, she is a 75 y.o. African American female with PMH of arthritis had acute onset left sided weakness and dysarthria on 11/07/14. CT was done on 11/13/14 showed right frontal ICH. She was treated conservatively. MRA in 12/2014 showed diffuse atherosclerosis. Repeat CT head in 01/2015 showed near resolution of ICH with remote small left cerebellar infarct. Due to the delay of CT as well as without MRI image, it is hard to tell for sure it was ICH vs. Hemorrhagic infarct. Since the hematoma is absorbed, will start her on ASA 81. Had ED visit on 05/16/2015 for left facial twitching and left arm and leg shaking jerking lasting short period time, concerning first simple partial seizure. MRI brain favor chronic right frontal ICH. MRA head again showed diffuse intracranial atherosclerosis. TTE, CUS and EEG unremarkable, A1C 4.7 and LDL 107. Continue PT/OT and follow up with Dr. Wynn Banker for botox and left sided spasticity post stroke. Continue Keppra for seizure control.  Plan:  - continue ASA for stroke prevention - continue keppra for seizure control.  - due to potential side effect of statin to impede her rehab, will consider statin in the future. - follow up with Dr. Wynn Banker for botox injection and left spastic hemiplegia  - Follow up with your primary care physician for stroke risk factor modification. Recommend maintain blood pressure goal <130/80, diabetes with hemoglobin A1c goal below 6.5% and lipids with LDL cholesterol goal below 70 mg/dL.  - continue PT/OT and home exercise. Avoid over exertion. - check BP at home. - follow up in 6 months.  A total of 25 minutes was spent face-to-face with this patient. Over half this time was spent on counseling patient on the seizure prevention and continued PT OT.   No orders of the defined  types were placed in this encounter.    No orders of the defined types were placed in this encounter.    Patient Instructions  - continue ASA for stroke prevention - continue keppra for seizure control.  - follow up PMR for botox injection ASAP for left spastic hemiplegia  - Follow up with your primary care physician for stroke risk factor modification. Recommend maintain blood pressure goal <130/80, diabetes with hemoglobin A1c goal below 6.5% and lipids with LDL cholesterol goal below 70 mg/dL.  - continue PT/OT and home exercise. Avoid over exertion - check BP at home. - follow up in 6 months.    Marvel Plan, MD PhD [facility] [address], [address] [place], [place] [address] [phone]  L

## 2015-09-15 NOTE — Patient Instructions (Addendum)
Flexion: ROM With Knee Flexion (Supine)  FLEXION: Supine (Active - Assistance)    Start with Michelle Kemp lying on back, legs straight. Caregiver, provide assistance behind the LEFT knee, as patient bends left knee up towards chest. Provide less assistance with each repetition. Complete _10_ reps, _2_ times per day.  Copyright  VHI. All rights reserved.   Dorsiflexion: Stretch - Heel Cord / Gastrocnemius    Position (A) Patient: Lie or sit with knee straight. Helper: Cup left heel. Make sure grip is firm. Motion (B) - Helper uses forearm to apply pressure to entire sole of foot, stretching foot toward shin. CAUTION: Stretch should be felt in calf. Do not allow foot to twist. Hold _60_ seconds. Repeat __3_ times on the LEFT leg.   Hip Flexor Stretch    Have Michelle Kemp lie on back with LEFT leg near edge of bed. Bend RIGHT knee. Hang LEFT leg over edge, relaxed, thigh off edge of bed for  __2__ minutes. She should feel a gentle stretch in the front of her thigh.  As Michelle Kemp becomes more flexible, place her foot on the floor (or a step stool) and bend knee, to her tolerance.   Perform this stretch __2__ times per day on the LEFT leg.  Extension: Stabilized Bridging (Supine)    1. With caregiver placing hands on LEFT foot and knee (to stabilize), have Michelle Kemp perform bridging 10 reps, 2-3 times per day. 2. Use bridging to increase Michelle Kemp's independence with dressing, hygiene, and scooting right, left, and to the head of the bed. Be sure to stabilize left leg (as pictured). * If Michelle Kemp has difficulty performing this motion, try demonstrating it for her.  Scooting    Michelle Kemp should perform this exercise sitting on the edge of her bed with feet touching the floor with caregiver sitting in front of her at all times. Cue Michelle Kemp to "walk" forward to front by shifting weight from buttock to buttock. Repeat going backward.    Use this scooting technique to increase Michelle Kemp's independence with  setting up transfers (for example, scooting to edge of wheelchair seat).

## 2015-09-16 NOTE — Therapy (Signed)
[facility] [address] [place], [place], [address] Phone: [phone]   Fax:  [phone]  Occupational Therapy Treatment  Patient Details  Name: Michelle Kemp MRN: [id number] Date of Birth: 1940-07-14 Referring Provider: Dr Marvel Plan  Encounter Date: 09/15/2015      OT End of Session - 09/16/15 1024    Visit Number 4   Number of Visits 17   Date for OT Re-Evaluation 11/05/15   Authorization Type self pay   OT Start Time 1447   OT Stop Time 1530   OT Time Calculation (min) 43 min   Activity Tolerance Patient limited by pain  treated mostly supine due to foot pain   Behavior During Therapy [facility] for tasks assessed/performed      Past Medical History  Diagnosis Date  . Stroke (HCC)   . Arthritis   . Seizures [facility])     Past Surgical History  Procedure Laterality Date  . Hernia repair  2001    There were no vitals filed for this visit.  Visit Diagnosis:  Spastic hemiplegia affecting nondominant side (HCC)  Postural instability  Pain, elbow joint, left  Pain in joint, shoulder region, left      Subjective Assessment - 09/15/15 1524    Subjective  Patent reports pain in right foot with stepping, after hurting foot two days ago while stransferring with daughter.   Patient is accompained by: Family member   Currently in Pain? Yes   Pain Score 4    Pain Location Foot   Pain Orientation Right                      OT Treatments/Exercises (OP) - 09/15/15 1016    ADLs   Functional Mobility Patient with pain in forefoot (not ankle) when stepping today, so transferred wheelchair to/from mat table using squat pivot method.  Patient unable to sufficiently weight shift forward for transfer, but with minimal guiding assistance, able to transfer to either side,     Neurological Re-education Exercises   Other Exercises 1 Neuromuscular reeducation to address left shoulder motion.  Patient with  active guarding today, needed mod cueing and facilitation to achieve greater motion in shoulder joint.  Patient able to achieve `130 degrees of paassive shoulder felxion and abduction.  Patient with limited ability to activate shoulder flex/abduction, yet able to actively realx out of preferred synergistic pattern with cueing.                  OT Education - 09/16/15 1022    Education provided Yes   Education Details Granddaughter instructed to seek medical attention for patient if foot pain, swelling persists.  Dorsum of right foot swollen, and painful with weight translation during stepping, after hurting foot two days prior.  Patient seeing MD 3/8 at 4pm - instructed to mention foot problem   Person(s) Educated Patient;Caregiver(s)   Methods Explanation   Comprehension Verbalized understanding          OT Short Term Goals - 09/10/15 1603    OT SHORT TERM GOAL #1   Title Patient will report no greater than 3/10 pain with elbow flexion and extension in HEP , due 10/05/14   Status On-going   OT SHORT TERM GOAL #2   Title Patient will demonstrate 5 degree increase in shoulder flexion to aide personal care with upper body bathing, and dressing   Status On-going   OT SHORT TERM GOAL #3   Title  Patient will assist caregiver in left arm management when rolling from supine to sidelying on right side with visual / verbal cueing.   Status On-going   OT SHORT TERM GOAL #4   Title Patient will transition from sit to stand from chair without pulling up on surface with right arm to aide in repositioning self for comfort when alone in apartment during the afternoon   Status On-going   OT SHORT TERM GOAL #5   Title Patient will transfer to toilet with mod assist, and stand safely to allow clothing management   Status On-going           OT Long Term Goals - 09/10/15 1604    OT LONG TERM GOAL #1   Title Patient and caregiver will independently complete HEP  for LUE with pain report no  greater than 1-2/10   Status On-going   OT LONG TERM GOAL #2   Title Patient will transfer to toilet with min assist   Status On-going   OT LONG TERM GOAL #3   Title Patient will complete toilet hygiene with min assist   Status On-going   OT LONG TERM GOAL #4   Title Patient will roll from supine to/from sidelying with minimal assistance to reposition self in bed for comfort   Status On-going   OT LONG TERM GOAL #5   Title Patient will demonstrate 10 degree increase in shoulder flexion to aide in personal grooming, bathing and upper body dressing   Status On-going   OT LONG TERM GOAL #6   Title Patient will grasp and release 1-3 inch object, with left hand, with minimal assistance,  as part of bimanual ADL task   Status On-going               Plan - 09/16/15 1024    Clinical Impression Statement Patient with new report of right foot pain today, limiting functional mobility.  Patient with steady improvement in passive motion available in left UE.     Pt will benefit from skilled therapeutic intervention in order to improve on the following deficits (Retired) Decreased activity tolerance;Decreased balance;Decreased coordination;Decreased endurance;Impaired flexibility;Impaired perceived functional ability;Decreased safety awareness;Decreased range of motion;Decreased mobility;Decreased knowledge of use of DME;Decreased knowledge of precautions;Decreased strength;Increased edema;Increased muscle spasms;Impaired sensation;Pain;Impaired tone;Impaired UE functional use;Impaired vision/preception   Rehab Potential Good   Clinical Impairments Affecting Rehab Potential limited financial resources   OT Frequency 2x / week   OT Duration 8 weeks   OT Treatment/Interventions Self-care/ADL training;Electrical Stimulation;Cryotherapy;Moist Heat;Visual/perceptual remediation/compensation;Patient/family education;Balance training;Splinting;Therapeutic exercises;Therapeutic activities;Neuromuscular  education;Functional Mobility Training;Fluidtherapy;Biofeedback;DME and/or AE instruction;Cognitive remediation/compensation;Ultrasound   Plan nmr, functional mobility, sit to stand with decreased UE support   OT Home Exercise Plan on going   Consulted and Agree with Plan of Care Patient;Family member/caregiver   Family Member Consulted grand-daughter        Problem List Patient Active Problem List   Diagnosis Date Noted  . 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 Seizures (HCC) 06/21/2015  . Hemorrhage, intracerebral (HCC) 02/03/2015  . Hemiparesis (HCC) 02/03/2015  . Spastic hemiplegia affecting nondominant side (HCC) 02/03/2015    Collier SalinaGellert, Kristin M, OTR/L 09/16/2015, 10:27 AM  [facility] [address] [place], [place], [address] Phone: [phone]   Fax:  [phone]  Name: Michelle Kemp MRN: [id number] Date of Birth: 03/20/1941

## 2015-09-20 ENCOUNTER — Ambulatory Visit: Payer: Self-pay | Admitting: Occupational Therapy

## 2015-09-20 DIAGNOSIS — R293 Abnormal posture: Secondary | ICD-10-CM

## 2015-09-20 DIAGNOSIS — R2681 Unsteadiness on feet: Secondary | ICD-10-CM

## 2015-09-20 DIAGNOSIS — G8110 Spastic hemiplegia affecting unspecified side: Secondary | ICD-10-CM

## 2015-09-21 ENCOUNTER — Encounter: Payer: Self-pay | Admitting: Occupational Therapy

## 2015-09-21 NOTE — Therapy (Signed)
[facility] [address] [place], [place], [address] Phone: [phone]   Fax:  [phone]  Occupational Therapy Treatment  Patient Details  Name: Michelle Kemp MRN: [id number] Date of Birth: October 01, 1940 Referring Provider: Dr Marvel Plan  Encounter Date: 09/20/2015      OT End of Session - 09/21/15 0613    Visit Number 5   Number of Visits 17   Date for OT Re-Evaluation 11/05/15   Authorization Type self pay   OT Start Time 1530   OT Stop Time 1615   OT Time Calculation (min) 45 min   Activity Tolerance Patient tolerated treatment well   Behavior During Therapy [facility] for tasks assessed/performed      Past Medical History  Diagnosis Date  . Stroke (HCC)   . Arthritis   . Seizures [facility])     Past Surgical History  Procedure Laterality Date  . Hernia repair  2001    There were no vitals filed for this visit.  Visit Diagnosis:  Unsteadiness on feet  Spastic hemiplegia affecting nondominant side (HCC)  Postural instability      Subjective Assessment - 09/21/15 0602    Subjective  Patient reports arm is much more relaxed in bed   Currently in Pain? No/denies   Pain Score 0-No pain                      OT Treatments/Exercises (OP) - 09/21/15 0001    ADLs   Functional Mobility Patient desires to stand from chair at home to adjust position during the day.  Worked on sit to stand - forward translation of weight to achieve effective stand balance (static)  Patient leaning (pushing) toward weaker  left side and losing balance posteriorly initially.  Patient's tendency is to use right arm to pull self forward.  Worked with patient to shift body forward without use, or with minimal use of right arm.  Patient initially fearful, but with repetition, able to stand without support for up to 16 seconds.  Patient very pleased with performance.     Cooking Daughter reports that patient able to help her in  the kitchen today, chopping okra.  Patient used left hand to stabilize vegetable while gently chopping with right hand..  Encouraged tasks that allow patient to utilize left hand and arm in a functional manner, as patient often inattentive to left side.    Neurological Re-education Exercises   Other Exercises 1 Neuromuscular reeducation to address hand and elbow coordination.  In sidelying, working to pinch and release small object (1" cube).  Patient able to activate flexors for pinch, and more challenged to actively release block.  Encouraged patient to use vision to determine success.  Patient needed frequent cues to watch activity in left hand.  Once left hand active for pinch, release, worked on pre hand to mouth pattern with object.  Patient now able to illicit elbow flexion and release into extension with minimal cueing.  Use of visual attention is very helpful in allowing patient to see successful attempts.                  OT Education - 09/21/15 0612    Education provided Yes   Education Details Use of visual attention to aide with active movement / function in left hand   Person(s) Educated Patient;Child(ren)   Methods Explanation;Demonstration;Verbal cues   Comprehension Verbalized understanding;Returned demonstration;Verbal cues required;Need further instruction  OT Short Term Goals - 09/10/15 1603    OT SHORT TERM GOAL #1   Title Patient will report no greater than 3/10 pain with elbow flexion and extension in HEP , due 10/05/14   Status On-going   OT SHORT TERM GOAL #2   Title Patient will demonstrate 5 degree increase in shoulder flexion to aide personal care with upper body bathing, and dressing   Status On-going   OT SHORT TERM GOAL #3   Title Patient will assist caregiver in left arm management when rolling from supine to sidelying on right side with visual / verbal cueing.   Status On-going   OT SHORT TERM GOAL #4   Title Patient will transition from sit  to stand from chair without pulling up on surface with right arm to aide in repositioning self for comfort when alone in apartment during the afternoon   Status On-going   OT SHORT TERM GOAL #5   Title Patient will transfer to toilet with mod assist, and stand safely to allow clothing management   Status On-going           OT Long Term Goals - 09/10/15 1604    OT LONG TERM GOAL #1   Title Patient and caregiver will independently complete HEP  for LUE with pain report no greater than 1-2/10   Status On-going   OT LONG TERM GOAL #2   Title Patient will transfer to toilet with min assist   Status On-going   OT LONG TERM GOAL #3   Title Patient will complete toilet hygiene with min assist   Status On-going   OT LONG TERM GOAL #4   Title Patient will roll from supine to/from sidelying with minimal assistance to reposition self in bed for comfort   Status On-going   OT LONG TERM GOAL #5   Title Patient will demonstrate 10 degree increase in shoulder flexion to aide in personal grooming, bathing and upper body dressing   Status On-going   OT LONG TERM GOAL #6   Title Patient will grasp and release 1-3 inch object, with left hand, with minimal assistance,  as part of bimanual ADL task   Status On-going               Plan - 09/21/15 [id number]    Clinical Impression Statement Patient with improved control of tension in left arm in supported positions.  When mobility is challenged, standing and walking, increase tension noted in synergistic pattern in left UE   Pt will benefit from skilled therapeutic intervention in order to improve on the following deficits (Retired) Decreased activity tolerance;Decreased balance;Decreased coordination;Decreased endurance;Impaired flexibility;Impaired perceived functional ability;Decreased safety awareness;Decreased range of motion;Decreased mobility;Decreased knowledge of use of DME;Decreased knowledge of precautions;Decreased strength;Increased  edema;Increased muscle spasms;Impaired sensation;Pain;Impaired tone;Impaired UE functional use;Impaired vision/preception   Rehab Potential Good   Clinical Impairments Affecting Rehab Potential limited financial resources   OT Frequency 2x / week   OT Duration 8 weeks   OT Treatment/Interventions Self-care/ADL training;Electrical Stimulation;Cryotherapy;Moist Heat;Visual/perceptual remediation/compensation;Patient/family education;Balance training;Splinting;Therapeutic exercises;Therapeutic activities;Neuromuscular education;Functional Mobility Training;Fluidtherapy;Biofeedback;DME and/or AE instruction;Cognitive remediation/compensation;Ultrasound   Plan NMR LUE, static to dynamic stand balance, sit to stand with decreased UE support   OT Home Exercise Plan on going   Consulted and Agree with Plan of Care Patient;Family member/caregiver   Family Member Consulted daughter [name]- Julie        Problem List Patient Active Problem List   Diagnosis Date Noted  . 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 Seizures (HCC) 06/21/2015  . Hemorrhage, intracerebral (HCC) 02/03/2015  . Hemiparesis (HCC) 02/03/2015  . Spastic hemiplegia affecting nondominant side (HCC) 02/03/2015    Collier Salina, OTR/L 09/21/2015, 6:16 AM  [facility] [address] [place], [place], [address] Phone: [phone]   Fax:  [phone]  Name: Decarla Siemen MRN: [id number] Date of Birth: 03-16-1941

## 2015-09-24 ENCOUNTER — Ambulatory Visit: Payer: Self-pay | Admitting: Physical Therapy

## 2015-09-24 ENCOUNTER — Ambulatory Visit: Payer: Self-pay | Admitting: Occupational Therapy

## 2015-09-24 DIAGNOSIS — R269 Unspecified abnormalities of gait and mobility: Secondary | ICD-10-CM

## 2015-09-24 DIAGNOSIS — G8110 Spastic hemiplegia affecting unspecified side: Secondary | ICD-10-CM

## 2015-09-24 DIAGNOSIS — M6281 Muscle weakness (generalized): Secondary | ICD-10-CM

## 2015-09-24 DIAGNOSIS — R2681 Unsteadiness on feet: Secondary | ICD-10-CM

## 2015-09-24 NOTE — Patient Instructions (Signed)
Flexion: ROM With Knee Flexion (Supine)  FLEXION: Supine (Active - Assistance)    Start with Jinnifer lying on back, legs straight. Caregiver, provide assistance behind the LEFT knee, as patient bends left knee up towards chest. Provide less assistance with each repetition. Complete _10_ reps, _2_ times per day.  Copyright  VHI. All rights reserved.   Dorsiflexion: Stretch - Heel Cord / Gastrocnemius    Position (A) Patient: Lie or sit with knee straight. Helper: Cup left heel. Make sure grip is firm. Motion (B) - Helper uses forearm to apply pressure to entire sole of foot, stretching foot toward shin. CAUTION: Stretch should be felt in calf. Do not allow foot to twist. Hold _60_ seconds. Repeat __3_ times on the LEFT leg.   Hip Flexor Stretch    Have Skylynne lie on back with LEFT leg near edge of bed. Bend RIGHT knee. Hang LEFT leg over edge, relaxed, thigh off edge of bed for  __2__ minutes. She should feel a gentle stretch in the front of her thigh.  As Kobe becomes more flexible, place her foot on the floor (or a step stool) and bend knee, to her tolerance.   Perform this stretch __2__ times per day on the LEFT leg.  Extension: Stabilized Bridging (Supine)    1. With caregiver placing hands on LEFT foot and knee (to stabilize), have Salimatou perform bridging 10 reps, 2-3 times per day. 2. Use bridging to increase Amyra's independence with dressing, hygiene, and scooting right, left, and to the head of the bed. Be sure to stabilize left leg (as pictured). * If Avriana has difficulty performing this motion, try demonstrating it for her.  Scooting    Makira should perform this exercise sitting on the edge of her bed with feet touching the floor with caregiver sitting in front of her at all times. Cue Tena to "walk" forward to front by shifting weight from buttock to buttock. Repeat going backward.    Use this scooting technique to increase Micca's independence with  setting up transfers (for example, scooting to edge of wheelchair seat).  

## 2015-09-24 NOTE — Therapy (Signed)
[facility] [address] [place], [place], [address] Phone: [phone]   Fax:  [phone]  Occupational Therapy Treatment  Patient Details  Name: Michelle Abedkuwavi Bostelman MRN: [id number] Date of Birth: 08/28/1940 Referring Provider: Dr Marvel PlanJindong Xu  Encounter Date: 09/24/2015      OT End of Session - 09/24/15 1518    Authorization Type self pay   OT Start Time 1445   OT Stop Time 1515   OT Time Calculation (min) 30 min      Past Medical History  Diagnosis Date  . Stroke (HCC)   . Arthritis   . Seizures [facility](HCC)     Past Surgical History  Procedure Laterality Date  . Hernia repair  2001    There were no vitals filed for this visit.  Visit Diagnosis:  Spastic hemiplegia affecting nondominant side (HCC)   Patient unable to participate in therapy today.  Patient reports knee pain left and is very tearful - worried about being a burden to her family. Reviewed schedule of next appointments.                           OT Short Term Goals - 09/10/15 1603    OT SHORT TERM GOAL #1   Title Patient will report no greater than 3/10 pain with elbow flexion and extension in HEP , due 10/05/14   Status On-going   OT SHORT TERM GOAL #2   Title Patient will demonstrate 5 degree increase in shoulder flexion to aide personal care with upper body bathing, and dressing   Status On-going   OT SHORT TERM GOAL #3   Title Patient will assist caregiver in left arm management when rolling from supine to sidelying on right side with visual / verbal cueing.   Status On-going   OT SHORT TERM GOAL #4   Title Patient will transition from sit to stand from chair without pulling up on surface with right arm to aide in repositioning self for comfort when alone in apartment during the afternoon   Status On-going   OT SHORT TERM GOAL #5   Title Patient will transfer to toilet with mod assist, and stand safely to allow clothing  management   Status On-going           OT Long Term Goals - 09/10/15 1604    OT LONG TERM GOAL #1   Title Patient and caregiver will independently complete HEP  for LUE with pain report no greater than 1-2/10   Status On-going   OT LONG TERM GOAL #2   Title Patient will transfer to toilet with min assist   Status On-going   OT LONG TERM GOAL #3   Title Patient will complete toilet hygiene with min assist   Status On-going   OT LONG TERM GOAL #4   Title Patient will roll from supine to/from sidelying with minimal assistance to reposition self in bed for comfort   Status On-going   OT LONG TERM GOAL #5   Title Patient will demonstrate 10 degree increase in shoulder flexion to aide in personal grooming, bathing and upper body dressing   Status On-going   OT LONG TERM GOAL #6   Title Patient will grasp and release 1-3 inch object, with left hand, with minimal assistance,  as part of bimanual ADL task   Status On-going               Problem List Patient  Active Problem List   Diagnosis Date Noted  . 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 Seizures (HCC) 06/21/2015  . Hemorrhage, intracerebral (HCC) 02/03/2015  . Hemiparesis (HCC) 02/03/2015  . Spastic hemiplegia affecting nondominant side (HCC) 02/03/2015    Collier Salina 09/24/2015, 3:20 PM  [facility] [address] [place], [place], [address] Phone: [phone]   Fax:  [phone]  Name: Michelle Kemp MRN: [id number] Date of Birth: 06/16/1941

## 2015-09-25 NOTE — Therapy (Signed)
[place] [address] [place], [place], [address] Phone: [phone]   Fax:  [phone]  Physical Therapy Treatment  Patient Details  Name: Michelle Kemp MRN: [id number] Date of Birth: 24-Feb-1941 Referring Provider: Dr. Erlinda Hong  Encounter Date: 09/24/2015      PT End of Session - 09/24/15 1614    Visit Number 4   Number of Visits 17   Date for PT Re-Evaluation 11/09/15   Authorization Type Self pay   PT Start Time 1408   PT Stop Time 1445   PT Time Calculation (min) 37 min   Equipment Utilized During Treatment Gait belt;Other (comment)  standing frame   Activity Tolerance Patient limited by pain   Behavior During Therapy [facility] for tasks assessed/performed      Past Medical History  Diagnosis Date  . Stroke ([place])   . Arthritis   . Seizures [facility])     Past Surgical History  Procedure Laterality Date  . Hernia repair  2001    There were no vitals filed for this visit.  Visit Diagnosis:  Spastic hemiplegia affecting nondominant side (HCC)  Unsteadiness on feet  Muscle weakness  Abnormality of gait      Subjective Assessment - 09/24/15 1559    Subjective Pt reports generalized "stiffness all over" today. Not wearing shoes or L AFO due to ongoing swelling in RLE (since pt sustained suspect R ankle sprain).    Patient is accompained by: Family member  grandaughter, Michelle Kemp   Pertinent History Seizures, pt's dtr reports pt is allergic to Delsym and OTC motrin   Patient Stated Goals To walk again    Currently in Pain? Yes   Pain Score 3   3/10 on FLACC scale   Pain Location Generalized  Generalized initially; later reported discomfort in back and L knee while walking   Pain Orientation Right;Left   Pain Descriptors / Indicators Sore   Pain Type Chronic pain   Pain Frequency Intermittent   Aggravating Factors  walking   Pain Relieving Factors sitting   Effect of Pain on Daily Activities limits  tolerance to standing and walking   Multiple Pain Sites No                         [facility] Adult PT Treatment/Exercise - 09/25/15 0001    Transfers   Transfers Sit to Stand;Stand to Sit   Sit to Stand 3: Mod assist;4: Min assist   Stand to Sit 3: Mod assist;4: Min assist   Ambulation/Gait   Ambulation/Gait Yes   Ambulation/Gait Assistance 1: +2 Total assist;2: Max assist   Ambulation Distance (Feet) 27 Feet  total   Assistive device Rolling walker;2 person hand held assist;Other (Comment)  RW with L h/o   Gait Pattern Step-to pattern;Decreased hip/knee flexion - left;Decreased step length - left;Decreased step length - right;Decreased stance time - left;Trunk rotated posteriorly on left;Trunk flexed;Decreased dorsiflexion - left;Wide base of support;Abducted - left;Poor foot clearance - left;Left flexed knee in stance;Decreased weight shift to left  L hip ER when using RW   Neuro Re-ed    Neuro Re-ed Details  Seated L gastrocnemius stretch 2 x1-minute holds for tone management prior to initiating standing, gait. Static standing with Total A of standing frame for spasticity management in LLE, to improve extensibility of B hip flexors and promote more upright posture, and for LLE co-contraction. Transitioned from static standing x7 minutes to pt actively performing final 15* of  B hip extension to stand to promote activation of hip extensors and to promote postural awareness. Pt performed x5 reps, performed static standing in frame x3 minutes, then performed final 15* of standing x3 additional reps.                  PT Short Term Goals - 09/15/15 1406    PT SHORT TERM GOAL #1   Title The patient will perform HEP at MOD I level (with assist from family) in order to improve balance, strength, and flexibility. Target date: 10/08/15   Status On-going   PT SHORT TERM GOAL #2   Title The patient will consistently perform sit<>stand with min A and 25% cueing to indicate  increased independence during transfers.  Modified Target date: 10/08/15   Baseline --   Status On-going   PT SHORT TERM GOAL #3   Title Assess bed mobility and write goal. Target date: 10/08/15   Baseline Met 3/8.   Status Achieved   PT SHORT TERM GOAL #4   Title Perform w/c to mat transfer and write goal. Target date: 10/08/15   Baseline Met 3/8.   Status Achieved   PT SHORT TERM GOAL #5   Title The patient will ambulate 96' with RW with max A and +1 assist to improve functional mobility.. Target date 10/08/15   Status On-going   Additional Short Term Goals   Additional Short Term Goals Yes   PT SHORT TERM GOAL #6   Title Pt will consistently perform sit > supine with Min A, min cueing for technique to indicate increased independence getting into bed.  Target date 10/08/15   Baseline 3/8: Mod A for BLE management   Status New   PT SHORT TERM GOAL #7   Title Pt will demo ability to bridge to scoot in all directions (superior, inferior, R/L) with supervision, min cueing to indicate increased independence with bed mobility.    Target date 10/08/15   Status New   PT SHORT TERM GOAL #8   Title Pt will perform stand pivot transfer from w/c <>  mat with Mod  A to decrease physical burden on caregivers.   Target date 10/08/15   Baseline 3/8: stand pivot transfer from w/c > mat table with Max A, no AD.   Status New           PT Long Term Goals - 09/15/15 2114    PT LONG TERM GOAL #1   Title Pt will amb. 65' with RW and mod A to improve functional mobility. Target date: 11/05/15   Status On-going   PT LONG TERM GOAL #2   Title Pt will perform seated scooting in to L and R with supervision, min cueing to increase pt independence with functional mobility. Target date: 11/05/15   Status New   PT LONG TERM GOAL #3   Title The patient will perform sit <> stand with CGA and RW to improve functional mobility. Target date: 11/05/15    Status On-going   PT LONG TERM GOAL #4   Title Pt will perform  stand pivot transfer from w/c <>  mat with Min A to decrease physical burden on caregivers.   Target date: 11/05/15    Status New   PT LONG TERM GOAL #5   Title Pt will consistently perform supine > sit with supervision, min cueing for technique to indicate increased independence getting out of bed.  Target date: 11/05/15    Status New   Additional Long  Term Goals   Additional Long Term Goals Yes   PT LONG TERM GOAL #6   Title Pt will consistently perform sit > supine with supervision, min cueing for technique to indicate increased independence getting into bed.  Target date: 11/05/15    Baseline --   Status New   PT LONG TERM GOAL #8   Baseline --               Plan - 09/24/15 1616    Clinical Impression Statement Session focused on improving postural alignment/control during standing and gait. Pt required less assistance with sit > stand transfers (Min A at times); however, pt tolerance to ambulation limited (to 15' at most) due to pain in lower back and L knee. No c/o pain during static standing with/without standing frame. Pt tolerance to ambulation with B HHA was improved as compared with use of RW with L h/o.   Pt will benefit from skilled therapeutic intervention in order to improve on the following deficits Abnormal gait;Decreased balance;Decreased mobility;Decreased strength;Postural dysfunction;Impaired flexibility;Pain;Decreased activity tolerance;Decreased endurance;Increased muscle spasms;Difficulty walking;Impaired tone   Rehab Potential Good   Clinical Impairments Affecting Rehab Potential patient self pay for services   PT Frequency 2x / week   PT Duration 8 weeks   PT Treatment/Interventions Electrical Stimulation;Gait training;Stair training;Neuromuscular re-education;Balance training;Therapeutic exercise;Therapeutic activities;Functional mobility training;Patient/family education;Wheelchair mobility training;Manual techniques;Orthotic Fit/Training;ADLs/Self Care Home  Management;Biofeedback;DME Instruction   PT Next Visit Plan Stand pivot transfers. Gait training (try B HHA)   Consulted and Agree with Plan of Care Patient;Family member/caregiver   Family Member Consulted grandaughter, Michelle Kemp        Problem List Patient Active Problem List   Diagnosis Date Noted  . Nontraumatic subcortical hemorrhage of right cerebral hemisphere ([place]) 09/15/2015  . Seizures ([place]) 06/21/2015  . Hemorrhage, intracerebral ([place]) 02/03/2015  . Hemiparesis ([place]) 02/03/2015  . Spastic hemiplegia affecting nondominant side ([place]) 02/03/2015   Billie Ruddy, PT, DPT [facility] [address] [place], [place], [address] Phone: [phone]   Fax:  [phone] 09/25/2015, 6:06 PM  Name: Deisy Ozbun MRN: [id number] Date of Birth: [birth year]

## 2015-09-29 ENCOUNTER — Encounter: Payer: Self-pay | Admitting: Physical Therapy

## 2015-09-29 ENCOUNTER — Ambulatory Visit: Payer: Self-pay | Admitting: Occupational Therapy

## 2015-09-29 ENCOUNTER — Ambulatory Visit: Payer: Self-pay | Admitting: Physical Therapy

## 2015-09-29 DIAGNOSIS — R293 Abnormal posture: Secondary | ICD-10-CM

## 2015-09-29 DIAGNOSIS — G8110 Spastic hemiplegia affecting unspecified side: Secondary | ICD-10-CM

## 2015-09-29 DIAGNOSIS — R2681 Unsteadiness on feet: Secondary | ICD-10-CM

## 2015-09-29 DIAGNOSIS — R269 Unspecified abnormalities of gait and mobility: Secondary | ICD-10-CM

## 2015-09-29 DIAGNOSIS — M6281 Muscle weakness (generalized): Secondary | ICD-10-CM

## 2015-09-29 NOTE — Therapy (Signed)
[facility] [address] [place], [place], [address] Phone: [phone]   Fax:  [phone]  Physical Therapy Treatment  Patient Details  Name: Michelle Kemp MRN: [id number] Date of Birth: 05-Dec-1940 Referring Provider: Dr. Erlinda Hong  Encounter Date: 09/29/2015      PT End of Session - 09/29/15 1547    Visit Number 5   Number of Visits 17   Date for PT Re-Evaluation 11/09/15   Authorization Type Self pay   PT Start Time 1446   PT Stop Time 1532   PT Time Calculation (min) 46 min   Equipment Utilized During Treatment Gait belt;Other (comment)  standing frame   Activity Tolerance Patient limited by pain   Behavior During Therapy [facility] for tasks assessed/performed      Past Medical History  Diagnosis Date  . Stroke ([place])   . Arthritis   . Seizures [facility])     Past Surgical History  Procedure Laterality Date  . Hernia repair  2001    There were no vitals filed for this visit.  Visit Diagnosis:  Spastic hemiplegia affecting nondominant side (HCC)  Unsteadiness on feet  Muscle weakness  Abnormality of gait  Postural instability      Subjective Assessment - 09/29/15 1545    Subjective No new complaints. Pt wanting to walk without shoes, in her socks, today with PT. Educated on the fall risk associated with this.   Patient is accompained by: Family member  daughter: [name]   Pertinent History Seizures, pt's dtr reports pt is allergic to Delsym and OTC motrin   Patient Stated Goals To walk again    Currently in Pain? No/denies   Pain Score 0-No pain            [facility] Adult PT Treatment/Exercise - 09/29/15 1549    Transfers   Transfers Sit to Stand;Stand to Sit   Sit to Stand 4: Min assist;With upper extremity assist;From bed;From chair/3-in-1   Sit to Stand Details Verbal cues for technique;Verbal cues for sequencing;Tactile cues for weight shifting;Verbal cues for precautions/safety   Stand to Sit 4:  Min assist;With upper extremity assist;With armrests;To chair/3-in-1;Uncontrolled descent   Stand to Sit Details (indicate cue type and reason) Verbal cues for sequencing;Verbal cues for technique;Verbal cues for precautions/safety;Manual facilitation for weight shifting   Ambulation/Gait   Ambulation/Gait Yes   Ambulation/Gait Assistance 3: Mod assist;4: Min assist  2cd person for safety, occasional assist for left LE advance   Ambulation/Gait Assistance Details blocked practice on gait with short seated rest breaks as needed. Had pt hold onto PTA shoulder with right UE to promote more upright posture/hip extension with gait. belt at waist used to facilitate weight shifting and hip hiking to allow for increased limb advancement with swing phase (left > right needed assistance). added simulated toe cap after 2 short distance gait trails with much improvement noted. Prior to adding toe cap simulation pt needed moderate assistance to advance left leg forward and prevent adduction. once simulated toe cap was added pt was able to self advance left leg forward and this PTA prevented adduction by placing my foot inbetween pt's to promote good base of support. Pt progressed form mod assist with 2cd person for safety to min<>mod assist of 1 person with chair follow only during session.  Ambulation Distance (Feet) 44 Feet  max distance w/o rest break; 130 feet total   Assistive device 1 person hand held assist  simulated toe cap   Gait Pattern Step-through pattern;Decreased stance time - left;Decreased step length - left;Decreased hip/knee flexion - left;Decreased arm swing - right;Decreased arm swing - left;Decreased dorsiflexion - left;Decreased weight shift to left;Narrow base of support   Ambulation Surface Level;Indoor            PT Short Term Goals - 09/15/15 1406    PT SHORT TERM GOAL #1   Title The patient will perform HEP at MOD I level (with assist from family)  in order to improve balance, strength, and flexibility. Target date: 10/08/15   Status On-going   PT SHORT TERM GOAL #2   Title The patient will consistently perform sit<>stand with min A and 25% cueing to indicate increased independence during transfers.  Modified Target date: 10/08/15   Baseline --   Status On-going   PT SHORT TERM GOAL #3   Title Assess bed mobility and write goal. Target date: 10/08/15   Baseline Met 3/8.   Status Achieved   PT SHORT TERM GOAL #4   Title Perform w/c to mat transfer and write goal. Target date: 10/08/15   Baseline Met 3/8.   Status Achieved   PT SHORT TERM GOAL #5   Title The patient will ambulate 15' with RW with max A and +1 assist to improve functional mobility.. Target date 10/08/15   Status On-going   Additional Short Term Goals   Additional Short Term Goals Yes   PT SHORT TERM GOAL #6   Title Pt will consistently perform sit > supine with Min A, min cueing for technique to indicate increased independence getting into bed.  Target date 10/08/15   Baseline 3/8: Mod A for BLE management   Status New   PT SHORT TERM GOAL #7   Title Pt will demo ability to bridge to scoot in all directions (superior, inferior, R/L) with supervision, min cueing to indicate increased independence with bed mobility.    Target date 10/08/15   Status New   PT SHORT TERM GOAL #8   Title Pt will perform stand pivot transfer from w/c <>  mat with Mod  A to decrease physical burden on caregivers.   Target date 10/08/15   Baseline 3/8: stand pivot transfer from w/c > mat table with Max A, no AD.   Status New           PT Long Term Goals - 09/15/15 2114    PT LONG TERM GOAL #1   Title Pt will amb. 31' with RW and mod A to improve functional mobility. Target date: 11/05/15   Status On-going   PT LONG TERM GOAL #2   Title Pt will perform seated scooting in to L and R with supervision, min cueing to increase pt independence with functional mobility. Target date: 11/05/15    Status New   PT LONG TERM GOAL #3   Title The patient will perform sit <> stand with CGA and RW to improve functional mobility. Target date: 11/05/15    Status On-going   PT LONG TERM GOAL #4   Title Pt will perform stand pivot transfer from w/c <>  mat with Min A to decrease physical burden on caregivers.   Target date: 11/05/15    Status New   PT LONG TERM GOAL #5   Title Pt will consistently perform supine > sit with  supervision, min cueing for technique to indicate increased independence getting out of bed.  Target date: 11/05/15    Status New   Additional Long Term Goals   Additional Long Term Goals Yes   PT LONG TERM GOAL #6   Title Pt will consistently perform sit > supine with supervision, min cueing for technique to indicate increased independence getting into bed.  Target date: 11/05/15    Baseline --   Status New   PT LONG TERM GOAL #8   Baseline --           Plan - 09/29/15 1547    Clinical Impression Statement Today's skilled session focused on gait with upright posture and on decreasing gait deviations. Pt with improved ability to self advance left LE when simulated toe cap added for last half of session. Pt is making steady progress toward her gait goals.   Pt will benefit from skilled therapeutic intervention in order to improve on the following deficits Abnormal gait;Decreased balance;Decreased mobility;Decreased strength;Postural dysfunction;Impaired flexibility;Pain;Decreased activity tolerance;Decreased endurance;Increased muscle spasms;Difficulty walking;Impaired tone   Rehab Potential Good   Clinical Impairments Affecting Rehab Potential patient self pay for services   PT Frequency 2x / week   PT Duration 8 weeks   PT Treatment/Interventions Electrical Stimulation;Gait training;Stair training;Neuromuscular re-education;Balance training;Therapeutic exercise;Therapeutic activities;Functional mobility training;Patient/family education;Wheelchair mobility training;Manual  techniques;Orthotic Fit/Training;ADLs/Self Care Home Management;Biofeedback;DME Instruction   PT Next Visit Plan Stand pivot transfers. Gait training with LRAD   Consulted and Agree with Plan of Care Patient;Family member/caregiver   Family Member Consulted grandaughter, Abby        Problem List Patient Active Problem List   Diagnosis Date Noted  . 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 Seizures ([place]) 06/21/2015  . Hemorrhage, intracerebral ([place]) 02/03/2015  . Hemiparesis ([place]) 02/03/2015  . Spastic hemiplegia affecting nondominant side ([place]) 02/03/2015    Willow Ora, PTA, [facility] [address] [place], [place] [address] [phone] 09/29/2015, 4:04 PM   Name: Michelle Kemp MRN: [id number] Date of Birth: 14-Nov-1940

## 2015-09-30 ENCOUNTER — Encounter: Payer: Self-pay | Admitting: Occupational Therapy

## 2015-09-30 NOTE — Therapy (Signed)
[facility] [address] [place], [place], [address] Phone: [phone]   Fax:  [phone]  Occupational Therapy Treatment  Patient Details  Name: Michelle Kemp MRN: [id number] Date of Birth: [birth year] Referring Provider: Dr Marvel Plan  Encounter Date: 09/29/2015      OT End of Session - 09/30/15 0525    Visit Number 6   Number of Visits 17   Date for OT Re-Evaluation 11/05/15   Authorization Type self pay   OT Start Time 1403   OT Stop Time 1447   OT Time Calculation (min) 44 min   Activity Tolerance Patient tolerated treatment well   Behavior During Therapy [facility] for tasks assessed/performed      Past Medical History  Diagnosis Date  . Stroke (HCC)   . Arthritis   . Seizures [facility])     Past Surgical History  Procedure Laterality Date  . Hernia repair  2001    There were no vitals filed for this visit.  Visit Diagnosis:  Spastic hemiplegia affecting nondominant side (HCC)  Postural instability  Unsteadiness on feet      Subjective Assessment - 09/30/15 0519    Subjective  Patient feels she walks much better without shoes   Patient is accompained by: Family member   Currently in Pain? No/denies   Pain Score 0-No pain                      OT Treatments/Exercises (OP) - 09/30/15 0001    ADLs   Functional Mobility Practiced stand step turns to sit and stand from chair.  Patient able to walk into kitchen using walker with forearm attachment on left and min assist.  Patient needs assist for weight shift and postural control.     Neurological Re-education Exercises   Other Exercises 1 Neuromuscular reeducation to address postural control and lower extremity (left) activation during sit to stand, and stand to sit transition.  Also worked on Artist without upper extremity support.  Patient needs UE support and initial contact to balance self in standing, although once  standing able to remain for 35 seconds.  Patient even able to turn head right, left, nod head up and down in free standing without loss of balance.  Patient unable to [facility] either foot in standing without UE support.                  OT Education - 09/30/15 0525    Education provided Yes   Education Details postural control for standing, stepping   Person(s) Educated Patient;Child(ren)   Methods Explanation;Demonstration;Verbal cues   Comprehension Verbalized understanding;Returned demonstration;Verbal cues required;Need further instruction          OT Short Term Goals - 09/10/15 1603    OT SHORT TERM GOAL #1   Title Patient will report no greater than 3/10 pain with elbow flexion and extension in HEP , due 10/05/14   Status On-going   OT SHORT TERM GOAL #2   Title Patient will demonstrate 5 degree increase in shoulder flexion to aide personal care with upper body bathing, and dressing   Status On-going   OT SHORT TERM GOAL #3   Title Patient will assist caregiver in left arm management when rolling from supine to sidelying on right side with visual / verbal cueing.   Status On-going   OT SHORT TERM GOAL #4   Title Patient will transition from sit to stand from chair without pulling  up on surface with right arm to aide in repositioning self for comfort when alone in apartment during the afternoon   Status On-going   OT SHORT TERM GOAL #5   Title Patient will transfer to toilet with mod assist, and stand safely to allow clothing management   Status On-going           OT Long Term Goals - 09/10/15 1604    OT LONG TERM GOAL #1   Title Patient and caregiver will independently complete HEP  for LUE with pain report no greater than 1-2/10   Status On-going   OT LONG TERM GOAL #2   Title Patient will transfer to toilet with min assist   Status On-going   OT LONG TERM GOAL #3   Title Patient will complete toilet hygiene with min assist   Status On-going   OT LONG TERM  GOAL #4   Title Patient will roll from supine to/from sidelying with minimal assistance to reposition self in bed for comfort   Status On-going   OT LONG TERM GOAL #5   Title Patient will demonstrate 10 degree increase in shoulder flexion to aide in personal grooming, bathing and upper body dressing   Status On-going   OT LONG TERM GOAL #6   Title Patient will grasp and release 1-3 inch object, with left hand, with minimal assistance,  as part of bimanual ADL task   Status On-going               Plan - 09/30/15 0526    Clinical Impression Statement Patient's progress with functional mobility and use of left arm following botox is slow, although overall arm pain is better.     Pt will benefit from skilled therapeutic intervention in order to improve on the following deficits (Retired) Decreased activity tolerance;Decreased balance;Decreased coordination;Decreased endurance;Impaired flexibility;Impaired perceived functional ability;Decreased safety awareness;Decreased range of motion;Decreased mobility;Decreased knowledge of use of DME;Decreased knowledge of precautions;Decreased strength;Increased edema;Increased muscle spasms;Impaired sensation;Pain;Impaired tone;Impaired UE functional use;Impaired vision/preception   Rehab Potential Good   Clinical Impairments Affecting Rehab Potential limited financial resources   OT Frequency 2x / week  discussed with daughter the possibility of 1x/week to reduce stress for family and work schedules   OT Duration 8 weeks   OT Treatment/Interventions Self-care/ADL training;Electrical Stimulation;Cryotherapy;Moist Heat;Visual/perceptual remediation/compensation;Patient/family education;Balance training;Splinting;Therapeutic exercises;Therapeutic activities;Neuromuscular education;Functional Mobility Training;Fluidtherapy;Biofeedback;DME and/or AE instruction;Cognitive remediation/compensation;Ultrasound   Plan NMR LUE, Dynamic stand balance   Consulted  and Agree with Plan of Care Patient;Family member/caregiver   Family Member Consulted daughter [name]- Julie        Problem List Patient Active Problem List   Diagnosis Date Noted  . 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 Seizures (HCC) 06/21/2015  . Hemorrhage, intracerebral (HCC) 02/03/2015  . Hemiparesis (HCC) 02/03/2015  . Spastic hemiplegia affecting nondominant side (HCC) 02/03/2015    Collier SalinaGellert, Kristin M, OTR/L 09/30/2015, 5:30 AM  [facility] [address] [place], [place], [address] Phone: [phone]   Fax:  [phone]  Name: Michelle Kemp MRN: [id number] Date of Birth: 04/24/1941

## 2015-10-01 ENCOUNTER — Ambulatory Visit: Payer: Self-pay | Admitting: Occupational Therapy

## 2015-10-01 ENCOUNTER — Encounter: Payer: Self-pay | Admitting: Physical Therapy

## 2015-10-01 ENCOUNTER — Encounter: Payer: Self-pay | Admitting: Occupational Therapy

## 2015-10-01 ENCOUNTER — Ambulatory Visit: Payer: Self-pay | Admitting: Physical Therapy

## 2015-10-01 DIAGNOSIS — R293 Abnormal posture: Secondary | ICD-10-CM

## 2015-10-01 DIAGNOSIS — G8110 Spastic hemiplegia affecting unspecified side: Secondary | ICD-10-CM

## 2015-10-01 DIAGNOSIS — R269 Unspecified abnormalities of gait and mobility: Secondary | ICD-10-CM

## 2015-10-01 DIAGNOSIS — R2681 Unsteadiness on feet: Secondary | ICD-10-CM

## 2015-10-01 DIAGNOSIS — M6281 Muscle weakness (generalized): Secondary | ICD-10-CM

## 2015-10-01 NOTE — Therapy (Signed)
[facility] [address] [place], [place], [address] Phone: [phone]   Fax:  [phone]  Physical Therapy Treatment  Patient Details  Name: Michelle Kemp MRN: [id number] Date of Birth: 1940/11/09 Referring Provider: Dr. Erlinda Hong  Encounter Date: 10/01/2015      PT End of Session - 10/01/15 1406    Visit Number 6   Number of Visits 17   Date for PT Re-Evaluation 11/09/15   Authorization Type Self pay   PT Start Time 1402   PT Stop Time 1445   PT Time Calculation (min) 43 min   Equipment Utilized During Treatment Gait belt;Other (comment)  standing frame   Activity Tolerance Patient limited by pain   Behavior During Therapy [facility] for tasks assessed/performed      Past Medical History  Diagnosis Date  . Stroke ([place])   . Arthritis   . Seizures [facility])     Past Surgical History  Procedure Laterality Date  . Hernia repair  2001    There were no vitals filed for this visit.  Visit Diagnosis:  Postural instability  Unsteadiness on feet  Muscle weakness  Abnormality of gait  Spastic hemiplegia affecting nondominant side (HCC)      Subjective Assessment - 10/01/15 1405    Subjective No new complaints. No pain currently per grand-daughter's report in lobby. Daughter [name] on her way to provided translation as grand-daughter has sick infant and needs to leave when her mom gets here.   Pertinent History Seizures, pt's dtr reports pt is allergic to Delsym and OTC motrin   Patient Stated Goals To walk again    Currently in Pain? No/denies   Pain Score 0-No pain            [facility] Adult PT Treatment/Exercise - 10/01/15 1423    Transfers   Transfers Sit to Stand;Stand to Sit   Sit to Stand 4: Min assist;With upper extremity assist;From bed;From chair/3-in-1;With armrests   Sit to Stand Details Verbal cues for technique;Verbal cues for sequencing;Tactile cues for weight shifting;Verbal cues for  precautions/safety   Sit to Stand Details (indicate cue type and reason) cues on seqencing and technique. increased assistance form mat without arm rests vs from chair with armrests   Sit to Stand: Patient Percentage 70%  70-80%   Stand to Sit 4: Min assist;With upper extremity assist;With armrests;Uncontrolled descent;To chair/3-in-1;To bed   Stand to Sit: Patient Percentage 80%  70-80%   Stand to Sit Details (indicate cue type and reason) Verbal cues for sequencing;Verbal cues for technique;Verbal cues for precautions/safety;Manual facilitation for weight shifting   Stand to Sit Details cues to reach back and use arm/legs to control descent.   Ambulation/Gait   Ambulation/Gait Yes   Ambulation/Gait Assistance 3: Mod assist   Ambulation/Gait Assistance Details cues on posture, increased step length bil sides. facilitation for posture, to decrease upper trunk rotation, to decrease left leg adduction with swing phase and to assit with left leg advancement.                                   Ambulation Distance (Feet) 20 Feet  x2, 35 x1, 42 x1   Assistive device 1 person hand held assist   Gait Pattern Step-through pattern;Decreased stance time - left;Decreased step length - left;Decreased hip/knee flexion - left;Decreased arm swing - right;Decreased arm swing - left;Decreased dorsiflexion - left;Decreased weight shift to left;Narrow base of  support   Ambulation Surface Level;Indoor   Neuro Re-ed    Neuro Re-ed Details  standing next to mat table without UE support: right UE reaching in all directions up/left/right to promote more upright posture and equal weight bearing. Had pt resist movements with pressure throught her right UE working towards decreased trunk rotation and upright posture as well.                          PT Short Term Goals - 10/01/15 1419    PT SHORT TERM GOAL #1   Title The patient will perform HEP at MOD I level (with assist from family) in order to improve balance,  strength, and flexibility. Target date: 10/08/15   Status On-going   PT SHORT TERM GOAL #2   Title The patient will consistently perform sit<>stand with min A and 25% cueing to indicate increased independence during transfers.  Modified Target date: 10/08/15   Status On-going   PT SHORT TERM GOAL #3   Title Assess bed mobility and write goal. Target date: 10/08/15   Baseline Met 3/8.   Status Achieved   PT SHORT TERM GOAL #4   Title Perform w/c to mat transfer and write goal. Target date: 10/08/15   Baseline Met 3/8.   Status Achieved   PT SHORT TERM GOAL #5   Title The patient will ambulate 68' with RW with max A and +1 assist to improve functional mobility.. Target date 10/08/15   Status On-going   PT SHORT TERM GOAL #6   Title Pt will consistently perform sit > supine with Min A, min cueing for technique to indicate increased independence getting into bed.  Target date 10/08/15   Baseline 3/8: Mod A for BLE management   Status New   PT SHORT TERM GOAL #7   Title Pt will demo ability to bridge to scoot in all directions (superior, inferior, R/L) with supervision, min cueing to indicate increased independence with bed mobility.    Target date 10/08/15   Status New   PT SHORT TERM GOAL #8   Title Pt will perform stand pivot transfer from w/c <>  mat with Mod  A to decrease physical burden on caregivers.   Target date 10/08/15   Baseline 3/8: stand pivot transfer from w/c > mat table with Max A, no AD.   Status New           PT Long Term Goals - 09/15/15 2114    PT LONG TERM GOAL #1   Title Pt will amb. 33' with RW and mod A to improve functional mobility. Target date: 11/05/15   Status On-going   PT LONG TERM GOAL #2   Title Pt will perform seated scooting in to L and R with supervision, min cueing to increase pt independence with functional mobility. Target date: 11/05/15   Status New   PT LONG TERM GOAL #3   Title The patient will perform sit <> stand with CGA and RW to improve  functional mobility. Target date: 11/05/15    Status On-going   PT LONG TERM GOAL #4   Title Pt will perform stand pivot transfer from w/c <>  mat with Min A to decrease physical burden on caregivers.   Target date: 11/05/15    Status New   PT LONG TERM GOAL #5   Title Pt will consistently perform supine > sit with supervision, min cueing for technique to indicate increased independence getting  out of bed.  Target date: 11/05/15    Status New   Additional Long Term Goals   Additional Long Term Goals Yes   PT LONG TERM GOAL #6   Title Pt will consistently perform sit > supine with supervision, min cueing for technique to indicate increased independence getting into bed.  Target date: 11/05/15    Baseline --   Status New   PT LONG TERM GOAL #8   Baseline --           Plan - 10/01/15 1406    Clinical Impression Statement Today's skilled session focused on gait and standing balance/posture. Pt needed increased assistance with gait today vs with previous session and was noted to have increased left leg tone. This created a need for increased assist to balance and control/advance the left leg with gait. Pt's daughter reported that the pt is scheduled for another botox injection to her leg next Tuesday (10/05/15). Will discuss with primary PT and may need to hold PT for a short duration post injection per our policy, will check in with primary PT.                              Pt will benefit from skilled therapeutic intervention in order to improve on the following deficits Abnormal gait;Decreased balance;Decreased mobility;Decreased strength;Postural dysfunction;Impaired flexibility;Pain;Decreased activity tolerance;Decreased endurance;Increased muscle spasms;Difficulty walking;Impaired tone   Rehab Potential Good   Clinical Impairments Affecting Rehab Potential patient self pay for services   PT Frequency 2x / week   PT Duration 8 weeks   PT Treatment/Interventions Electrical Stimulation;Gait  training;Stair training;Neuromuscular re-education;Balance training;Therapeutic exercise;Therapeutic activities;Functional mobility training;Patient/family education;Wheelchair mobility training;Manual techniques;Orthotic Fit/Training;ADLs/Self Care Home Management;Biofeedback;DME Instruction   PT Next Visit Plan assess STGs. ? hold for a duratation after next botox injection.   Consulted and Agree with Plan of Care Patient;Family member/caregiver   Family Member Consulted grandaughter, Abby        Problem List Patient Active Problem List   Diagnosis Date Noted  . Nontraumatic subcortical hemorrhage of right cerebral hemisphere ([place]) 09/15/2015  . Seizures ([place]) 06/21/2015  . Hemorrhage, intracerebral ([place]) 02/03/2015  . Hemiparesis ([place]) 02/03/2015  . Spastic hemiplegia affecting nondominant side ([place]) 02/03/2015    Willow Ora, PTA, [facility] [address], [place], [place] [address] [phone] 10/01/2015, 3:26 PM   Name: Michelle Kemp MRN: [id number] Date of Birth: 08/19/1940

## 2015-10-01 NOTE — Therapy (Signed)
[facility] [address] [place], [place], [address] Phone: [phone]   Fax:  [phone]  Occupational Therapy Treatment  Patient Details  Name: Michelle Kemp MRN: [id number] Date of Birth: 12/06/1940 Referring Provider: Dr Marvel PlanJindong Xu  Encounter Date: 10/01/2015      OT End of Session - 10/01/15 1605    Visit Number 7   Number of Visits 17   Date for OT Re-Evaluation 11/05/15   Authorization Type self pay   OT Start Time 1447   OT Stop Time 1530   OT Time Calculation (min) 43 min      Past Medical History  Diagnosis Date  . Stroke (HCC)   . Arthritis   . Seizures [facility](HCC)     Past Surgical History  Procedure Laterality Date  . Hernia repair  2001    There were no vitals filed for this visit.  Visit Diagnosis:  Abnormal posture  Unsteadiness on feet      Subjective Assessment - 10/01/15 1559    Subjective  Patient eager to address balance issues today in standing   Patient is accompained by: Family member   Currently in Pain? No/denies   Pain Score 0-No pain                      OT Treatments/Exercises (OP) - 10/01/15 1601    ADLs   Functional Mobility Practiced sit to stand and stand to sit transitions with decreased support, and decreased reliance on pulling form UE's.  Patient needing facilitation to reamin in midline with trunk to facilitate bilateral lower extremity activation for standing.  Patient able to stand without support for 5 minutes today - static and dynamic (minor weight shifts.)  Patient able to complete stance on left leg to step up onto 1 inch, than 3 inch riser repetitiively with right foot.  Emphasis on postural control, and activation / alignment in left leg.                  OT Education - 10/01/15 1604    Education provided Yes   Education Details Recommended short pause in PT services after discussion with PT and family, as patient receiving Botox  injection in leg next week.  Will continue OT 1x/week until return to PT   Person(s) Educated Patient;Child(ren)   Methods Explanation   Comprehension Verbalized understanding          OT Short Term Goals - 10/01/15 1607    OT SHORT TERM GOAL #1   Title Patient will report no greater than 3/10 pain with elbow flexion and extension in HEP , due 10/05/14   Status Achieved   OT SHORT TERM GOAL #2   Title Patient will demonstrate 5 degree increase in shoulder flexion to aide personal care with upper body bathing, and dressing   Status On-going   OT SHORT TERM GOAL #3   Title Patient will assist caregiver in left arm management when rolling from supine to sidelying on right side with visual / verbal cueing.   Status On-going   OT SHORT TERM GOAL #4   Title Patient will transition from sit to stand from chair without pulling up on surface with right arm to aide in repositioning self for comfort when alone in apartment during the afternoon   Baseline Patient unable to reach forward and therefore is dressed in bed - dependent   Status On-going   OT SHORT TERM GOAL #5  Title Patient will transfer to toilet with mod assist, and stand safely to allow clothing management   Status On-going           OT Long Term Goals - 09/10/15 1604    OT LONG TERM GOAL #1   Title Patient and caregiver will independently complete HEP  for LUE with pain report no greater than 1-2/10   Status On-going   OT LONG TERM GOAL #2   Title Patient will transfer to toilet with min assist   Status On-going   OT LONG TERM GOAL #3   Title Patient will complete toilet hygiene with min assist   Status On-going   OT LONG TERM GOAL #4   Title Patient will roll from supine to/from sidelying with minimal assistance to reposition self in bed for comfort   Status On-going   OT LONG TERM GOAL #5   Title Patient will demonstrate 10 degree increase in shoulder flexion to aide in personal grooming, bathing and upper body  dressing   Status On-going   OT LONG TERM GOAL #6   Title Patient will grasp and release 1-3 inch object, with left hand, with minimal assistance,  as part of bimanual ADL task   Status On-going               Plan - 10/01/15 1606    Clinical Impression Statement Patient progressing with functional mobility this week and with consistent report of decreased to no pain in left UE.     Pt will benefit from skilled therapeutic intervention in order to improve on the following deficits (Retired) Decreased activity tolerance;Decreased balance;Decreased coordination;Decreased endurance;Impaired flexibility;Impaired perceived functional ability;Decreased safety awareness;Decreased range of motion;Decreased mobility;Decreased knowledge of use of DME;Decreased knowledge of precautions;Decreased strength;Increased edema;Increased muscle spasms;Impaired sensation;Pain;Impaired tone;Impaired UE functional use;Impaired vision/preception   Rehab Potential Good   Clinical Impairments Affecting Rehab Potential limited financial resources   OT Frequency 2x / week   OT Duration 8 weeks   OT Treatment/Interventions Self-care/ADL training;Electrical Stimulation;Cryotherapy;Moist Heat;Visual/perceptual remediation/compensation;Patient/family education;Balance training;Splinting;Therapeutic exercises;Therapeutic activities;Neuromuscular education;Functional Mobility Training;Fluidtherapy;Biofeedback;DME and/or AE instruction;Cognitive remediation/compensation;Ultrasound   Plan NMR LUE, stand balance   Consulted and Agree with Plan of Care Patient;Family member/caregiver   Family Member Consulted daughter [name]        Problem List Patient Active Problem List   Diagnosis Date Noted  . 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 Seizures (HCC) 06/21/2015  . Hemorrhage, intracerebral (HCC) 02/03/2015  . Hemiparesis (HCC) 02/03/2015  . Spastic hemiplegia affecting  nondominant side (HCC) 02/03/2015    Collier Salina, OTR/L 10/01/2015, 4:11 PM  [facility] [address] [place], [place], [address] Phone: [phone]   Fax:  [phone]  Name: Michelle Kemp MRN: [id number] Date of Birth: October 05, 1940

## 2015-10-04 ENCOUNTER — Ambulatory Visit: Payer: Self-pay | Admitting: Occupational Therapy

## 2015-10-04 ENCOUNTER — Ambulatory Visit: Payer: Self-pay | Admitting: Physical Therapy

## 2015-10-05 ENCOUNTER — Ambulatory Visit: Payer: Self-pay | Admitting: Physical Medicine & Rehabilitation

## 2015-10-08 ENCOUNTER — Ambulatory Visit: Payer: Self-pay | Admitting: Physical Therapy

## 2015-10-08 ENCOUNTER — Ambulatory Visit: Payer: Self-pay | Admitting: Occupational Therapy

## 2015-10-11 ENCOUNTER — Ambulatory Visit: Payer: Self-pay | Admitting: Occupational Therapy

## 2015-10-11 ENCOUNTER — Ambulatory Visit: Payer: Self-pay | Admitting: Physical Therapy

## 2015-10-13 ENCOUNTER — Ambulatory Visit: Payer: Self-pay | Admitting: Physical Therapy

## 2015-10-13 ENCOUNTER — Ambulatory Visit: Payer: Self-pay | Admitting: Occupational Therapy

## 2015-10-18 ENCOUNTER — Ambulatory Visit: Payer: Self-pay | Admitting: Physical Therapy

## 2015-10-18 ENCOUNTER — Ambulatory Visit: Payer: Self-pay | Admitting: Occupational Therapy

## 2015-10-20 ENCOUNTER — Ambulatory Visit: Payer: Self-pay | Admitting: Occupational Therapy

## 2015-10-25 ENCOUNTER — Ambulatory Visit: Payer: Self-pay | Attending: Neurology | Admitting: Physical Therapy

## 2015-10-25 ENCOUNTER — Ambulatory Visit: Payer: Self-pay | Admitting: Occupational Therapy

## 2015-10-25 DIAGNOSIS — R2689 Other abnormalities of gait and mobility: Secondary | ICD-10-CM | POA: Insufficient documentation

## 2015-10-25 NOTE — Therapy (Signed)
[place] [address] [place], [place], [address] Phone: [phone]   Fax:  [phone]  Physical Therapy Treatment  Patient Details  Name: Michelle Kemp MRN: [id number] Date of Birth: [birth year] Referring Provider: Dr. Erlinda Hong  Encounter Date: 10/25/2015      PT End of Session - 10/25/15 2017    Visit Number 7   Number of Visits 17   Date for PT Re-Evaluation 11/09/15   Authorization Type Self pay   PT Start Time 1454   PT Stop Time 1541   PT Time Calculation (min) 47 min   Equipment Utilized During Treatment Gait belt   Activity Tolerance Patient tolerated treatment well   Behavior During Therapy [facility] for tasks assessed/performed      Past Medical History  Diagnosis Date  . Stroke ([place])   . Arthritis   . Seizures [facility])     Past Surgical History  Procedure Laterality Date  . Hernia repair  2001    There were no vitals filed for this visit.      Subjective Assessment - 10/25/15 2006    Subjective Daughter present with pt today. Daughter reports that pt "walked in" to clinc using her walker. No recent Botox injections received, per daughter.  Daughter inquiring about different brace as well as different hand orthosis for walker.   Patient is accompained by: Family member  daughter, Michelle Kemp   Pertinent History Seizures, pt's dtr reports pt is allergic to Delsym and OTC motrin   Patient Stated Goals To walk again    Currently in Pain? No/denies  No c/o pain throughout session                         [place] Adult PT Treatment/Exercise - 10/25/15 0001    Transfers   Transfers Sit to Stand;Stand to Sit   Sit to Stand 4: Min assist   Sit to Stand Details (indicate cue type and reason) sit > stand w/c with personal standard walker. Daughter provided cueing (from behind pt) for full anterior weight shift   Stand to Sit 4: Min assist   Stand to Sit Details Min A to control descent    Ambulation/Gait   Ambulation/Gait Yes   Ambulation/Gait Assistance 3: Mod assist   Ambulation/Gait Assistance Details Gait x20' with L AFO (Reaction); gait trial ended due to pt discomfort wearing AFO. Subsequent trial x65' without AFO and without toe cap. Rehab tech provided min guard - min A for stability/balance; PT manual redirecting L foot placement / repositioning L foot due to excessive L hip ADD/ER. Cueing addressed pt tendency to rotate trunk to L side; also focused on safe/appropriate pt distance from walker (to avoid forward-flexed posture) and for increased LLE step length.   Ambulation Distance (Feet) 65 Feet  and 20'   Assistive device Standard walker  pt's standard walker with 4 tennis balls for dec friction   Gait Pattern Decreased stance time - left;Decreased step length - left;Decreased hip/knee flexion - left;Decreased dorsiflexion - left;Decreased weight shift to left;Narrow base of support;Trunk rotated posteriorly on left;Step-to pattern;Step-through pattern;Poor foot clearance - left  L hip ADD/IR   Ambulation Surface Level;Indoor   Gait Comments Noted gait pattern more fluid and stable when cadence increased                PT Education - 10/25/15 2023    Education provided Yes   Education Details Per daughter's inquiries, discussed other bracing  options (specifically, carbon fiber; however, pt did not find comfortable during this session) and L hand orthosis for RW. Daughter to bring h/o to next session.    Person(s) Educated Patient;Child(ren)   Methods Explanation;Demonstration   Comprehension Verbalized understanding          PT Short Term Goals - 10/25/15 2021    PT SHORT TERM GOAL #1   Title The patient will perform HEP at MOD I level (with assist from family) in order to improve balance, strength, and flexibility. Target date: 10/08/15   Status On-going   PT SHORT TERM GOAL #2   Title The patient will consistently perform sit<>stand with min A and  25% cueing to indicate increased independence during transfers.  Modified Target date: 10/08/15   Status On-going   PT SHORT TERM GOAL #3   Title Assess bed mobility and write goal. Target date: 10/08/15   Baseline Met 3/8.   Status Achieved   PT SHORT TERM GOAL #4   Title Perform w/c to mat transfer and write goal. Target date: 10/08/15   Baseline Met 3/8.   Status Achieved   PT SHORT TERM GOAL #5   Title The patient will ambulate 69' with RW with max A and +1 assist to improve functional mobility. Target date 10/08/15   Status On-going   PT SHORT TERM GOAL #6   Title Pt will consistently perform sit > supine with Min A, min cueing for technique to indicate increased independence getting into bed.  Target date 10/08/15   Baseline 3/8: Mod A for BLE management   Status On-going   PT SHORT TERM GOAL #7   Title Pt will demo ability to bridge to scoot in all directions (superior, inferior, R/L) with supervision, min cueing to indicate increased independence with bed mobility.    Target date 10/08/15   Status On-going   PT SHORT TERM GOAL #8   Title Pt will perform stand pivot transfer from w/c <>  mat with Mod  A to decrease physical burden on caregivers.   Target date 10/08/15   Baseline 3/8: stand pivot transfer from w/c > mat table with Max A, no AD.   Status On-going           PT Long Term Goals - 09/15/15 2114    PT LONG TERM GOAL #1   Title Pt will amb. 48' with RW and mod A to improve functional mobility. Target date: 11/05/15   Status On-going   PT LONG TERM GOAL #2   Title Pt will perform seated scooting in to L and R with supervision, min cueing to increase pt independence with functional mobility. Target date: 11/05/15   Status New   PT LONG TERM GOAL #3   Title The patient will perform sit <> stand with CGA and RW to improve functional mobility. Target date: 11/05/15    Status On-going   PT LONG TERM GOAL #4   Title Pt will perform stand pivot transfer from w/c <>  mat with  Min A to decrease physical burden on caregivers.   Target date: 11/05/15    Status New   PT LONG TERM GOAL #5   Title Pt will consistently perform supine > sit with supervision, min cueing for technique to indicate increased independence getting out of bed.  Target date: 11/05/15    Status New   Additional Long Term Goals   Additional Long Term Goals Yes   PT LONG TERM GOAL #6   Title Pt  will consistently perform sit > supine with supervision, min cueing for technique to indicate increased independence getting into bed.  Target date: 11/05/15    Baseline --   Status New   PT LONG TERM GOAL #8   Baseline --               Plan - 10/25/15 2018    Clinical Impression Statement PT held for past 3 weeks due to plan for Botox injection in LLE on 10/05/15. However, daughter reports pt did not receive botox. Therefore, current session focused on gait training with pt's personal (standard) walker; added tennis balls (all 4 legs) to increase ease of advancement. Pt ambulated 73' with mod A (min A x2); assist required for manual advancement of LLE due to excessive L hip adduction/internal rotation during gait. Pt did not mention pain throughout this session. Continue per POC.   Rehab Potential Good   Clinical Impairments Affecting Rehab Potential patient self pay for services   PT Frequency 2x / week   PT Duration 8 weeks   PT Treatment/Interventions Electrical Stimulation;Gait training;Stair training;Neuromuscular re-education;Balance training;Therapeutic exercise;Therapeutic activities;Functional mobility training;Patient/family education;Wheelchair mobility training;Manual techniques;Orthotic Fit/Training;ADLs/Self Care Home Management;Biofeedback;DME Instruction   PT Next Visit Plan Assess STG's. If daughter brings in hand orthosis, try to help family utilize this (if possible).    Consulted and Agree with Plan of Care Patient;Family member/caregiver   Family Member Consulted daughter, Michelle Kemp       Patient will benefit from skilled therapeutic intervention in order to improve the following deficits and impairments:  Abnormal gait, Decreased balance, Decreased mobility, Decreased strength, Postural dysfunction, Impaired flexibility, Pain, Decreased activity tolerance, Decreased endurance, Increased muscle spasms, Difficulty walking, Impaired tone  Visit Diagnosis: Other abnormalities of gait and mobility     Problem List Patient Active Problem List   Diagnosis Date Noted  . Nontraumatic subcortical hemorrhage of right cerebral hemisphere ([place]) 09/15/2015  . Seizures ([place]) 06/21/2015  . Hemorrhage, intracerebral ([place]) 02/03/2015  . Hemiparesis ([place]) 02/03/2015  . Spastic hemiplegia affecting nondominant side ([place]) 02/03/2015    Billie Ruddy, PT, DPT [facility] [address] [place], [place], [address] Phone: [phone]   Fax:  [phone] 10/25/2015, 8:27 PM  Name: Michelle Kemp MRN: [id number] Date of Birth: [birth year]

## 2015-10-27 ENCOUNTER — Ambulatory Visit: Payer: Self-pay | Admitting: Physical Therapy

## 2015-10-27 ENCOUNTER — Ambulatory Visit: Payer: Self-pay | Admitting: Occupational Therapy

## 2015-10-29 ENCOUNTER — Ambulatory Visit: Payer: Self-pay

## 2015-10-29 ENCOUNTER — Ambulatory Visit: Payer: Self-pay | Admitting: Physical Medicine & Rehabilitation

## 2015-11-01 ENCOUNTER — Ambulatory Visit: Payer: Self-pay | Admitting: Physical Therapy

## 2015-11-02 ENCOUNTER — Ambulatory Visit (HOSPITAL_BASED_OUTPATIENT_CLINIC_OR_DEPARTMENT_OTHER): Payer: Self-pay | Admitting: Physical Medicine & Rehabilitation

## 2015-11-02 ENCOUNTER — Encounter: Payer: Self-pay | Attending: Physical Medicine & Rehabilitation

## 2015-11-02 ENCOUNTER — Encounter: Payer: Self-pay | Admitting: Physical Medicine & Rehabilitation

## 2015-11-02 VITALS — BP 154/97 | HR 77

## 2015-11-02 DIAGNOSIS — G8110 Spastic hemiplegia affecting unspecified side: Secondary | ICD-10-CM

## 2015-11-02 DIAGNOSIS — Z7982 Long term (current) use of aspirin: Secondary | ICD-10-CM | POA: Insufficient documentation

## 2015-11-02 NOTE — Progress Notes (Signed)
Botox Injection for spasticity using needle EMG guidance  Dilution: 50 Units/ml Indication: Severe spasticity which interferes with ADL,mobility and/or  hygiene and is unresponsive to medication management and other conservative care Informed consent was obtained after describing risks and benefits of the procedure with the patient. This includes bleeding, bruising, infection, excessive weakness, or medication side effects. A REMS form is on file and signed. Needle: 25-gauge 2 inch needle electrode Number of units per muscle  Left upper ext  FCR50  FDS50 FDP50 FPL25 Pronator25  Left Lower ext VMO 50 U Rectus fem 75U Vast Lat 75U All injections were done after obtaining appropriate EMG activity and after negative drawback for blood. The patient tolerated the procedure well. Post procedure instructions were given. A followup appointment was made.

## 2015-11-02 NOTE — Patient Instructions (Signed)

## 2015-11-03 ENCOUNTER — Encounter: Payer: Self-pay | Admitting: Occupational Therapy

## 2015-11-03 ENCOUNTER — Ambulatory Visit: Payer: Self-pay | Admitting: Physical Therapy

## 2015-11-04 ENCOUNTER — Ambulatory Visit: Payer: Self-pay | Admitting: Occupational Therapy

## 2015-11-08 ENCOUNTER — Encounter: Payer: Self-pay | Admitting: Occupational Therapy

## 2015-11-08 ENCOUNTER — Ambulatory Visit: Payer: Self-pay | Admitting: Physical Therapy

## 2015-11-09 ENCOUNTER — Encounter: Payer: Self-pay | Admitting: Occupational Therapy

## 2015-11-09 ENCOUNTER — Ambulatory Visit: Payer: Self-pay | Attending: Neurology | Admitting: Occupational Therapy

## 2015-11-09 DIAGNOSIS — R2681 Unsteadiness on feet: Secondary | ICD-10-CM | POA: Insufficient documentation

## 2015-11-09 DIAGNOSIS — M25612 Stiffness of left shoulder, not elsewhere classified: Secondary | ICD-10-CM | POA: Insufficient documentation

## 2015-11-09 DIAGNOSIS — M25512 Pain in left shoulder: Secondary | ICD-10-CM | POA: Insufficient documentation

## 2015-11-09 DIAGNOSIS — R293 Abnormal posture: Secondary | ICD-10-CM | POA: Insufficient documentation

## 2015-11-09 DIAGNOSIS — R2689 Other abnormalities of gait and mobility: Secondary | ICD-10-CM | POA: Insufficient documentation

## 2015-11-09 DIAGNOSIS — M25522 Pain in left elbow: Secondary | ICD-10-CM | POA: Insufficient documentation

## 2015-11-09 DIAGNOSIS — G8114 Spastic hemiplegia affecting left nondominant side: Secondary | ICD-10-CM | POA: Insufficient documentation

## 2015-11-09 NOTE — Therapy (Signed)
[place] [address] [place], [place], [address] Phone: [phone]   Fax:  [phone]  Occupational Therapy Treatment  Patient Details  Name: Michelle Kemp MRN: [id number] Date of Birth: [birth year] Referring Provider: Dr Rosalin Hawking  Encounter Date: 11/09/2015      OT End of Session - 11/09/15 1625    Visit Number 8   Number of Visits 17   Date for OT Re-Evaluation 01/05/16   Authorization Type self pay   OT Start Time 1317   OT Stop Time 1400   OT Time Calculation (min) 43 min   Activity Tolerance Patient tolerated treatment well   Behavior During Therapy [facility] for tasks assessed/performed      Past Medical History  Diagnosis Date  . Stroke ([place])   . Arthritis   . Seizures [facility])     Past Surgical History  Procedure Laterality Date  . Hernia repair  2001    There were no vitals filed for this visit.      Subjective Assessment - 11/09/15 1620    Subjective  Patient with Botox injections to left UE/LE last week   Patient is accompained by: Family member   Currently in Pain? No/denies   Pain Score 0-No pain                      OT Treatments/Exercises (OP) - 11/09/15 0001    Splinting   Splinting Fabricated long arm splint to promote stretch to elbow flexors, and increase end range of elbow extension.  Patient has had intermittent reports of elbow discomfort with passive range of motion at elbow, am hoping to reduce tightness around elbow joint, and promote better alignment / range.  Patient's grand daughter present intermittently during this session. Reviewed process to don / doff splint, and requested that family have patient wear splint at rest.  Requested that family put splint on and see if patient can tolerate fopr 2 hours,and note any areas of redness or discomfort.  Patient's family instructed to bring splint back for finishing next session.                  OT  Education - 11/09/15 1625    Education provided Yes   Education Details don/doff, wearing schedule splint, watch for redness, swelling, discomfort - remove if any of these signs present.  Bring splint to next session for finishing.    Person(s) Educated Patient;Caregiver(s)   Methods Explanation;Demonstration   Comprehension Verbalized understanding          OT Short Term Goals - 11/09/15 1631    OT SHORT TERM GOAL #1   Title Patient will report no greater than 3/10 pain with elbow flexion and extension in HEP , due 10/05/14   Status Achieved   OT SHORT TERM GOAL #2   Title Patient will demonstrate 5 degree increase in shoulder flexion to aide personal care with upper body bathing, and dressing   Status Not Met   OT SHORT TERM GOAL #3   Title Patient will assist caregiver in left arm management when rolling from supine to sidelying on right side with visual / verbal cueing.   Status Not Met   OT SHORT TERM GOAL #4   Title Patient will transition from sit to stand from chair without pulling up on surface with right arm to aide in repositioning self for comfort when alone in apartment during the afternoon   Status Achieved   OT  SHORT TERM GOAL #5   Title Patient will transfer to toilet with mod assist, and stand safely to allow clothing management   Status Achieved           OT Long Term Goals - 11/09/15 1631    OT LONG TERM GOAL #1   Title Patient and caregiver will independently complete HEP  for LUE with pain report no greater than 1-2/10   Status On-going   OT LONG TERM GOAL #2   Title Patient will transfer to toilet with min assist   Status On-going   OT LONG TERM GOAL #3   Title Patient will complete toilet hygiene with min assist   Status On-going   OT LONG TERM GOAL #4   Title Patient will roll from supine to/from sidelying with minimal assistance to reposition self in bed for comfort   Status On-going   OT LONG TERM GOAL #5   Title Patient will demonstrate 10  degree increase in shoulder flexion to aide in personal grooming, bathing and upper body dressing   Status On-going   OT LONG TERM GOAL #6   Title Patient will grasp and release 1-3 inch object, with left hand, with minimal assistance,  as part of bimanual ADL task   Status On-going               Plan - 11/09/15 1627    Clinical Impression Statement Patient has had poor attendance this recording period due to multiple family issues - transportation issues, etc.  Patient has received additional Botox injections to reduce spasticity in left extremities, although has had limited improvement in volitional control in left UE, although significant reduction in pain noted.  Will continue with therapy to finalize visits, by extending date of service.    Rehab Potential Good   Clinical Impairments Affecting Rehab Potential limited financial resources   OT Frequency 2x / week   OT Duration 8 weeks   OT Treatment/Interventions Self-care/ADL training;Electrical Stimulation;Cryotherapy;Moist Heat;Visual/perceptual remediation/compensation;Patient/family education;Balance training;Splinting;Therapeutic exercises;Therapeutic activities;Neuromuscular education;Functional Mobility Training;Fluidtherapy;Biofeedback;DME and/or AE instruction;Cognitive remediation/compensation;Ultrasound   Plan Finish long arm splint, NMR LUE, stand balance with ADL   OT Home Exercise Plan on going   Consulted and Agree with Plan of Care Patient;Family member/caregiver   Family Member [employer] daughter      Patient will benefit from skilled therapeutic intervention in order to improve the following deficits and impairments:  Decreased activity tolerance, Decreased balance, Decreased coordination, Decreased endurance, Impaired flexibility, Impaired perceived functional ability, Decreased safety awareness, Decreased range of motion, Decreased mobility, Decreased knowledge of use of DME, Decreased knowledge of  precautions, Decreased strength, Increased edema, Increased muscle spasms, Impaired sensation, Pain, Impaired tone, Impaired UE functional use, Impaired vision/preception  Visit Diagnosis: Unsteadiness on feet - Plan: Ot plan of care cert/re-cert  Pain, elbow joint, left - Plan: Ot plan of care cert/re-cert  Pain in joint, shoulder region, left - Plan: Ot plan of care cert/re-cert  Stiffness of shoulder joint, left - Plan: Ot plan of care cert/re-cert  Spastic hemiplegia affecting left nondominant side ([place]) - Plan: Ot plan of care cert/re-cert    Problem List Patient Active Problem List   Diagnosis Date Noted  . Nontraumatic subcortical hemorrhage of right cerebral hemisphere ([place]) 09/15/2015  . Seizures ([place]) 06/21/2015  . Hemorrhage, intracerebral ([place]) 02/03/2015  . Hemiparesis ([place]) 02/03/2015  . Spastic hemiplegia affecting nondominant side ([place]) 02/03/2015    Mariah Milling, OTR/L 11/09/2015, 4:46 PM  [place]  [address] [place], [place], [address] Phone: [phone]   Fax:  [phone]  Name: Haadiya Frogge MRN: [id number] Date of Birth: [birth year]

## 2015-11-10 ENCOUNTER — Ambulatory Visit: Payer: Self-pay | Admitting: Physical Therapy

## 2015-11-10 ENCOUNTER — Encounter: Payer: Self-pay | Admitting: Occupational Therapy

## 2015-11-10 DIAGNOSIS — R2689 Other abnormalities of gait and mobility: Secondary | ICD-10-CM

## 2015-11-10 DIAGNOSIS — R293 Abnormal posture: Secondary | ICD-10-CM

## 2015-11-10 DIAGNOSIS — G8114 Spastic hemiplegia affecting left nondominant side: Secondary | ICD-10-CM

## 2015-11-10 NOTE — Therapy (Signed)
[facility] [address] [place], [place], [address] Phone: [phone]   Fax:  [phone]  Physical Therapy Treatment  Patient Details  Name: Michelle Kemp MRN: [id number] Date of Birth: 16-Feb-1941 Referring Provider: Dr. Erlinda Hong  Encounter Date: 11/10/2015      PT End of Session - 11/10/15 1951    Visit Number 8   Number of Visits 17   Date for PT Re-Evaluation 01/09/16   Authorization Type Self pay   PT Start Time 1312   PT Stop Time 1400   PT Time Calculation (min) 48 min   Equipment Utilized During Treatment Gait belt   Activity Tolerance Patient limited by fatigue  Seated rest breaks required.   Behavior During Therapy [facility] for tasks assessed/performed      Past Medical History  Diagnosis Date  . Stroke ([place])   . Arthritis   . Seizures [facility])     Past Surgical History  Procedure Laterality Date  . Hernia repair  2001    There were no vitals filed for this visit.      Subjective Assessment - 11/10/15 1949    Subjective Daughter present with pt today. Pt received botox on 4/25. Daughter reports noticing a "big difference already".    Patient is accompained by: Family member  dtr, [name]   Patient Stated Goals To walk again    Currently in Pain? No/denies            [facility] PT Assessment - 11/10/15 0001    LUE Tone   Modified Ashworth Scale for Grading Hypertonia LUE Slight increase in muscle tone, manifested by a catch and release or by minimal resistance at the end of the range of motion when the affected part(s) is moved in flexion or extension  L quadriceps                     [facility] Adult PT Treatment/Exercise - 11/10/15 0001    Transfers   Transfers Sit to Stand;Stand to Sit   Sit to Stand 4: Min guard   Sit to Stand Details (indicate cue type and reason) from w/c and from mat table with hemi walker with min guard, tactile cueing for anterior weight shift.   Stand to Sit 4:  Min guard   Stand Pivot Transfers 3: Mod assist   Stand Pivot Transfer Details (indicate cue type and reason) manual facilitation of lateral weight shift during pivoting; using hemi walker   Supine to Sit 5: Supervision   Sit to Supine 3: Mod assist   Sit to Supine Details  mod A for BLE management due to pt difficulty managing LLE using RLE. Demonstrated logroll technique for daughter, who will attempt at home (ppt did not attempt today due to fatigue).   Ambulation/Gait   Ambulation/Gait Yes   Ambulation/Gait Assistance 1: +2 Total assist;3: Mod assist   Ambulation/Gait Assistance Details x48' with hemi walker and mod A with modified toe cap on L shoe; x60' with R HHA and L forearm support; cueing for upright posture, normalized (increased) B step length, and for neutral trunk rotation (to address posterior rotation of trunk on L).   Ambulation Distance (Feet) 48 Feet  then 60'   Assistive device Hemi-walker  modified toe cap on L shoe   Gait Pattern Decreased stance time - left;Decreased step length - left;Decreased hip/knee flexion - left;Decreased dorsiflexion - left;Decreased weight shift to left;Narrow base of support;Trunk rotated posteriorly on left;Step-to pattern;Step-through pattern;Poor foot  clearance - left  L hip ADD/IR   Ambulation Surface Level;Indoor                PT Education - 11/10/15 1948    Education provided Yes   Education Details Discussed goals, progress, and POC. Plan for 1x/week for 8 additional weeks. Addressed daughter's question regarding LE bracing.   Person(s) Educated Patient;Caregiver(s)   Methods Explanation   Comprehension Verbalized understanding          PT Short Term Goals - 11/10/15 1932    PT SHORT TERM GOAL #1   Title The patient will perform HEP at MOD I level (with assist from family) in order to improve balance, strength, and flexibility. Target date: 10/08/15   Baseline Met 5/3.   Status Achieved   PT SHORT TERM GOAL #2    Title The patient will consistently perform sit<>stand with min A and 25% cueing to indicate increased independence during transfers.  Modified Target date: 10/08/15   Baseline Met 5/3.   Status Achieved   PT SHORT TERM GOAL #3   Title Assess bed mobility and write goal. Target date: 10/08/15   Baseline Met 3/8.   Status Achieved   PT SHORT TERM GOAL #4   Title Perform w/c to mat transfer and write goal. Target date: 10/08/15   Baseline Met 3/8.   Status Achieved   PT SHORT TERM GOAL #5   Title The patient will ambulate 44' with RW with max A and +1 assist to improve functional mobility. Target date 10/08/15   Baseline Met 5/3 with hemi walker and mod A.   Status Achieved   PT SHORT TERM GOAL #6   Title Pt will consistently perform sit > supine with Min A, min cueing for technique to indicate increased independence getting into bed.  (Modified target date: 12/08/15)    Baseline 5/3: continues to require Mod A for BLE management  Continue as STG through renewed POC.   Status Not Met   PT SHORT TERM GOAL #7   Title Pt will demo ability to bridge to scoot in all directions (superior, inferior, R/L) with supervision, min cueing to indicate increased independence with bed mobility.   (Modified target date: 12/08/15)    Baseline Requires min A to stabilize LLE.  Continue as STG through renewed POC.   Status Partially Met   PT SHORT TERM GOAL #8   Title Pt will perform stand pivot transfer from w/c <>  mat with Mod A to decrease physical burden on caregivers.   Target date 10/08/15   Baseline Met 5/3.   Status Achieved           PT Long Term Goals - 11/10/15 1934    PT LONG TERM GOAL #1   Title Pt will amb. 73' with RW and mod A to improve functional mobility.  (Modified target date: 01/05/16)   Baseline 5/2: Gait x48' with hemi walker and mod A; however, inconsistent.  Continue goal through renewed POC   Status Not Met   PT LONG TERM GOAL #2   Title Pt will perform seated scooting in to L  and R with supervision, min cueing to increase pt independence with functional mobility. (Modified target date: 01/05/16)   Baseline 5/3: Inconsistent performance of seated scooting with supervision; requires min A at times.  Continue goal through renewed POC   Status Partially Met   PT LONG TERM GOAL #3   Title The patient will perform sit <> stand with  CGA and RW to improve functional mobility. Target date: 11/05/15    Baseline Met 5/3.   Status Achieved   PT LONG TERM GOAL #4   Title Pt will perform stand pivot transfer from w/c <>  mat with Min A to decrease physical burden on caregivers.  (Modified target date: 01/05/16)   Baseline 5/3: Mod A using hem walker  Continue goal through renewed POC   Status Partially Met   PT LONG TERM GOAL #5   Title Pt will consistently perform supine > sit with supervision, min cueing for technique to indicate increased independence getting out of bed.  Target date: 11/05/15    Baseline Met 5/3.   Status Achieved   PT LONG TERM GOAL #6   Title Pt will consistently perform sit > supine with supervision, min cueing for technique to indicate increased independence getting into bed. (Modified target date: 01/05/16)   Baseline 5/3: Continues to require mod A for BLE management.  Continue goal through renewed POC   Status Not Met               Plan - 11/10/15 1953    Clinical Impression Statement Frequency of PT sessions has been limited by patient illness then awaiting botox injections to maximize effectiveness of PT. Despite less visits than anticipated, pt did partially meet short and long term goals. Pt did receive botox injections in L VMO, rectus femoris, and vastus lateralis on 4/25. Pt will continue to benefit from skilled outpatient PT 1x/week for 8 weeks to continue to maximize pt independence with functional mobiliy. Continue current short and long term goals through renewed POC.    Rehab Potential Good   Clinical Impairments Affecting Rehab  Potential patient self pay for services   PT Frequency 2x / week   PT Duration 8 weeks   PT Treatment/Interventions Electrical Stimulation;Gait training;Stair training;Neuromuscular re-education;Balance training;Therapeutic exercise;Therapeutic activities;Functional mobility training;Patient/family education;Wheelchair mobility training;Manual techniques;Orthotic Fit/Training;ADLs/Self Care Home Management;Biofeedback;DME Instruction   PT Next Visit Plan Revisit sit > supine and seated scooting.  Contact [employer] RE: L GRAFO (? self pay)   Consulted and Agree with Plan of Care Patient;Family member/caregiver   Family Member Consulted daughter, [name]      Patient will benefit from skilled therapeutic intervention in order to improve the following deficits and impairments:  Abnormal gait, Decreased balance, Decreased mobility, Decreased strength, Postural dysfunction, Impaired flexibility, Pain, Decreased activity tolerance, Decreased endurance, Increased muscle spasms, Difficulty walking, Impaired tone  Visit Diagnosis: Other abnormalities of gait and mobility - Plan: PT plan of care cert/re-cert  Spastic hemiplegia affecting left nondominant side (HCC) - Plan: PT plan of care cert/re-cert  Abnormal posture - Plan: PT plan of care cert/re-cert     Problem List Patient Active Problem List   Diagnosis Date Noted  . Nontraumatic subcortical hemorrhage of right cerebral hemisphere ([place]) 09/15/2015  . Seizures ([place]) 06/21/2015  . Hemorrhage, intracerebral ([place]) 02/03/2015  . Hemiparesis ([place]) 02/03/2015  . Spastic hemiplegia affecting nondominant side ([place]) 02/03/2015    Billie Ruddy, PT, DPT [facility] [address] [place], [place], [address] Phone: [phone]   Fax:  [phone] 11/10/2015, 8:01 PM  Name: Michelle Kemp MRN: [id number] Date of Birth: [birth year]

## 2015-11-25 ENCOUNTER — Other Ambulatory Visit: Payer: Self-pay

## 2015-11-25 ENCOUNTER — Telehealth: Payer: Self-pay | Admitting: *Deleted

## 2015-11-25 ENCOUNTER — Ambulatory Visit: Payer: Self-pay | Admitting: Physical Therapy

## 2015-11-25 DIAGNOSIS — R2689 Other abnormalities of gait and mobility: Secondary | ICD-10-CM

## 2015-11-25 DIAGNOSIS — R569 Unspecified convulsions: Secondary | ICD-10-CM

## 2015-11-25 DIAGNOSIS — R293 Abnormal posture: Secondary | ICD-10-CM

## 2015-11-25 DIAGNOSIS — G8114 Spastic hemiplegia affecting left nondominant side: Secondary | ICD-10-CM

## 2015-11-25 MED ORDER — LEVETIRACETAM 500 MG PO TABS: 500.0000 mg | ORAL_TABLET | Freq: Two times a day (BID) | ORAL | Status: DC

## 2015-11-25 NOTE — Telephone Encounter (Signed)
Rn call patients daughter Amil AmenJulia about her mom changing pharmacy. Amil AmenJulia stated her mom goes to [facility] on [address].[facility]ra sent to [facility].

## 2015-11-26 NOTE — Therapy (Signed)
[facility] [address] [place], [place], [address] Phone: [phone]   Fax:  [phone]  Physical Therapy Treatment  Patient Details  Name: Michelle Kemp MRN: [id number] Date of Birth: 03/14/1941 Referring Provider: Dr. Erlinda Hong  Encounter Date: 11/25/2015      PT End of Session - 11/25/15 1326    Visit Number 9   Number of Visits 17   Date for PT Re-Evaluation 01/09/16   Authorization Type Self pay   PT Start Time 1318   PT Stop Time 1400   PT Time Calculation (min) 42 min   Equipment Utilized During Treatment Gait belt   Activity Tolerance Patient limited by fatigue  Seated rest breaks required.   Behavior During Therapy [facility] for tasks assessed/performed      Past Medical History  Diagnosis Date  . Stroke ([place])   . Arthritis   . Seizures [facility])     Past Surgical History  Procedure Laterality Date  . Hernia repair  2001    There were no vitals filed for this visit.      Subjective Assessment - 11/25/15 1325    Subjective Daughter present with pt today. Reports they have pt using quad cane at home.   Patient is accompained by: Family member  daugther [name]   Pertinent History Seizures, pt's dtr reports pt is allergic to Delsym and OTC motrin   Patient Stated Goals To walk again    Currently in Pain? No/denies   Pain Score 0-No pain            [facility] Adult PT Treatment/Exercise - 11/25/15 1340    Transfers   Transfers Sit to Stand;Stand to Sit   Sit to Stand 4: Min guard;With upper extremity assist;With armrests;From chair/3-in-1   Stand to Sit 4: Min guard;With upper extremity assist;With armrests;To chair/3-in-1   Ambulation/Gait   Ambulation/Gait Yes   Ambulation/Gait Assistance 4: Min assist  plus 2cd person for safety   Ambulation/Gait Assistance Details added simulated toe cap to left foot on last 2 gait trials with slight improvement in left leg advancement.    Ambulation Distance  (Feet) 20 Feet  x1, 26 x1, 56 x1   Assistive device Large base quad cane   Gait Pattern Decreased stance time - left;Decreased step length - left;Decreased hip/knee flexion - left;Decreased dorsiflexion - left;Decreased weight shift to left;Narrow base of support;Trunk rotated posteriorly on left;Step-to pattern;Step-through pattern;Poor foot clearance - left   Ambulation Surface Level;Indoor            PT Short Term Goals - 11/10/15 1932    PT SHORT TERM GOAL #1   Title The patient will perform HEP at MOD I level (with assist from family) in order to improve balance, strength, and flexibility. Target date: 10/08/15   Baseline Met 5/3.   Status Achieved   PT SHORT TERM GOAL #2   Title The patient will consistently perform sit<>stand with min A and 25% cueing to indicate increased independence during transfers.  Modified Target date: 10/08/15   Baseline Met 5/3.   Status Achieved   PT SHORT TERM GOAL #3   Title Assess bed mobility and write goal. Target date: 10/08/15   Baseline Met 3/8.   Status Achieved   PT SHORT TERM GOAL #4   Title Perform w/c to mat transfer and write goal. Target date: 10/08/15   Baseline Met 3/8.   Status Achieved   PT SHORT TERM GOAL #5   Title The patient  will ambulate 20' with RW with max A and +1 assist to improve functional mobility. Target date 10/08/15   Baseline Met 5/3 with hemi walker and mod A.   Status Achieved   PT SHORT TERM GOAL #6   Title Pt will consistently perform sit > supine with Min A, min cueing for technique to indicate increased independence getting into bed.  (Modified target date: 12/08/15)    Baseline 5/3: continues to require Mod A for BLE management  Continue as STG through renewed POC.   Status Not Met   PT SHORT TERM GOAL #7   Title Pt will demo ability to bridge to scoot in all directions (superior, inferior, R/L) with supervision, min cueing to indicate increased independence with bed mobility.   (Modified target date:  12/08/15)    Baseline Requires min A to stabilize LLE.  Continue as STG through renewed POC.   Status Partially Met   PT SHORT TERM GOAL #8   Title Pt will perform stand pivot transfer from w/c <>  mat with Mod A to decrease physical burden on caregivers.   Target date 10/08/15   Baseline Met 5/3.   Status Achieved           PT Long Term Goals - 11/10/15 1934    PT LONG TERM GOAL #1   Title Pt will amb. 7' with RW and mod A to improve functional mobility.  (Modified target date: 01/05/16)   Baseline 5/2: Gait x48' with hemi walker and mod A; however, inconsistent.  Continue goal through renewed POC   Status Not Met   PT LONG TERM GOAL #2   Title Pt will perform seated scooting in to L and R with supervision, min cueing to increase pt independence with functional mobility. (Modified target date: 01/05/16)   Baseline 5/3: Inconsistent performance of seated scooting with supervision; requires min A at times.  Continue goal through renewed POC   Status Partially Met   PT LONG TERM GOAL #3   Title The patient will perform sit <> stand with CGA and RW to improve functional mobility. Target date: 11/05/15    Baseline Met 5/3.   Status Achieved   PT LONG TERM GOAL #4   Title Pt will perform stand pivot transfer from w/c <>  mat with Min A to decrease physical burden on caregivers.  (Modified target date: 01/05/16)   Baseline 5/3: Mod A using hem walker  Continue goal through renewed POC   Status Partially Met   PT LONG TERM GOAL #5   Title Pt will consistently perform supine > sit with supervision, min cueing for technique to indicate increased independence getting out of bed.  Target date: 11/05/15    Baseline Met 5/3.   Status Achieved   PT LONG TERM GOAL #6   Title Pt will consistently perform sit > supine with supervision, min cueing for technique to indicate increased independence getting into bed. (Modified target date: 01/05/16)   Baseline 5/3: Continues to require mod A for BLE  management.  Continue goal through renewed POC   Status Not Met            Plan - 11/25/15 1326    Clinical Impression Statement Pt's family has transitioned her to large based quad cane at home, therefore used with therapy today. Pt making gain in gait, however continues to need cues for upright posture, weight shifting and cane position with gait. Has progressed to min assist for balalnce with gait with 2  people assist for facilitation of posture and to correct gait deviations. Pt is making steady progress toward goals                               Rehab Potential Good   Clinical Impairments Affecting Rehab Potential patient self pay for services   PT Frequency 2x / week   PT Duration 8 weeks   PT Treatment/Interventions Electrical Stimulation;Gait training;Stair training;Neuromuscular re-education;Balance training;Therapeutic exercise;Therapeutic activities;Functional mobility training;Patient/family education;Wheelchair mobility training;Manual techniques;Orthotic Fit/Training;ADLs/Self Care Home Management;Biofeedback;DME Instruction   PT Next Visit Plan Revisit sit > supine and seated scooting.  Contact [employer] RE: L GRAFO (? self pay)   Consulted and Agree with Plan of Care Patient;Family member/caregiver   Family Member Consulted daughter, [name]      Patient will benefit from skilled therapeutic intervention in order to improve the following deficits and impairments:  Abnormal gait, Decreased balance, Decreased mobility, Decreased strength, Postural dysfunction, Impaired flexibility, Pain, Decreased activity tolerance, Decreased endurance, Increased muscle spasms, Difficulty walking, Impaired tone  Visit Diagnosis: Other abnormalities of gait and mobility  Spastic hemiplegia affecting left nondominant side (HCC)  Abnormal posture     Problem List Patient Active Problem List   Diagnosis Date Noted  . Nontraumatic subcortical hemorrhage of right cerebral hemisphere ([place])  09/15/2015  . Seizures ([place]) 06/21/2015  . Hemorrhage, intracerebral ([place]) 02/03/2015  . Hemiparesis ([place]) 02/03/2015  . Spastic hemiplegia affecting nondominant side ([place]) 02/03/2015    Willow Ora, PTA, [facility] Outpatient Neuro [facility] [address], [place], [place] [address] [phone] 11/26/2015, 7:47 PM   Name: Michelle Kemp MRN: [id number] Date of Birth: 1941/03/15

## 2015-12-01 ENCOUNTER — Ambulatory Visit: Payer: Self-pay | Admitting: Physical Therapy

## 2015-12-01 ENCOUNTER — Encounter: Payer: Self-pay | Admitting: Physical Therapy

## 2015-12-01 DIAGNOSIS — R2689 Other abnormalities of gait and mobility: Secondary | ICD-10-CM

## 2015-12-01 DIAGNOSIS — R293 Abnormal posture: Secondary | ICD-10-CM

## 2015-12-01 DIAGNOSIS — R2681 Unsteadiness on feet: Secondary | ICD-10-CM

## 2015-12-01 DIAGNOSIS — G8114 Spastic hemiplegia affecting left nondominant side: Secondary | ICD-10-CM

## 2015-12-02 NOTE — Therapy (Signed)
[facility] [address] [place], [place], [address] Phone: [phone]   Fax:  [phone]  Physical Therapy Treatment  Patient Details  Name: Michelle Kemp MRN: [id number] Date of Birth: 1940/12/15 Referring Provider: Dr. Erlinda Hong  Encounter Date: 12/01/2015      PT End of Session - 12/01/15 1323    Visit Number 10   Number of Visits 17   Date for PT Re-Evaluation 01/09/16   Authorization Type Self pay   PT Start Time 6962   PT Stop Time 1400   PT Time Calculation (min) 43 min   Equipment Utilized During Treatment Gait belt   Activity Tolerance Patient limited by fatigue  Seated rest breaks required.   Behavior During Therapy [facility] for tasks assessed/performed      Past Medical History  Diagnosis Date  . Stroke ([place])   . Arthritis   . Seizures [facility])     Past Surgical History  Procedure Laterality Date  . Hernia repair  2001    There were no vitals filed for this visit.      Subjective Assessment - 12/01/15 1322    Subjective No new complaints. [place] daughter reports no falls and no pain per pt report.   Patient is accompained by: Family member  grand daughter   Pertinent History Seizures, pt's dtr reports pt is allergic to Delsym and OTC motrin   Patient Stated Goals To walk again    Currently in Pain? No/denies   Pain Score 0-No pain           [facility] Adult PT Treatment/Exercise - 12/01/15 1330    Bed Mobility   Rolling Right 4: Min guard  x 3 reps   Rolling Right Details (indicate cue type and reason) cues on sequencing/technique, light assist/guidance needed to encorporate left UE/bring shoulder forward to complete the roll, decreased cues as reps progressed   Rolling Left 5: Supervision  x 3 reps   Rolling Left Details (indicate cue type and reason) cues on sequencing needed initially   Supine to Sit 4: Min assist  for left UE mangement only.   Supine to Sit Details (indicate cue type and  reason) to fully clear edge of mat with left LE, pt able to bring trunk down in controlled manner   Sit to Supine 5: Supervision   Sit to Supine - Details (indicate cue type and reason) from left side lying to sit at edge of bed, cues only needed, no physical assistance needed.   Transfers   Transfers Sit to Stand;Stand to Sit   Sit to Stand 4: Min guard;4: Min assist   Sit to Stand Details Verbal cues for technique;Verbal cues for sequencing;Tactile cues for weight shifting;Verbal cues for precautions/safety   Sit to Stand Details (indicate cue type and reason) pt responds well to light hand pressure to lower back/upper back to encourage forward weight shifting and to bring trunk forward   Sit to Stand: Patient Percentage 90%  90-100 % with repetition   Stand to Sit 4: Min guard;With upper extremity assist;With armrests;To chair/3-in-1;4: Min assist   Stand to Sit: Patient Percentage 90%   Stand to Sit Details (indicate cue type and reason) Verbal cues for sequencing;Verbal cues for technique;Verbal cues for precautions/safety;Manual facilitation for weight shifting   Stand to Sit Details cues to reach back needed at times. uncontrolled desent with cues to turn completly to surface prior to sitting with 2 of 4 reps. pt tends to hurry and try  to swing buttocks around to sit when fatigued, needing increased assistance for safety                      Stand Pivot Transfers 4: Min assist   Stand Pivot Transfer Details (indicate cue type and reason) wheelchair to mat table with cues on posture and sequecing, pt's right hand on PTA shoulder for support   Ambulation/Gait   Ambulation/Gait Yes   Ambulation/Gait Assistance 4: Min assist  of 2 people for safety   Ambulation/Gait Assistance Details utilized simulated toe cap for all gait. had chairs set up around gym as targets for pt (she does better when she had a specified destination). verbal cues and manual facilitaion for upright posture, increased  step length and to increase weight shifting. occasional assistance needed to advance left foot forward and for left foot placement                                     Ambulation Distance (Feet) 40 Feet  x2, 45 x1   Assistive device Large base quad cane   Gait Pattern Decreased stance time - left;Decreased step length - left;Decreased hip/knee flexion - left;Decreased dorsiflexion - left;Decreased weight shift to left;Narrow base of support;Trunk rotated posteriorly on left;Step-to pattern;Step-through pattern;Poor foot clearance - left   Ambulation Surface Level;Indoor             PT Short Term Goals - 11/10/15 1932    PT SHORT TERM GOAL #1   Title The patient will perform HEP at MOD I level (with assist from family) in order to improve balance, strength, and flexibility. Target date: 10/08/15   Baseline Met 5/3.   Status Achieved   PT SHORT TERM GOAL #2   Title The patient will consistently perform sit<>stand with min A and 25% cueing to indicate increased independence during transfers.  Modified Target date: 10/08/15   Baseline Met 5/3.   Status Achieved   PT SHORT TERM GOAL #3   Title Assess bed mobility and write goal. Target date: 10/08/15   Baseline Met 3/8.   Status Achieved   PT SHORT TERM GOAL #4   Title Perform w/c to mat transfer and write goal. Target date: 10/08/15   Baseline Met 3/8.   Status Achieved   PT SHORT TERM GOAL #5   Title The patient will ambulate 13' with RW with max A and +1 assist to improve functional mobility. Target date 10/08/15   Baseline Met 5/3 with hemi walker and mod A.   Status Achieved   PT SHORT TERM GOAL #6   Title Pt will consistently perform sit > supine with Min A, min cueing for technique to indicate increased independence getting into bed.  (Modified target date: 12/08/15)    Baseline 5/3: continues to require Mod A for BLE management  Continue as STG through renewed POC.   Status Not Met   PT SHORT TERM GOAL #7   Title Pt will demo  ability to bridge to scoot in all directions (superior, inferior, R/L) with supervision, min cueing to indicate increased independence with bed mobility.   (Modified target date: 12/08/15)    Baseline Requires min A to stabilize LLE.  Continue as STG through renewed POC.   Status Partially Met   PT SHORT TERM GOAL #8   Title Pt will perform stand pivot transfer from w/c <>  mat with  Mod A to decrease physical burden on caregivers.   Target date 10/08/15   Baseline Met 5/3.   Status Achieved           PT Long Term Goals - 11/10/15 1934    PT LONG TERM GOAL #1   Title Pt will amb. 56' with RW and mod A to improve functional mobility.  (Modified target date: 01/05/16)   Baseline 5/2: Gait x48' with hemi walker and mod A; however, inconsistent.  Continue goal through renewed POC   Status Not Met   PT LONG TERM GOAL #2   Title Pt will perform seated scooting in to L and R with supervision, min cueing to increase pt independence with functional mobility. (Modified target date: 01/05/16)   Baseline 5/3: Inconsistent performance of seated scooting with supervision; requires min A at times.  Continue goal through renewed POC   Status Partially Met   PT LONG TERM GOAL #3   Title The patient will perform sit <> stand with CGA and RW to improve functional mobility. Target date: 11/05/15    Baseline Met 5/3.   Status Achieved   PT LONG TERM GOAL #4   Title Pt will perform stand pivot transfer from w/c <>  mat with Min A to decrease physical burden on caregivers.  (Modified target date: 01/05/16)   Baseline 5/3: Mod A using hem walker  Continue goal through renewed POC   Status Partially Met   PT LONG TERM GOAL #5   Title Pt will consistently perform supine > sit with supervision, min cueing for technique to indicate increased independence getting out of bed.  Target date: 11/05/15    Baseline Met 5/3.   Status Achieved   PT LONG TERM GOAL #6   Title Pt will consistently perform sit > supine with  supervision, min cueing for technique to indicate increased independence getting into bed. (Modified target date: 01/05/16)   Baseline 5/3: Continues to require mod A for BLE management.  Continue goal through renewed POC   Status Not Met             Plan - 12/01/15 1323    Clinical Impression Statement During today's skilled session revisisted bed mobility and supine<>sit transfers with pt showing progress since it was last addressed. Remainder of session continued to address gait with large based quad cane. Pt is making slow, steady progress toward goals.   Rehab Potential Good   Clinical Impairments Affecting Rehab Potential patient self pay for services   PT Frequency 2x / week   PT Duration 8 weeks   PT Treatment/Interventions Electrical Stimulation;Gait training;Stair training;Neuromuscular re-education;Balance training;Therapeutic exercise;Therapeutic activities;Functional mobility training;Patient/family education;Wheelchair mobility training;Manual techniques;Orthotic Fit/Training;ADLs/Self Care Home Management;Biofeedback;DME Instruction   PT Next Visit Plan Contact [employer] RE: L GRAFO (? self pay). work on scooting as pt needs max cues on lateral/anterior<>posterior scooting, gait wtih LBQC   Consulted and Agree with Plan of Care Patient;Family member/caregiver   Family Member Consulted daughter, [name]      Patient will benefit from skilled therapeutic intervention in order to improve the following deficits and impairments:  Abnormal gait, Decreased balance, Decreased mobility, Decreased strength, Postural dysfunction, Impaired flexibility, Pain, Decreased activity tolerance, Decreased endurance, Increased muscle spasms, Difficulty walking, Impaired tone  Visit Diagnosis: Other abnormalities of gait and mobility  Spastic hemiplegia affecting left nondominant side (HCC)  Abnormal posture  Unsteadiness on feet     Problem List Patient Active Problem List   Diagnosis  Date Noted  .  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 Seizures ([place]) 06/21/2015  . Hemorrhage, intracerebral ([place]) 02/03/2015  . Hemiparesis ([place]) 02/03/2015  . Spastic hemiplegia affecting nondominant side ([place]) 02/03/2015    Willow Ora, PTA, [facility] [address], [place], [place] [address] [phone] 12/02/2015, 1:34 PM   Name: Michelle Kemp MRN: [id number] Date of Birth: 11-29-1940

## 2015-12-09 ENCOUNTER — Ambulatory Visit: Payer: Self-pay | Attending: Neurology | Admitting: Physical Therapy

## 2015-12-09 DIAGNOSIS — R293 Abnormal posture: Secondary | ICD-10-CM | POA: Insufficient documentation

## 2015-12-09 DIAGNOSIS — R2689 Other abnormalities of gait and mobility: Secondary | ICD-10-CM | POA: Insufficient documentation

## 2015-12-09 DIAGNOSIS — G8114 Spastic hemiplegia affecting left nondominant side: Secondary | ICD-10-CM | POA: Insufficient documentation

## 2015-12-09 DIAGNOSIS — R2681 Unsteadiness on feet: Secondary | ICD-10-CM | POA: Insufficient documentation

## 2015-12-14 ENCOUNTER — Ambulatory Visit: Payer: Self-pay | Admitting: Physical Medicine & Rehabilitation

## 2015-12-17 ENCOUNTER — Ambulatory Visit: Payer: Self-pay | Admitting: Physical Therapy

## 2015-12-20 ENCOUNTER — Ambulatory Visit: Payer: Self-pay | Admitting: Physical Therapy

## 2015-12-24 ENCOUNTER — Ambulatory Visit: Payer: Self-pay | Admitting: Physical Therapy

## 2015-12-24 ENCOUNTER — Encounter: Payer: Self-pay | Admitting: Physical Therapy

## 2015-12-24 DIAGNOSIS — R2689 Other abnormalities of gait and mobility: Secondary | ICD-10-CM

## 2015-12-24 DIAGNOSIS — R2681 Unsteadiness on feet: Secondary | ICD-10-CM

## 2015-12-24 DIAGNOSIS — R293 Abnormal posture: Secondary | ICD-10-CM

## 2015-12-24 DIAGNOSIS — G8114 Spastic hemiplegia affecting left nondominant side: Secondary | ICD-10-CM

## 2015-12-25 NOTE — Therapy (Signed)
[facility] [address] [place], [place], [address] Phone: [phone]   Fax:  [phone]  Physical Therapy Treatment  Patient Details  Name: Michelle Kemp MRN: [id number] Date of Birth: [birth year] Referring Provider: Dr. Erlinda Hong  Encounter Date: 12/24/2015      PT End of Session - 12/24/15 1827    Visit Number 10   Number of Visits 17   Date for PT Re-Evaluation 01/09/16   Authorization Type Self pay   PT Start Time 1450   PT Stop Time 1530   PT Time Calculation (min) 40 min   Equipment Utilized During Treatment Gait belt   Activity Tolerance Patient limited by fatigue;Patient tolerated treatment well  Seated rest breaks required.   Behavior During Therapy [facility] for tasks assessed/performed      Past Medical History  Diagnosis Date  . Stroke ([place])   . Arthritis   . Seizures [facility])     Past Surgical History  Procedure Laterality Date  . Hernia repair  2001    There were no vitals filed for this visit.      Subjective Assessment - 12/24/15 1455    Subjective No new complaints. [place] daughter reports no falls and no pain per pt report.   Patient is accompained by: Family member  daughter [name]   Pertinent History Seizures, pt's dtr reports pt is allergic to Delsym and OTC motrin   Patient Stated Goals To walk again    Currently in Pain? No/denies   Pain Score 0-No pain           [facility] Adult PT Treatment/Exercise - 12/24/15 1455    Transfers   Transfers Sit to Stand;Stand to Sit   Sit to Stand 4: Min guard;4: Min assist;With upper extremity assist;With armrests;From chair/3-in-1   Sit to Stand Details Verbal cues for technique;Verbal cues for sequencing;Tactile cues for weight shifting;Verbal cues for precautions/safety   Stand to Sit 4: Min guard;With upper extremity assist;With armrests;To chair/3-in-1;4: Min assist   Stand to Sit Details (indicate cue type and reason) Verbal cues for  sequencing;Verbal cues for technique;Verbal cues for precautions/safety;Manual facilitation for weight shifting   Stand Pivot Transfers 4: Min assist;4: Min guard   Stand Pivot Transfer Details (indicate cue type and reason) wheelchair to mat table   Ambulation/Gait   Ambulation/Gait Yes   Ambulation/Gait Assistance 4: Min assist   Ambulation/Gait Assistance Details continued to use simulated toe cap on left foot to assist with advancement. continued to use chairs around gym as targets for pt. Noted pt was not placing much if any weight through cane with gait, therefore transitioned pt to HHA/support on PTA shoulder, this allowed for increased control/assist with weight shifting in gait and facilitation for upright posture.                               Ambulation Distance (Feet) 45 Feet  x2, 50 x1,    Assistive device Large base quad cane;1 person hand held assist  HHA- pt right UE/hand on PTA shoulder   Gait Pattern Decreased stance time - left;Decreased step length - left;Decreased hip/knee flexion - left;Decreased dorsiflexion - left;Decreased weight shift to left;Narrow base of support;Trunk rotated posteriorly on left;Step-to pattern;Step-through pattern;Poor foot clearance - left   Ambulation Surface Level;Indoor              PT Short Term Goals - 11/10/15 1932    PT SHORT TERM  GOAL #1   Title The patient will perform HEP at MOD I level (with assist from family) in order to improve balance, strength, and flexibility. Target date: 10/08/15   Baseline Met 5/3.   Status Achieved   PT SHORT TERM GOAL #2   Title The patient will consistently perform sit<>stand with min A and 25% cueing to indicate increased independence during transfers.  Modified Target date: 10/08/15   Baseline Met 5/3.   Status Achieved   PT SHORT TERM GOAL #3   Title Assess bed mobility and write goal. Target date: 10/08/15   Baseline Met 3/8.   Status Achieved   PT SHORT TERM GOAL #4   Title Perform w/c to mat  transfer and write goal. Target date: 10/08/15   Baseline Met 3/8.   Status Achieved   PT SHORT TERM GOAL #5   Title The patient will ambulate 48' with RW with max A and +1 assist to improve functional mobility. Target date 10/08/15   Baseline Met 5/3 with hemi walker and mod A.   Status Achieved   PT SHORT TERM GOAL #6   Title Pt will consistently perform sit > supine with Min A, min cueing for technique to indicate increased independence getting into bed.  (Modified target date: 12/08/15)    Baseline 5/3: continues to require Mod A for BLE management  Continue as STG through renewed POC.   Status Not Met   PT SHORT TERM GOAL #7   Title Pt will demo ability to bridge to scoot in all directions (superior, inferior, R/L) with supervision, min cueing to indicate increased independence with bed mobility.   (Modified target date: 12/08/15)    Baseline Requires min A to stabilize LLE.  Continue as STG through renewed POC.   Status Partially Met   PT SHORT TERM GOAL #8   Title Pt will perform stand pivot transfer from w/c <>  mat with Mod A to decrease physical burden on caregivers.   Target date 10/08/15   Baseline Met 5/3.   Status Achieved           PT Long Term Goals - 11/10/15 1934    PT LONG TERM GOAL #1   Title Pt will amb. 75' with RW and mod A to improve functional mobility.  (Modified target date: 01/05/16)   Baseline 5/2: Gait x48' with hemi walker and mod A; however, inconsistent.  Continue goal through renewed POC   Status Not Met   PT LONG TERM GOAL #2   Title Pt will perform seated scooting in to L and R with supervision, min cueing to increase pt independence with functional mobility. (Modified target date: 01/05/16)   Baseline 5/3: Inconsistent performance of seated scooting with supervision; requires min A at times.  Continue goal through renewed POC   Status Partially Met   PT LONG TERM GOAL #3   Title The patient will perform sit <> stand with CGA and RW to improve  functional mobility. Target date: 11/05/15    Baseline Met 5/3.   Status Achieved   PT LONG TERM GOAL #4   Title Pt will perform stand pivot transfer from w/c <>  mat with Min A to decrease physical burden on caregivers.  (Modified target date: 01/05/16)   Baseline 5/3: Mod A using hem walker  Continue goal through renewed POC   Status Partially Met   PT LONG TERM GOAL #5   Title Pt will consistently perform supine > sit with supervision, min cueing  for technique to indicate increased independence getting out of bed.  Target date: 11/05/15    Baseline Met 5/3.   Status Achieved   PT LONG TERM GOAL #6   Title Pt will consistently perform sit > supine with supervision, min cueing for technique to indicate increased independence getting into bed. (Modified target date: 01/05/16)   Baseline 5/3: Continues to require mod A for BLE management.  Continue goal through renewed POC   Status Not Met           Plan - 12/24/15 1827    Clinical Impression Statement Continued to address gait and posture this session with pt needing less overall assistance vs with previous sessions. Pt is making slow, steady progress toward goals. Continues to need occasional assistance with left leg advancement and control  of step posiition with gait. Pt's daughter is to bring in her sneakers next session for trial of orthotic braces in clinic.                  Rehab Potential Good   Clinical Impairments Affecting Rehab Potential patient self pay for services   PT Frequency 2x / week   PT Duration 8 weeks   PT Treatment/Interventions Electrical Stimulation;Gait training;Stair training;Neuromuscular re-education;Balance training;Therapeutic exercise;Therapeutic activities;Functional mobility training;Patient/family education;Wheelchair mobility training;Manual techniques;Orthotic Fit/Training;ADLs/Self Care Home Management;Biofeedback;DME Instruction   PT Next Visit Plan Trial clinic orthotics to see if they assist with  left leg advanement/control with gait;Contact [employer] RE: L GRAFO (? self pay). work on scooting as pt needs max cues on lateral/anterior<>posterior scooting, gait wtih LBQC   Consulted and Agree with Plan of Care Patient;Family member/caregiver   Family Member Consulted daughter, [name]      Patient will benefit from skilled therapeutic intervention in order to improve the following deficits and impairments:  Abnormal gait, Decreased balance, Decreased mobility, Decreased strength, Postural dysfunction, Impaired flexibility, Pain, Decreased activity tolerance, Decreased endurance, Increased muscle spasms, Difficulty walking, Impaired tone  Visit Diagnosis: Other abnormalities of gait and mobility  Spastic hemiplegia affecting left nondominant side (HCC)  Abnormal posture  Unsteadiness on feet     Problem List Patient Active Problem List   Diagnosis Date Noted  . Nontraumatic subcortical hemorrhage of right cerebral hemisphere ([place]) 09/15/2015  . Seizures ([place]) 06/21/2015  . Hemorrhage, intracerebral ([place]) 02/03/2015  . Hemiparesis ([place]) 02/03/2015  . Spastic hemiplegia affecting nondominant side ([place]) 02/03/2015    Willow Ora, PTA, [facility] [address], [place], [place] [address] [phone] 12/25/2015, 6:33 PM   Name: Naiya Corral MRN: [id number] Date of Birth: 08-Oct-1940

## 2015-12-31 ENCOUNTER — Ambulatory Visit: Payer: Self-pay | Admitting: Physical Therapy

## 2015-12-31 ENCOUNTER — Encounter: Payer: Self-pay | Admitting: Physical Therapy

## 2015-12-31 ENCOUNTER — Ambulatory Visit: Payer: Self-pay | Admitting: Physical Medicine & Rehabilitation

## 2015-12-31 DIAGNOSIS — G8114 Spastic hemiplegia affecting left nondominant side: Secondary | ICD-10-CM

## 2015-12-31 DIAGNOSIS — R2681 Unsteadiness on feet: Secondary | ICD-10-CM

## 2015-12-31 DIAGNOSIS — R2689 Other abnormalities of gait and mobility: Secondary | ICD-10-CM

## 2015-12-31 DIAGNOSIS — R293 Abnormal posture: Secondary | ICD-10-CM

## 2016-01-03 ENCOUNTER — Encounter: Payer: Self-pay | Attending: Physical Medicine & Rehabilitation

## 2016-01-03 ENCOUNTER — Telehealth: Payer: Self-pay | Admitting: Neurology

## 2016-01-03 ENCOUNTER — Ambulatory Visit (HOSPITAL_BASED_OUTPATIENT_CLINIC_OR_DEPARTMENT_OTHER): Payer: Self-pay | Admitting: Physical Medicine & Rehabilitation

## 2016-01-03 ENCOUNTER — Other Ambulatory Visit: Payer: Self-pay

## 2016-01-03 ENCOUNTER — Encounter: Payer: Self-pay | Admitting: Physical Medicine & Rehabilitation

## 2016-01-03 VITALS — BP 149/92 | HR 79 | Resp 14

## 2016-01-03 DIAGNOSIS — Z7982 Long term (current) use of aspirin: Secondary | ICD-10-CM | POA: Insufficient documentation

## 2016-01-03 DIAGNOSIS — G8110 Spastic hemiplegia affecting unspecified side: Secondary | ICD-10-CM | POA: Insufficient documentation

## 2016-01-03 DIAGNOSIS — R569 Unspecified convulsions: Secondary | ICD-10-CM

## 2016-01-03 MED ORDER — LEVETIRACETAM 500 MG PO TABS: 500.0000 mg | ORAL_TABLET | Freq: Two times a day (BID) | ORAL | Status: DC

## 2016-01-03 NOTE — Patient Instructions (Signed)
Continue to bicycle 30 minutes

## 2016-01-03 NOTE — Telephone Encounter (Signed)
Pt called in and was told [facility] is at [facility].

## 2016-01-03 NOTE — Progress Notes (Signed)
Subjective:    Patient ID: Michelle Kemp, female    DOB: 06/23/1941, 75 y.o.   MRN: [id number]  HPI Patient returns today she is pleased with the results of the botulinum toxin injection performed on 11/04/2015  No abnormal bruising Her daughter is doing therapy with her  Pain Inventory Average Pain 7 Pain Right Now 7 My pain is stabbing and tingling  In the last 24 hours, has pain interfered with the following? General activity 0 Relation with others 0 Enjoyment of life 0 What TIME of day is your pain at its worst? daytime, night  Sleep (in general) Fair  Pain is worse with: some activites Pain improves with: heat/ice and injections Relief from Meds: 5  Mobility ability to climb steps?  no  Function disabled: date disabled .  Neuro/Psych No problems in this area  Prior Studies Any changes since last visit?  no  Physicians involved in your care Any changes since last visit?  no   Family History  Problem Relation Age of Onset  . Arthritis    . Stroke Neg Hx    Social History   Social History  . Marital Status: Widowed    Spouse Name: N/A  . Number of Children: 2  . Years of Education: N/A   Social History Main Topics  . Smoking status: Never Smoker   . Smokeless tobacco: None  . Alcohol Use: No  . Drug Use: No  . Sexual Activity: Not Asked   Other Topics Concern  . None   Social History Narrative   Lives with family   Caffeine use: none    Past Surgical History  Procedure Laterality Date  . Hernia repair  2001   Past Medical History  Diagnosis Date  . Stroke (HCC)   . Arthritis   . Seizures (HCC)    BP 149/92 mmHg  Pulse 79  Resp 14  SpO2 96%  Opioid Risk Score:   Fall Risk Score:  `1  Depression screen PHQ 2/9  [facility] 08/24/2015 04/12/2015  Decreased Interest 0 0  Down, Depressed, Hopeless 0 0  PHQ - 2 Score 0 0  Altered sleeping 2 -  Tired, decreased energy 3 -  Change in appetite 0 -  Feeling bad or  failure about yourself  1 -  Trouble concentrating 0 -  Moving slowly or fidgety/restless 3 -  Suicidal thoughts 1 -  PHQ-9 Score 10 -  Difficult doing work/chores Not difficult at all -     Review of Systems  Constitutional: Negative.   HENT: Negative.   Eyes: Negative.   Respiratory: Negative.   Cardiovascular: Negative.   Gastrointestinal: Negative.   Endocrine: Negative.   Genitourinary: Negative.   Musculoskeletal: Negative.   Skin: Negative.   Allergic/Immunologic: Negative.   Neurological: Negative.   Hematological: Negative.   Psychiatric/Behavioral: Negative.   All other systems reviewed and are negative.      Objective:   Physical Exam  Constitutional: She appears well-developed and well-nourished.  HENT:  Head: Normocephalic and atraumatic.  Eyes: Conjunctivae and EOM are normal. Pupils are equal, round, and reactive to light.  Neck: Normal range of motion.  Neurological: Coordination abnormal.  Manual muscle testing 2 minus Left bicep tricep deltoid, to my prescription, 0 finger extensors 3 minus knee extensor, trace ankle dorsiflexor  Decreased coordination on the left  Motor strength normal on the right side  Psychiatric: She has a normal mood and affect.  Nursing note and vitals  reviewed.  Tone [place] 2 at the finger flexors, 2 at the pronator, 1 at the wrist flexors, 2 at the knee extensors       Assessment & Plan:  1.  Improvement in Spasticity, left upper and left lower following botulinum toxin performed 11/04/2015. Plan repeat in 1 month with the following dosage which would be identical  FCR50  FDS50 FDP50 FPL25 Pronator25  Left Lower ext VMO 50 U Rectus fem 75U Vast Lat 75U  Discussed with patient and her daughter who is the Nurse, learning disabilitytranslator. Agree with plan

## 2016-01-03 NOTE — Telephone Encounter (Signed)
[facility]n [facility] and the keppra prescription was receive via e scribed.

## 2016-01-03 NOTE — Telephone Encounter (Signed)
RN call [facility] and talk to a rep. Rn stated per daughters request in May and phone note she wanted the keppra prescription sent to [facility] [address] because of the price. The pharmacist dept stated the daughter last refill the keppra medication on 12/02/2015. They call [facility] and they stated there were no refills. Rn stated per med list, the keppra prescription was sent and receive by [facility] on 11/25/2015/ The pharm staff at [facility] stated the medication is cheaper at the 10.00 rate. Rn stated the daughter needs to let [facility] where does she want future refills for her moms keppra so mom can get her refills. Rn [facility] with [facility] refills.

## 2016-01-03 NOTE — Telephone Encounter (Signed)
[name] stopped by the office requesting refill of levetiracetam 500 mg. She uses [facility] [address]. Daughter said patient had a seizure yesterday and does not have anymore medication. Best call back is [phone]

## 2016-01-03 NOTE — Therapy (Signed)
[facility] [address] [place], [place], [address] Phone: [phone]   Fax:  [phone]  Physical Therapy Treatment  Patient Details  Name: Michelle Kemp MRN: [id number] Date of Birth: 26-Jan-1941 Referring Provider: Dr. Erlinda Hong  Encounter Date: 12/31/2015   12/31/15 1455  PT Visits / Re-Eval  Visit Number 12 (number adjusted due to misssed on last visit)  Number of Visits 17  Date for PT Re-Evaluation 01/09/16  Authorization  Authorization Type Self pay  PT Time Calculation  PT Start Time 1448  PT Stop Time 1530  PT Time Calculation (min) 42 min  PT - End of Session  Equipment Utilized During Treatment Gait belt  Activity Tolerance Patient limited by fatigue;Patient tolerated treatment well (Seated rest breaks required.)  Behavior During Therapy [facility] for tasks assessed/performed     Past Medical History  Diagnosis Date  . Stroke ([place])   . Arthritis   . Seizures [facility])     Past Surgical History  Procedure Laterality Date  . Hernia repair  2001    There were no vitals filed for this visit.     12/31/15 1519  Transfers  Transfers Sit to Stand;Stand to Sit  Sit to Stand 4: Min guard;4: Min assist;With upper extremity assist;With armrests;From chair/3-in-1  Sit to Stand Details Verbal cues for technique;Verbal cues for sequencing;Tactile cues for weight shifting;Verbal cues for precautions/safety  Sit to Stand Details (indicate cue type and reason) pt performs better with gentle hand pressure to upper back that she counter pushes against to promote trunk shift to stand. use of gait belt to assist with standing causes pt to perform less of the transfer and increase assist needed.                       Stand to Sit 4: Min guard;4: Min assist;With upper extremity assist;To chair/3-in-1;With armrests;Uncontrolled descent  Stand to Sit Details (indicate cue type and reason) Verbal cues for sequencing;Verbal cues  for technique;Verbal cues for precautions/safety;Manual facilitation for weight shifting  Stand to Sit Details cues to turn all the way to surface before sitting and to reach back before sitting down. assist needed to control descent, amount needed increased as pt fatigues.  Ambulation/Gait  Ambulation/Gait Yes  Ambulation/Gait Assistance 4: Min assist;3: Mod assist  Ambulation/Gait Assistance Details increased assist needed with 1st rep, initially using cane, howvever changed to pt's hand on PTA shoulder to promote upright posture. assist/facilitaiton needed for weight shifting with gait. cues needed to increase bil step length and weight shifting. changed back to use of cane with last 2 reps of gait.  Ambulation Distance (Feet) 45 Feet (x 3 reps)  Assistive device Large base quad cane;1 person hand held assist (simulated toe cap on left side)  Gait Pattern Decreased stance time - left;Decreased step length - left;Decreased hip/knee flexion - left;Decreased dorsiflexion - left;Decreased weight shift to left;Narrow base of support;Trunk rotated posteriorly on left;Step-to pattern;Step-through pattern;Poor foot clearance - left  Ambulation Surface Level;Indoor         PT Short Term Goals - 11/10/15 1932    PT SHORT TERM GOAL #1   Title The patient will perform HEP at MOD I level (with assist from family) in order to improve balance, strength, and flexibility. Target date: 10/08/15   Baseline Met 5/3.   Status Achieved   PT SHORT TERM GOAL #2   Title The patient will consistently perform sit<>stand with min A and 25% cueing to  indicate increased independence during transfers.  Modified Target date: 10/08/15   Baseline Met 5/3.   Status Achieved   PT SHORT TERM GOAL #3   Title Assess bed mobility and write goal. Target date: 10/08/15   Baseline Met 3/8.   Status Achieved   PT SHORT TERM GOAL #4   Title Perform w/c to mat transfer and write goal. Target date: 10/08/15   Baseline Met 3/8.    Status Achieved   PT SHORT TERM GOAL #5   Title The patient will ambulate 61' with RW with max A and +1 assist to improve functional mobility. Target date 10/08/15   Baseline Met 5/3 with hemi walker and mod A.   Status Achieved   PT SHORT TERM GOAL #6   Title Pt will consistently perform sit > supine with Min A, min cueing for technique to indicate increased independence getting into bed.  (Modified target date: 12/08/15)    Baseline 5/3: continues to require Mod A for BLE management  Continue as STG through renewed POC.   Status Not Met   PT SHORT TERM GOAL #7   Title Pt will demo ability to bridge to scoot in all directions (superior, inferior, R/L) with supervision, min cueing to indicate increased independence with bed mobility.   (Modified target date: 12/08/15)    Baseline Requires min A to stabilize LLE.  Continue as STG through renewed POC.   Status Partially Met   PT SHORT TERM GOAL #8   Title Pt will perform stand pivot transfer from w/c <>  mat with Mod A to decrease physical burden on caregivers.   Target date 10/08/15   Baseline Met 5/3.   Status Achieved           PT Long Term Goals - 11/10/15 1934    PT LONG TERM GOAL #1   Title Pt will amb. 29' with RW and mod A to improve functional mobility.  (Modified target date: 01/05/16)   Baseline 5/2: Gait x48' with hemi walker and mod A; however, inconsistent.  Continue goal through renewed POC   Status Not Met   PT LONG TERM GOAL #2   Title Pt will perform seated scooting in to L and R with supervision, min cueing to increase pt independence with functional mobility. (Modified target date: 01/05/16)   Baseline 5/3: Inconsistent performance of seated scooting with supervision; requires min A at times.  Continue goal through renewed POC   Status Partially Met   PT LONG TERM GOAL #3   Title The patient will perform sit <> stand with CGA and RW to improve functional mobility. Target date: 11/05/15    Baseline Met 5/3.   Status  Achieved   PT LONG TERM GOAL #4   Title Pt will perform stand pivot transfer from w/c <>  mat with Min A to decrease physical burden on caregivers.  (Modified target date: 01/05/16)   Baseline 5/3: Mod A using hem walker  Continue goal through renewed POC   Status Partially Met   PT LONG TERM GOAL #5   Title Pt will consistently perform supine > sit with supervision, min cueing for technique to indicate increased independence getting out of bed.  Target date: 11/05/15    Baseline Met 5/3.   Status Achieved   PT LONG TERM GOAL #6   Title Pt will consistently perform sit > supine with supervision, min cueing for technique to indicate increased independence getting into bed. (Modified target date: 01/05/16)   Baseline  5/3: Continues to require mod A for BLE management.  Continue goal through renewed POC   Status Not Met        12/31/15 1630  Plan  Clinical Impression Statement Today's session continued to address gait working on posture, pelvic position with gait and correction of gait deviations. Pt is quick to fatigue, needing rest breaks between gait sets. Progressing towards goals.  Pt will benefit from skilled therapeutic intervention in order to improve on the following deficits Abnormal gait;Decreased balance;Decreased mobility;Decreased strength;Postural dysfunction;Impaired flexibility;Pain;Decreased activity tolerance;Decreased endurance;Increased muscle spasms;Difficulty walking;Impaired tone  Rehab Potential Good  Clinical Impairments Affecting Rehab Potential patient self pay for services  PT Frequency 2x / week  PT Duration 8 weeks  PT Treatment/Interventions Electrical Stimulation;Gait training;Stair training;Neuromuscular re-education;Balance training;Therapeutic exercise;Therapeutic activities;Functional mobility training;Patient/family education;Wheelchair mobility training;Manual techniques;Orthotic Fit/Training;ADLs/Self Care Home Management;Biofeedback;DME Instruction  PT  Next Visit Plan Trial clinic orthotics to see if they assist with left leg advanement/control with gait;Contact [employer]: L GRAFO (? self pay). work on scooting as pt needs max cues on lateral/anterior<>posterior scooting, gait wtih LBQC  Consulted and Agree with Plan of Care Patient;Family member/caregiver  Family Member Consulted daughter, [name]     Patient will benefit from skilled therapeutic intervention in order to improve the following deficits and impairments:     Visit Diagnosis: Other abnormalities of gait and mobility  Spastic hemiplegia affecting left nondominant side (HCC)  Abnormal posture  Unsteadiness on feet     Problem List Patient Active Problem List   Diagnosis Date Noted  . 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 Seizures ([place]) 06/21/2015  . Hemorrhage, intracerebral ([place]) 02/03/2015  . Hemiparesis ([place]) 02/03/2015  . Spastic hemiplegia affecting nondominant side ([place]) 02/03/2015    Willow Ora, PTA, [facility] [address] [place], [place] [address] [phone] 01/03/2016, 1:05 PM   Name: Michelle Kemp MRN: [id number] Date of Birth: 04-04-1941

## 2016-01-03 NOTE — Telephone Encounter (Signed)
If patients daughter call back, per note in May 2017 she requested her moms keppra be sent to [facility] [address] see office note.  The patients keppra  prescription is at [facility] [address] and has two refills left.Pt has refill till September 2017. Rn left vm on daughters phone to call back..Marland Kitchen

## 2016-01-07 ENCOUNTER — Ambulatory Visit: Payer: Self-pay | Admitting: Physical Therapy

## 2016-01-07 ENCOUNTER — Encounter: Payer: Self-pay | Admitting: Physical Therapy

## 2016-01-07 DIAGNOSIS — R2681 Unsteadiness on feet: Secondary | ICD-10-CM

## 2016-01-07 DIAGNOSIS — R293 Abnormal posture: Secondary | ICD-10-CM

## 2016-01-07 DIAGNOSIS — R2689 Other abnormalities of gait and mobility: Secondary | ICD-10-CM

## 2016-01-07 DIAGNOSIS — G8114 Spastic hemiplegia affecting left nondominant side: Secondary | ICD-10-CM

## 2016-01-07 NOTE — Therapy (Signed)
Michelle Kemp [address] [place], [place], [address] Phone: [phone]   Fax:  [phone]  Physical Therapy Treatment  Patient Details  Name: Michelle Kemp MRN: [id number] Date of Birth: 27-Feb-1941 Referring Provider: Dr. Erlinda Kemp  Encounter Date: 01/07/2016      PT End of Session - 01/07/16 1454    Visit Number 13   Number of Visits 17   Date for PT Re-Evaluation 01/09/16   Authorization Type Self pay   PT Start Time 1446   PT Stop Time 1530   PT Time Calculation (min) 44 min   Equipment Utilized During Treatment Gait belt   Activity Tolerance Patient limited by fatigue;Patient tolerated treatment well  Seated rest breaks required.   Behavior During Therapy [facility] for tasks assessed/performed      Past Medical History  Diagnosis Date  . Stroke ([place])   . Arthritis   . Seizures [facility])     Past Surgical History  Procedure Laterality Date  . Hernia repair  2001    There were no vitals filed for this visit.      Subjective Assessment - 01/07/16 1450    Subjective Daughter reports Dr. Joan Kemp is planning to do more injections to left arm (?bicep) and left leg (daughter unsure where, however states it suppossed to help her control the leg more with walking). No falls. Reports they forgot her bigger shoes today (for trying orthotics today).    Patient is accompained by: Family member  daughter, Michelle Kemp   Pertinent History Seizures, pt's dtr reports pt is allergic to Delsym and OTC motrin   Patient Stated Goals To walk again    Currently in Pain? Yes   Pain Score 5    Pain Location Hip  and left shoulder   Pain Orientation Left   Pain Descriptors / Indicators Aching;Sore   Pain Type Chronic pain   Pain Onset More than a month ago   Aggravating Factors  increased activity   Pain Relieving Factors sitting, restsing            [facility] Adult PT Treatment/Exercise - 01/07/16 1456    Transfers   Transfers Sit  to Stand;Stand to Sit   Sit to Stand 4: Min guard;4: Min assist;With upper extremity assist;With armrests;From chair/3-in-1   Sit to Stand Details Verbal cues for technique;Verbal cues for sequencing;Tactile cues for weight shifting;Verbal cues for precautions/safety   Sit to Stand Details (indicate cue type and reason) pt performs more of the stand with gentle hand pressure to upper back/trunk vs use of gait belt. use of gait belt causes pt to push back and need more assistance                                                     Stand to Sit 4: Min assist;With upper extremity assist;To chair/3-in-1;With armrests;Uncontrolled descent;3: Mod assist   Stand to Sit Details (indicate cue type and reason) Verbal cues for sequencing;Verbal cues for technique;Verbal cues for precautions/safety;Manual facilitation for weight shifting   Stand to Sit Details cues needed for pt to turn and fully align herself with chair/mat surface before sitting down and to reach back to assist with sitting down.  Stand Pivot Transfers 4: Min assist   Stand Pivot Transfer Details (indicate cue type and reason) wheelchair to mat table with cues on posture and sequencing.    Lateral/Scoot Transfers 4: Min Art gallery manager Details (indicate cue type and reason) along edge of mat with max cues on sequencing and technique, verbal/visual/tactile cues needed    Comments left LE passive stretching of hip adductors, hamstrings and heel cords performed prior to gait.                                     Ambulation/Gait   Ambulation/Gait Yes   Ambulation/Gait Assistance 4: Min assist   Ambulation/Gait Assistance Details 2 person min assist with gait with cues/facilitation for upright posture, decreased trunk rotation, weight shifting and assist to advance left leg with gait.                                           Ambulation Distance (Feet) 45 Feet  x 3 reps   Assistive device None;1 person  hand held assist  2 person assist for safety and facilitation   Gait Pattern Decreased stance time - left;Decreased step length - left;Decreased hip/knee flexion - left;Decreased dorsiflexion - left;Decreased weight shift to left;Narrow base of support;Trunk rotated posteriorly on left;Step-to pattern;Step-through pattern;Poor foot clearance - left   Ambulation Surface Level;Indoor            PT Short Term Goals - 11/10/15 1932    PT SHORT TERM GOAL #1   Title The patient will perform HEP at MOD I level (with assist from family) in order to improve balance, strength, and flexibility. Target date: 10/08/15   Baseline Met 5/3.   Status Achieved   PT SHORT TERM GOAL #2   Title The patient will consistently perform sit<>stand with min A and 25% cueing to indicate increased independence during transfers.  Modified Target date: 10/08/15   Baseline Met 5/3.   Status Achieved   PT SHORT TERM GOAL #3   Title Assess bed mobility and write goal. Target date: 10/08/15   Baseline Met 3/8.   Status Achieved   PT SHORT TERM GOAL #4   Title Perform w/c to mat transfer and write goal. Target date: 10/08/15   Baseline Met 3/8.   Status Achieved   PT SHORT TERM GOAL #5   Title The patient will ambulate 57' with RW with max A and +1 assist to improve functional mobility. Target date 10/08/15   Baseline Met 5/3 with hemi walker and mod A.   Status Achieved   PT SHORT TERM GOAL #6   Title Pt will consistently perform sit > supine with Min A, min cueing for technique to indicate increased independence getting into bed.  (Modified target date: 12/08/15)    Baseline 5/3: continues to require Mod A for BLE management  Continue as STG through renewed POC.   Status Not Met   PT SHORT TERM GOAL #7   Title Pt will demo ability to bridge to scoot in all directions (superior, inferior, R/L) with supervision, min cueing to indicate increased independence with bed mobility.   (Modified target date: 12/08/15)     Baseline Requires min A to stabilize LLE.  Continue as STG through renewed POC.   Status Partially Met  PT SHORT TERM GOAL #8   Title Pt will perform stand pivot transfer from w/c <>  mat with Mod A to decrease physical burden on caregivers.   Target date 10/08/15   Baseline Met 5/3.   Status Achieved           PT Long Term Goals - 11/10/15 1934    PT LONG TERM GOAL #1   Title Pt will amb. 37' with RW and mod A to improve functional mobility.  (Modified target date: 01/05/16)   Baseline 5/2: Gait x48' with hemi walker and mod A; however, inconsistent.  Continue goal through renewed POC   Status Not Met   PT LONG TERM GOAL #2   Title Pt will perform seated scooting in to L and R with supervision, min cueing to increase pt independence with functional mobility. (Modified target date: 01/05/16)   Baseline 5/3: Inconsistent performance of seated scooting with supervision; requires min A at times.  Continue goal through renewed POC   Status Partially Met   PT LONG TERM GOAL #3   Title The patient will perform sit <> stand with CGA and RW to improve functional mobility. Target date: 11/05/15    Baseline Met 5/3.   Status Achieved   PT LONG TERM GOAL #4   Title Pt will perform stand pivot transfer from w/c <>  mat with Min A to decrease physical burden on caregivers.  (Modified target date: 01/05/16)   Baseline 5/3: Mod A using hem walker  Continue goal through renewed POC   Status Partially Met   PT LONG TERM GOAL #5   Title Pt will consistently perform supine > sit with supervision, min cueing for technique to indicate increased independence getting out of bed.  Target date: 11/05/15    Baseline Met 5/3.   Status Achieved   PT LONG TERM GOAL #6   Title Pt will consistently perform sit > supine with supervision, min cueing for technique to indicate increased independence getting into bed. (Modified target date: 01/05/16)   Baseline 5/3: Continues to require mod A for BLE management.   Continue goal through renewed POC   Status Not Met            Plan - 01/07/16 1455    Clinical Impression Statement Continued to address gait today working on postural correction and pt self advancing left leg with gait. Pt with increased left leg tone today, passive range of motion performed with stretching prior to gait with some improvment noted. Pt is making slow, stready progress toward goals.                      Rehab Potential Good   Clinical Impairments Affecting Rehab Potential patient self pay for services   PT Frequency 2x / week   PT Duration 8 weeks   PT Treatment/Interventions Electrical Stimulation;Gait training;Stair training;Neuromuscular re-education;Balance training;Therapeutic exercise;Therapeutic activities;Functional mobility training;Patient/family education;Wheelchair mobility training;Manual techniques;Orthotic Fit/Training;ADLs/Self Care Home Management;Biofeedback;DME Instruction   PT Next Visit Plan assess goals next visit due to end of plan of care   Consulted and Agree with Plan of Care Patient;Family member/caregiver   Family Member Consulted daughter, Michelle Kemp      Patient will benefit from skilled therapeutic intervention in order to improve the following deficits and impairments:  Abnormal gait, Decreased balance, Decreased mobility, Decreased strength, Postural dysfunction, Impaired flexibility, Pain, Decreased activity tolerance, Decreased endurance, Increased muscle spasms, Difficulty walking, Impaired tone  Visit Diagnosis: Other abnormalities of gait and  mobility  Spastic hemiplegia affecting left nondominant side (HCC)  Abnormal posture  Unsteadiness on feet     Problem List Patient Active Problem List   Diagnosis Date Noted  . 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 Seizures ([place]) 06/21/2015  . Hemorrhage, intracerebral ([place]) 02/03/2015  . Hemiparesis ([place]) 02/03/2015  . Spastic hemiplegia  affecting nondominant side ([place]) 02/03/2015    Willow Ora, PTA, [facility] [address] [place], [place] [address] [phone] 01/07/2016, 7:35 PM   Name: Laniya Friedl MRN: [id number] Date of Birth: January 04, 1941

## 2016-01-14 ENCOUNTER — Ambulatory Visit: Payer: Self-pay | Admitting: Physical Therapy

## 2016-01-21 ENCOUNTER — Ambulatory Visit: Payer: Self-pay | Admitting: Physical Therapy

## 2016-01-28 ENCOUNTER — Ambulatory Visit: Payer: Self-pay | Admitting: Physical Therapy

## 2016-01-31 ENCOUNTER — Ambulatory Visit (HOSPITAL_BASED_OUTPATIENT_CLINIC_OR_DEPARTMENT_OTHER): Payer: Self-pay | Admitting: Physical Medicine & Rehabilitation

## 2016-01-31 ENCOUNTER — Encounter: Payer: Self-pay | Attending: Physical Medicine & Rehabilitation

## 2016-01-31 ENCOUNTER — Encounter: Payer: Self-pay | Admitting: Physical Medicine & Rehabilitation

## 2016-01-31 VITALS — BP 131/97 | HR 92 | Temp 98.4°F

## 2016-01-31 DIAGNOSIS — Z7982 Long term (current) use of aspirin: Secondary | ICD-10-CM | POA: Insufficient documentation

## 2016-01-31 DIAGNOSIS — G8110 Spastic hemiplegia affecting unspecified side: Secondary | ICD-10-CM

## 2016-01-31 NOTE — Patient Instructions (Signed)

## 2016-01-31 NOTE — Progress Notes (Signed)
Botox Injection for spasticity using needle EMG guidance  Dilution: 50 Units/ml Indication: Severe spasticity which interferes with ADL,mobility and/or  hygiene and is unresponsive to medication management and other conservative care Informed consent was obtained after describing risks and benefits of the procedure with the patient. This includes bleeding, bruising, infection, excessive weakness, or medication side effects. A REMS form is on file and signed. Needle: 17mm, 25g needle electrode Number of units per muscle FCR50  FDS50 FDP50 FPL25 Pronator25  Left Lower ext VMO 50 U Rectus fem 75U Vast Lat 75U All injections were done after obtaining appropriate EMG activity and after negative drawback for blood. The patient tolerated the procedure well. Post procedure instructions were given. A followup appointment was made.

## 2016-02-04 ENCOUNTER — Ambulatory Visit: Payer: Self-pay | Attending: Neurology | Admitting: Physical Therapy

## 2016-02-04 DIAGNOSIS — G8114 Spastic hemiplegia affecting left nondominant side: Secondary | ICD-10-CM | POA: Insufficient documentation

## 2016-02-04 DIAGNOSIS — R2689 Other abnormalities of gait and mobility: Secondary | ICD-10-CM | POA: Insufficient documentation

## 2016-02-04 NOTE — Therapy (Signed)
[facility] [address] [place], [place], [address] Phone: [phone]   Fax:  [phone]  Physical Therapy Treatment  Patient Details  Name: Michelle Kemp MRN: [id number] Date of Birth: Feb 20, 1941 Referring Provider: Dr. Erlinda Hong  Encounter Date: 02/04/2016      PT End of Session - 02/04/16 1952    Visit Number 14   Number of Visits 18   Date for PT Re-Evaluation 03/10/16   Authorization Type Self pay   PT Start Time 1448   PT Stop Time 1540   PT Time Calculation (min) 52 min   Equipment Utilized During Treatment Gait belt   Activity Tolerance Patient limited by fatigue   Behavior During Therapy [facility] for tasks assessed/performed      Past Medical History:  Diagnosis Date  . Arthritis   . Seizures ([place])   . Stroke [facility])     Past Surgical History:  Procedure Laterality Date  . HERNIA REPAIR  2001    There were no vitals filed for this visit.      Subjective Assessment - 02/04/16 1452    Subjective Pt had botox in LUE and LLE (quadriceps) on Monday 7/24. Per daughter, "Dr. Letta Pate recommended that we continue therapy rather than stopping for 3 weeks." Pt had seizure last Tuesday. [name] states, "She (patient) always starts as severe pain in the left shoulder, followed by severe pain in the left hip, followed by a little pain in the chest, then she has a seizure for about 5 seconds; and the pain starts."   Patient is accompained by: Family member  daughter, [name]   Pertinent History Seizures; pt's dtr reports pt is allergic to Delsym and OTC motrin   Patient Stated Goals To walk again    Currently in Pain? Yes   Pain Score 5    Pain Location Leg   Pain Orientation Upper;Mid;Left   Pain Descriptors / Indicators Other (Comment)  "like a charlie horse"   Pain Type Acute pain   Pain Onset In the past 7 days   Pain Frequency Constant   Aggravating Factors  unsure   Pain Relieving Factors unsure   Multiple  Pain Sites No                         [facility] Adult PT Treatment/Exercise - 02/04/16 0001      Transfers   Transfers Sit to Stand;Stand to Sit   Sit to Stand 4: Min assist   Sit to Stand Details Manual facilitation for weight shifting   Stand to Sit 4: Min assist     Ambulation/Gait   Ambulation/Gait Yes   Ambulation/Gait Assistance 3: Mod assist;4: Min assist   Ambulation/Gait Assistance Details Gait x35' without AD using L carbon fiber GRAFO (Reaction), PT positioned anterior to patient with RUE on therapist's shoulder, providing min A initially,  mod A as pt fatigued. Subsequent gait trial x40' with PT providing assist as aforementioned with pt using L PLS AFO (Reaction); pt reported increased comfort and did demonstrate increased independence with initiation of LLE advancement (suspect this is due to [facility] promoting L knee flexion during L swing). Gait x15' with LBQC and mod A, no AFO, with significant L forefoot pronation, L hip external rotation, posterior rotation of turnk on L side. Final gait trial x40' with B PFRW (in attempt to increase pt independence with gait) and no AFO requiring mod A. B PFRW not effective due to pt difficulty  using L trunk rotation/L hip adduction to compensate for L hip/knee flexion. Tactile cueing focused on lateral weight shift to R; verbal cueing/manual facilitation addressed posterior rotation of trunk on L; manual repositioning of LLE placement.  modified toe cap on L shoe for all gait trials   Ambulation Distance (Feet) 130 Feet  total   Assistive device None;Large base quad cane;Bilateral platform walker   Gait Pattern Decreased stance time - left;Decreased step length - left;Decreased hip/knee flexion - left;Decreased dorsiflexion - left;Decreased weight shift to left;Trunk rotated posteriorly on left;Step-to pattern;Step-through pattern;Poor foot clearance - left;Decreased weight shift to right;Abducted - left  L hip ER, L forefoot  pronation   Ambulation Surface Level;Indoor                PT Education - 02/04/16 1955    Education provided Yes   Education Details Discussed modified POC to 1x/week for 4 additional weeks. Per daughter questions about bracing, recommended daughter directly contact orthotist and set up an in-office consult to get orthotist's recommendations. Discussed consideration of AFO fitting on outside of shoe (such as TurboMed FS3000) due ot fluctuating LLE edema and difficulty fitting carbon fiber AFO's in pt's shoes.    Person(s) Educated Patient;Child(ren)  daughter, [name]   Methods Explanation   Comprehension Verbalized understanding          PT Short Term Goals - 02/04/16 1959      PT SHORT TERM GOAL #1   Title The patient will perform HEP at MOD I level (with assist from family) in order to improve balance, strength, and flexibility. Target date: 10/08/15   Baseline Met 5/3.   Status Achieved     PT SHORT TERM GOAL #2   Title The patient will consistently perform sit<>stand with min A and 25% cueing to indicate increased independence during transfers.  Modified Target date: 10/08/15   Baseline Met 5/3.   Status Achieved     PT SHORT TERM GOAL #3   Title Assess bed mobility and write goal. Target date: 10/08/15   Baseline Met 3/8.   Status Achieved     PT SHORT TERM GOAL #4   Title Perform w/c to mat transfer and write goal. Target date: 10/08/15   Baseline Met 3/8.   Status Achieved     PT SHORT TERM GOAL #5   Title The patient will ambulate 67' with RW with max A and +1 assist to improve functional mobility. Target date 10/08/15   Baseline Met 5/3 with hemi walker and mod A.   Status Achieved     PT SHORT TERM GOAL #6   Title Pt will consistently perform sit > supine with Min A, min cueing for technique to indicate increased independence getting into bed.  (Modified target date: 12/08/15)    Baseline 5/3: continues to require Mod A for BLE management  Continue as STG  through renewed POC.   Status Not Met     PT SHORT TERM GOAL #7   Title Pt will demo ability to bridge to scoot in all directions (superior, inferior, R/L) with supervision, min cueing to indicate increased independence with bed mobility.   (Modified target date: 12/08/15)    Baseline Requires min A to stabilize LLE.  Continue as STG through renewed POC.   Status Partially Met     PT SHORT TERM GOAL #8   Title Pt will perform stand pivot transfer from w/c <>  mat with Mod A to decrease physical burden on caregivers.  Target date 10/08/15   Baseline Met 5/3.   Status Achieved           PT Long Term Goals - 02/04/16 1959      PT LONG TERM GOAL #1   Title Pt will amb. 62' with RW and mod A to improve functional mobility.  (Modified target date: 01/05/16)   Baseline 5/2: Gait x48' with hemi walker and mod A; however, inconsistent.  Continue goal through renewed POC   Status Not Met     PT LONG TERM GOAL #2   Title Pt will perform seated scooting in to L and R with supervision, min cueing to increase pt independence with functional mobility. (Modified target date: 01/05/16)   Baseline 5/3: Inconsistent performance of seated scooting with supervision; requires min A at times.  Continue goal through renewed POC   Status Partially Met     PT LONG TERM GOAL #3   Title The patient will perform sit <> stand with CGA and RW to improve functional mobility. Target date: 11/05/15    Baseline Met 5/3.   Status Achieved     PT LONG TERM GOAL #4   Title Pt will perform stand pivot transfer from w/c <>  mat with Min A to decrease physical burden on caregivers.  (Modified target date: 01/05/16)   Baseline 5/3: Mod A using hem walker  Continue goal through renewed POC   Status Partially Met     PT LONG TERM GOAL #5   Title Pt will consistently perform supine > sit with supervision, min cueing for technique to indicate increased independence getting out of bed.  Target date: 11/05/15    Baseline  Met 5/3.   Status Achieved     PT LONG TERM GOAL #6   Title Pt will consistently perform sit > supine with supervision, min cueing for technique to indicate increased independence getting into bed. (Modified target date: 01/05/16)   Baseline 5/3: Continues to require mod A for BLE management.  Continue goal through renewed POC   Status Not Met      Updated goals:     PT Short Term Goals - 02/04/16 2022      PT SHORT TERM GOAL #1   Title STG's = LTG's          PT Long Term Goals - 02/04/16 2024      PT LONG TERM GOAL #1   Title Will trial body weight support system to decrease pain, increase pt tolerance to standing and ambulation.  (Target date: 03/08/16)   Status New     PT LONG TERM GOAL #2   Title Daughter will report pt ambulating in home >/= 25% of the time to indicate progress toward pt becoming household ambulator.  (03/08/16)   Status New     PT LONG TERM GOAL #3   Title Daughter will verbalize understanding of available resources to continue progressing functional mobility after DC from outpatient PT.  (03/08/16)   Status New              Plan - 02/04/16 2011    Clinical Impression Statement Pt met 2 and partially met 2/6 LTG's. Patient did recently receive botox injections in L VMO, rectus femoris, and vastus lateralis on 7/24. Pt's daughter verbalized understanding of home stretching program (provided during previous sessions) for home stretching program. This PT recommended that LLE stretching program be performed daily. Daughter verbalized understanding. Progress during previous 8 weeks limited by decreased attendance secondary to  pain and seizures. The patient will benefit from skilled outpatient PT for 4 additional sessions over a 5-week period to trial BWS system, continue gait training, and educate family on available resources. Pt/daughter in full agreement.     Rehab Potential Fair   PT Frequency --  4 sessions   PT Duration --  5 weeks   PT  Treatment/Interventions Gait training;Stair training;Neuromuscular re-education;Balance training;Therapeutic exercise;Therapeutic activities;Functional mobility training;Patient/family education;Wheelchair mobility training;Manual techniques;Orthotic Fit/Training;ADLs/Self Care Home Management;DME Instruction   PT Next Visit Plan Trial BWS system for gait   Consulted and Agree with Plan of Care Patient;Family member/caregiver   Family Member Consulted daughter, [name]      Patient will benefit from skilled therapeutic intervention in order to improve the following deficits and impairments:  Abnormal gait, Decreased balance, Decreased mobility, Decreased strength, Postural dysfunction, Impaired flexibility, Pain, Decreased activity tolerance, Decreased endurance, Increased muscle spasms, Difficulty walking, Impaired tone, Decreased coordination  Visit Diagnosis: Other abnormalities of gait and mobility  Spastic hemiplegia affecting left nondominant side [facility])     Problem List Patient Active Problem List   Diagnosis Date Noted  . Nontraumatic subcortical hemorrhage of right cerebral hemisphere ([place]) 09/15/2015  . Seizures ([place]) 06/21/2015  . Hemorrhage, intracerebral ([place]) 02/03/2015  . Hemiparesis ([place]) 02/03/2015  . Spastic hemiplegia affecting nondominant side ([place]) 02/03/2015    Billie Ruddy, PT, DPT [facility] [address] [place], [place], [address] Phone: [phone]   Fax:  [phone] 02/04/16, 8:22 PM  Name: Isabella Roemmich MRN: [id number] Date of Birth: 09/30/1940

## 2016-02-14 ENCOUNTER — Ambulatory Visit: Payer: Self-pay | Admitting: Physical Therapy

## 2016-02-16 ENCOUNTER — Ambulatory Visit: Payer: Self-pay | Admitting: Physical Therapy

## 2016-02-18 ENCOUNTER — Encounter: Payer: Self-pay | Admitting: Physical Therapy

## 2016-02-18 ENCOUNTER — Ambulatory Visit: Payer: Self-pay | Attending: Neurology | Admitting: Physical Therapy

## 2016-02-18 DIAGNOSIS — G8114 Spastic hemiplegia affecting left nondominant side: Secondary | ICD-10-CM | POA: Insufficient documentation

## 2016-02-18 DIAGNOSIS — R2681 Unsteadiness on feet: Secondary | ICD-10-CM | POA: Insufficient documentation

## 2016-02-18 DIAGNOSIS — R2689 Other abnormalities of gait and mobility: Secondary | ICD-10-CM | POA: Insufficient documentation

## 2016-02-18 DIAGNOSIS — R293 Abnormal posture: Secondary | ICD-10-CM | POA: Insufficient documentation

## 2016-02-21 ENCOUNTER — Ambulatory Visit: Payer: Self-pay | Admitting: Physical Therapy

## 2016-02-21 NOTE — Therapy (Signed)
[facility] [address] [place], [place], [address] Phone: [phone]   Fax:  [phone]  Physical Therapy Treatment  Patient Details  Name: Michelle Kemp Bring MRN: [id number] Date of Birth: 01/21/1941 Referring Provider: Dr. Roda ShuttersXu  Encounter Date: 02/18/2016   02/18/16 1456  PT Visits / Re-Eval  Visit Number 15  Number of Visits 18  Date for PT Re-Evaluation 03/10/16  Authorization  Authorization Type Self pay  PT Time Calculation  PT Start Time 1448  PT Stop Time 1530  PT Time Calculation (min) 42 min  PT - End of Session  Equipment Utilized During Treatment Gait belt  Activity Tolerance Patient limited by fatigue  Behavior During Therapy [facility] for tasks assessed/performed     Past Medical History:  Diagnosis Date  . Arthritis   . Seizures ([facility])   . Stroke [facility](H[facility])     Past Surgical History:  Procedure Laterality Date  . HERNIA REPAIR  2001    There were no vitals filed for this visit.   02/18/16 1455  Symptoms/Limitations  Subjective No new complaints. No falls. Pt does continue to have usual pain in shoulder, knee and foot.  Patient is accompained by: Family member  Pertinent History Seizures; pt's dtr reports pt is allergic to Delsym and OTC motrin  Pain Assessment  Currently in Pain? Yes  Pain Score 5  Pain Location Generalized (left foot and left shoulder)  Pain Descriptors / Indicators Aching;Sore  Pain Type Acute pain;Chronic pain  Pain Onset More than a month ago  Pain Frequency Constant     02/18/16 0145  Transfers  Transfers Sit to Stand;Stand to Sit  Sit to Stand With upper extremity assist;From chair/3-in-1;4: Min assist  Sit to Stand Details Manual facilitation for weight shifting  Sit to Stand Details (indicate cue type and reason) cues/faciltiation for weight shifitng to achieve standing  Stand to Sit 4: Min assist;With upper extremity assist;To chair/3-in-1;Uncontrolled descent   Stand to Sit Details (indicate cue type and reason) Verbal cues for sequencing;Verbal cues for technique;Verbal cues for precautions/safety;Manual facilitation for weight shifting  Stand to Sit Details cues to align self fully with seat before sitting, for use of arm to control desecent.  Ambulation/Gait  Ambulation/Gait Yes  Ambulation/Gait Assistance 4: Min assist;3: Mod assist  Ambulation/Gait Assistance Details trialed the body weight support system with gait today. no AD on 1st rep with mod assist needed for balance, weight shifting and left foot step placement. used pt's SBQC with second gait rep with min assist needed and cues/faciltitaion as stated already.  Ambulation Distance (Feet) 10 Feet (x1, 30 x1)  Assistive device Body weight support system;Small based quad cane  Gait Pattern Decreased stance time - left;Decreased step length - left;Decreased hip/knee flexion - left;Decreased dorsiflexion - left;Decreased weight shift to left;Trunk rotated posteriorly on left;Step-to pattern;Step-through pattern;Poor foot clearance - left;Decreased weight shift to right;Abducted - left  Ambulation Surface Level;Indoor          PT Short Term Goals - 02/04/16 2022      PT SHORT TERM GOAL #1   Title STG's = LTG's           PT Long Term Goals - 02/04/16 2024      PT LONG TERM GOAL #1   Title Will trial body weight support system to decrease pain, increasse pt tolerance to standing and ambulation.  (Target date: 03/08/16)   Status New     PT LONG TERM GOAL #2   Title  Daughter will report pt ambulating in home >/= 25% of the time to indicate progress toward pt becoming household ambulator.  (03/08/16)   Status New     PT LONG TERM GOAL #3   Title Daughter will verbalize understanding of available resources to continue progressing functional mobility after DC from outpatient PT.  (03/08/16)   Status New        02/18/16 1457  Plan  Clinical Impression Statement In today's session  we trialed the body weight support system for gait with minimal improvement noted with gait quality/blance. Pt continues to have upper body/hip rotation compenstating for weakness of left side. Pt is making slow progress toward goals.             Pt will benefit from skilled therapeutic intervention in order to improve on the following deficits Abnormal gait;Decreased balance;Decreased mobility;Decreased strength;Postural dysfunction;Impaired flexibility;Pain;Decreased activity tolerance;Decreased endurance;Increased muscle spasms;Difficulty walking;Impaired tone;Decreased coordination  Rehab Potential Fair  PT Frequency (4 sessions)  PT Duration (5 weeks)  PT Treatment/Interventions Gait training;Stair training;Neuromuscular re-education;Balance training;Therapeutic exercise;Therapeutic activities;Functional mobility training;Patient/family education;Wheelchair mobility training;Manual techniques;Orthotic Fit/Training;ADLs/Self Care Home Management;DME Instruction  PT Next Visit Plan continue to work on gait toward LTGs.  Consulted and Agree with Plan of Care Patient;Family member/caregiver  Family Member Consulted daughter, Raynelle FanningJulie          Patient will benefit from skilled therapeutic intervention in order to improve the following deficits and impairments:  Abnormal gait, Decreased balance, Decreased mobility, Decreased strength, Postural dysfunction, Impaired flexibility, Pain, Decreased activity tolerance, Decreased endurance, Increased muscle spasms, Difficulty walking, Impaired tone, Decreased coordination  Visit Diagnosis: Other abnormalities of gait and mobility  Spastic hemiplegia affecting left nondominant side (HCC)  Unsteadiness on feet  Abnormal posture     Problem List Patient Active Problem List   Diagnosis Date Noted  . 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 Seizures ([facility]) 06/21/2015  . Hemorrhage, intracerebral ([facility])  02/03/2015  . Hemiparesis ([facility]) 02/03/2015  . Spastic hemiplegia affecting nondominant side (H[facility]) 02/03/2015   Sallyanne KusterKathy Joyous Gleghorn, PTA, [facility] [address] [place], [place] [address] [phone] 02/21/16, 1:39 AM   Name: Michelle Kemp Rodocker MRN: [id number] Date of Birth: 07/28/1940

## 2016-02-25 ENCOUNTER — Other Ambulatory Visit: Payer: Self-pay

## 2016-02-25 DIAGNOSIS — G8110 Spastic hemiplegia affecting unspecified side: Secondary | ICD-10-CM

## 2016-02-25 DIAGNOSIS — I610 Nontraumatic intracerebral hemorrhage in hemisphere, subcortical: Secondary | ICD-10-CM

## 2016-02-25 DIAGNOSIS — G8190 Hemiplegia, unspecified affecting unspecified side: Secondary | ICD-10-CM

## 2016-02-25 MED ORDER — BACLOFEN 10 MG PO TABS: 10.0000 mg | ORAL_TABLET | Freq: Three times a day (TID) | ORAL | 6 refills | Status: DC

## 2016-02-28 ENCOUNTER — Ambulatory Visit: Payer: Self-pay | Admitting: Physical Therapy

## 2016-03-07 ENCOUNTER — Ambulatory Visit: Payer: Self-pay | Admitting: Physical Therapy

## 2016-03-07 DIAGNOSIS — G8114 Spastic hemiplegia affecting left nondominant side: Secondary | ICD-10-CM

## 2016-03-07 DIAGNOSIS — R2689 Other abnormalities of gait and mobility: Secondary | ICD-10-CM

## 2016-03-07 NOTE — Therapy (Signed)
[facility] [address] [place], [place], [address] Phone: [phone]   Fax:  [phone]  Physical Therapy Treatment  Patient Details  Name: Michelle Kemp MRN: [id number] Date of Birth: 1941/03/06 Referring Provider: Dr. Erlinda Hong  Encounter Date: 03/07/2016      PT End of Session - 03/07/16 1614    Visit Number 16   Number of Visits 18   Date for PT Re-Evaluation 03/10/16   Authorization Type Self pay   PT Start Time 1451   PT Stop Time 1542   PT Time Calculation (min) 51 min   Equipment Utilized During Treatment Gait belt   Activity Tolerance Patient limited by fatigue   Behavior During Therapy [facility] for tasks assessed/performed      Past Medical History:  Diagnosis Date  . Arthritis   . Seizures ([place])   . Stroke [facility])     Past Surgical History:  Procedure Laterality Date  . HERNIA REPAIR  2001    There were no vitals filed for this visit.      Subjective Assessment - 03/07/16 1604    Subjective Pt has been ambulating < 25% of the ttime at home with assistance. Daughter just ordered rolling walker offline; will arrive today. Daughter inquiring about cost of L hand orthosis for RW.   Patient is accompained by: Family member  daughter, [name]   Pertinent History Seizures; pt's dtr reports pt is allergic to Delsym and OTC motrin   Patient Stated Goals To walk again    Currently in Pain? No/denies                         [facility] Adult PT Treatment/Exercise - 03/07/16 0001      Transfers   Transfers Sit to Stand;Stand to Sit   Sit to Stand 4: Min assist   Sit to Stand Details Manual facilitation for weight shifting;Manual facilitation for placement   Sit to Stand Details (indicate cue type and reason) placement of LLE, manual facilitation of anterior weight shift   Stand to Sit 4: Min guard;4: Min assist     Ambulation/Gait   Ambulation/Gait Yes   Ambulation/Gait Assistance 1: +2 Total  assist;4: Min assist;4: Min guard   Ambulation/Gait Assistance Details Gait x75' without AD with max A (+2A for safety, quality of gait pattern) with multimodal cueing for lateral weight shift to R; gait x100' then x75' (seated rest break between trials) with RW, L hand orthosis, and ace bandage at LLE for increase DF assist, and ace bandage anchored to gait belt in attempt to address excessive L hip external rotation (grossly ineffective). Cueing focused on address posterior rotation of trunk on L, which exacerbates L hip ER, L forefoot pronation. When pt not fatigued, pt able to ambulate with min guard with RW and L h/o.    Ambulation Distance (Feet) 255 Feet  15' + 100' + 75'   Assistive device Rolling walker;Other (Comment)  L hand orthosis   Gait Pattern Decreased stance time - left;Decreased step length - left;Decreased hip/knee flexion - left;Decreased dorsiflexion - left;Decreased weight shift to left;Trunk rotated posteriorly on left;Step-through pattern;Poor foot clearance - left;Decreased weight shift to right;Abducted - left  L hip ER, L forefoot pronation   Ambulation Surface Level;Indoor                PT Education - 03/07/16 1605    Education provided Yes   Education Details Discussed holding PT until  after upcoming MD appts (pt sees Dr. Letta Pate and Dr. Erlinda Hong next week). Discussed the fact that functional mobility has plateaued. Recommending daughter contact private pay PT to enable pt to be seen at home. Provided 2 tennis balls for new RW (to increase safety with negotiating thresholds, surface changes) and provided handout for ordering L hand orthosis.    Person(s) Educated Patient;Child(ren)   Methods Explanation;Handout   Comprehension Verbalized understanding          PT Short Term Goals - 02/04/16 2022      PT SHORT TERM GOAL #1   Title STG's = LTG's           PT Long Term Goals - 03/07/16 1615      PT LONG TERM GOAL #1   Title Will trial body weight  support system to decrease pain, increasse pt tolerance to standing and ambulation.  (Target date: 03/08/16)   Baseline Met 8/11.   Status Achieved     PT LONG TERM GOAL #2   Title Daughter will report pt ambulating in home >/= 25% of the time to indicate progress toward pt becoming household ambulator.  (03/08/16)   Baseline 8/28: Per daughter, pt ambulates in home , 25% of the time.   Status Not Met     PT LONG TERM GOAL #3   Title Daughter will verbalize understanding of available resources to continue progressing functional mobility after DC from outpatient PT.  (03/08/16)   Baseline Met 8/29.   Status Achieved               Plan - 03/07/16 1616    Clinical Impression Statement Pt has met 2/3 LTG's, as PT's trialed BWSS, daughter is aware of community resources; however, pt is not ambulating >/= 25%of the time in home. Pt progress in PT limited by attendance (able to attend only 2 of 4 scheduled sessions). Discussed with daughter that pt's functional mobility has plateaued; daughter agrees. When this PT asked daugher if she would like to extend POC to make up 2 missed PT sessions, daughter elected to wait until after appts with neurologist and physiatrist next week.    Rehab Potential Fair   Clinical Impairments Affecting Rehab Potential patient self pay for services   PT Frequency Other (comment)  4 sessions   PT Duration Other (comment)  5 weeks   PT Treatment/Interventions Gait training;Stair training;Neuromuscular re-education;Balance training;Therapeutic exercise;Therapeutic activities;Functional mobility training;Patient/family education;Wheelchair mobility training;Manual techniques;Orthotic Fit/Training;ADLs/Self Care Home Management;DME Instruction   PT Next Visit Plan DC vc recert for 2 missed sessions.   Consulted and Agree with Plan of Care Family member/caregiver;Patient   Family Member Consulted daughter, [name]      Patient will benefit from skilled therapeutic  intervention in order to improve the following deficits and impairments:  Abnormal gait, Decreased balance, Decreased mobility, Decreased strength, Postural dysfunction, Impaired flexibility, Pain, Decreased activity tolerance, Decreased endurance, Increased muscle spasms, Difficulty walking, Impaired tone, Decreased coordination  Visit Diagnosis: Other abnormalities of gait and mobility  Spastic hemiplegia affecting left nondominant side [facility])     Problem List Patient Active Problem List   Diagnosis Date Noted  . Nontraumatic subcortical hemorrhage of right cerebral hemisphere ([place]) 09/15/2015  . Seizures ([place]) 06/21/2015  . Hemorrhage, intracerebral ([place]) 02/03/2015  . Hemiparesis ([place]) 02/03/2015  . Spastic hemiplegia affecting nondominant side ([place]) 02/03/2015    Billie Ruddy, PT, DPT [facility] [address] [place], [place], [address] Phone: [phone]   Fax:  [phone] 03/07/16, 4:20 PM  Name: Jonny Longino MRN: [id number] Date of Birth: 14-Aug-1940

## 2016-03-14 ENCOUNTER — Encounter: Payer: Self-pay | Admitting: Physical Medicine & Rehabilitation

## 2016-03-14 ENCOUNTER — Ambulatory Visit (HOSPITAL_BASED_OUTPATIENT_CLINIC_OR_DEPARTMENT_OTHER): Payer: Self-pay | Admitting: Physical Medicine & Rehabilitation

## 2016-03-14 ENCOUNTER — Encounter: Payer: Self-pay | Attending: Physical Medicine & Rehabilitation

## 2016-03-14 DIAGNOSIS — Z7982 Long term (current) use of aspirin: Secondary | ICD-10-CM | POA: Insufficient documentation

## 2016-03-14 DIAGNOSIS — G8110 Spastic hemiplegia affecting unspecified side: Secondary | ICD-10-CM | POA: Insufficient documentation

## 2016-03-14 DIAGNOSIS — M7062 Trochanteric bursitis, left hip: Secondary | ICD-10-CM

## 2016-03-14 NOTE — Patient Instructions (Signed)
We injected cortisone mixed with lidocaine.  In 7 weeks. We will repeat the Botox injection

## 2016-03-14 NOTE — Progress Notes (Signed)
Subjective:    Patient ID: Michelle Kemp, female    DOB: [birth year], 75 y.o.   MRN: [id number]  HPI 75 year old female with history of left spastic hemiplegia who returns today after botulinum toxin injection performed approximate 6 weeks ago. Her knee extensor spasms have improved significantly. Her daughter no longer gets kicked by accident. Upper extremity tone has improved in the left hand as well. Chief complaint today is left hip pain followed by left shoulder pain  Left hip pain has been going on for several months. No history of trauma to that area. Pain Inventory Average Pain 7 Pain Right Now 6 My pain is sharp  In the last 24 hours, has pain interfered with the following? General activity 0 Relation with others 3 Enjoyment of life 3 What TIME of day is your pain at its worst? evening Sleep (in general) Fair  Pain is worse with: some activites Pain improves with: rest and pacing activities Relief from Meds: not much  Mobility walk with assistance use a cane use a walker how many minutes can you walk? 10 ability to climb steps?  no do you drive?  no use a wheelchair needs help with transfers  Function disabled: date disabled 10/23/14 I need assistance with the following:  dressing, bathing, toileting, meal prep, household duties and shopping  Neuro/Psych No problems in this area  Prior Studies Any changes since last visit?  no  Physicians involved in your care Any changes since last visit?  no   Family History  Problem Relation Age of Onset  . Arthritis    . Stroke Neg Hx    Social History   Social History  . Marital status: Widowed    Spouse name: N/A  . Number of children: 2  . Years of education: N/A   Social History Main Topics  . Smoking status: Never Smoker  . Smokeless tobacco: Never Used  . Alcohol use No  . Drug use: No  . Sexual activity: Not Asked   Other Topics Concern  . None   Social History Narrative   Lives with family    Caffeine use: none    Past Surgical History:  Procedure Laterality Date  . HERNIA REPAIR  2001   Past Medical History:  Diagnosis Date  . Arthritis   . Seizures ([facility])   . Stroke ([facility])    BP (!) 149/106 (BP Location: Right Arm, Patient Position: Sitting, Cuff Size: Large) Comment: says her shoulder hurts  Pulse 75   Resp 16   SpO2 94%   Opioid Risk Score:   Fall Risk Score:  `1  Depression screen PHQ 2/9  Depression screen [facility] 2/9 03/14/2016 08/24/2015 04/12/2015  Decreased Interest 0 0 0  Down, Depressed, Hopeless 1 0 0  PHQ - 2 Score 1 0 0  Altered sleeping - 2 -  Tired, decreased energy - 3 -  Change in appetite - 0 -  Feeling bad or failure about yourself  - 1 -  Trouble concentrating - 0 -  Moving slowly or fidgety/restless - 3 -  Suicidal thoughts - 1 -  PHQ-9 Score - 10 -  Difficult doing work/chores - Not difficult at all -    Review of Systems  Respiratory: Positive for shortness of breath.   Musculoskeletal:       Hip and shoulder pain left side       Objective:   Physical Exam Left finger flexors. [place]worth 2. Left wrist flexors. Ashworth 2 Left elbow  flexors. Ashworth 1 Left pectoralis. Ashworth grade 3 Left knee extensors. Ashworth grade 2. Left shoulder pain with abduction at approximately 90 in addition, has pain with external rotation. Left hip has tenderness to palpation over the trochanteric area. No pain with hip range of motion.        Assessment & Plan:  Trochanteric bursa injection without ultrasound guidance  Indication LEFT Trochanteric bursitis. Exam has tenderness over the greater trochanter of the hip. Pain has not responded to conservative care such as exercise therapy and oral medications. Pain interferes with sleep or with mobility Has history of stroke causing left hemiparesis, limiting her exercise ability  Informed consent was obtained after describing risks and benefits of the procedure with the patient these include  bleeding bruising and infection. Patient has signed written consent form. Patient placed in a lateral decubitus position with the affected hip superior. Point of maximal pain was palpated marked and prepped with Betadine and entered with a needle to bone contact. Needle slightly withdrawn then 6mg  of betamethasone with 4 cc 1% lidocaine were injected. Patient tolerated procedure well. Post procedure instructions given.  2. Left spastic hemiplegia Improvement in spasticity left lower extremity after Botox injection will repeat in 6 weeks with the following doses  FCR50  FDS50 FDP50 FPL25 Pronator25  Left Lower ext VMO 50 U Rectus fem 75U Vast Lat 75U  3. Left shoulder pain, likely combination of spasticity as well as adhesive capsulitis cannot rule out bursitis, subacromial. At this point, we'll hold off on any type of injection. Consider incorporating Botox injection, however, already at maximum dose

## 2016-03-16 ENCOUNTER — Encounter: Payer: Self-pay | Admitting: Neurology

## 2016-03-16 ENCOUNTER — Ambulatory Visit (INDEPENDENT_AMBULATORY_CARE_PROVIDER_SITE_OTHER): Payer: Self-pay | Admitting: Neurology

## 2016-03-16 VITALS — BP 125/81 | HR 75

## 2016-03-16 DIAGNOSIS — R569 Unspecified convulsions: Secondary | ICD-10-CM

## 2016-03-16 DIAGNOSIS — G8110 Spastic hemiplegia affecting unspecified side: Secondary | ICD-10-CM

## 2016-03-16 DIAGNOSIS — I610 Nontraumatic intracerebral hemorrhage in hemisphere, subcortical: Secondary | ICD-10-CM

## 2016-03-16 MED ORDER — BACLOFEN 10 MG PO TABS: ORAL_TABLET | ORAL | 6 refills | Status: AC

## 2016-03-16 NOTE — Patient Instructions (Addendum)
-   continue ASA for stroke prevention - continue keppra for seizure control.  - follow up with Dr. Wynn BankerKirsteins for botox injection and left spastic hemiplegia  - give baclofen 20mg  at night to help for the spasm.  - Follow up with your primary care physician for stroke risk factor modification. Recommend maintain blood pressure goal <130/80, diabetes with hemoglobin A1c goal below 6.5% and lipids with LDL cholesterol goal below 70 mg/dL.  - continue PT/OT and home exercise. Goal is to walk with walker and become more independence - check BP at home. - follow up in 6 months.

## 2016-03-16 NOTE — Progress Notes (Signed)
STROKE NEUROLOGY FOLLOW UP NOTE  NAME: Michelle Kemp DOB: [birth year]  REASON FOR VISIT: stroke follow up HISTORY FROM: daughter and chart  Today we had the pleasure of seeing Michelle Kemp in follow-up at our Neurology Clinic. Pt was accompanied by daughter. Daughter acted as Equities trader.    History Summary 75 yo female from [place] with hx of arthritis is following up in stroke clinic for stroke. As per daughter, she was having vacation in [place] on 11/07/14 when she developed acute onset left arm and leg weakness, left leg stiff and spastic, with facial droop, left side drooling, and slurry speech. She was sent to nearby hospital, BP 200/120 and CT head on 11/13/14 showed right frontal hemorrhage. She was treated conservatively. 12/13/14 had MRA head showed diffuse intracranial atherosclerosis. 01/11/15 repeat CT showed near resolution of ICH. She was then travelled back from [place] to [place]. She denies any smoking, alcohol or illicit drugs.  Follow up 04/12/15 - she has been working with PT/OT for strength training. She saw Dr. Lucia Gaskins for follow up, has ordered MRI and MRA but not done due to insurance coverage. Daughter has been working with Child psychotherapist to get her disability approved.  She still has spastic left arm and leg, but also developed left shoulder, elbow, hip and knee pain which limited her rehab process.   ED visit 05/16/15 - as per chart, patient sent to ED for stroke evaluation. MRI showed no acute stroke. Patient sent back to home. However, asking for daughter, she stated that patient home starting have left facial twitching, and left arm and leg shaking jerking, no loss of the bowel bladder function, no loss of consciousness. When EMS came over, symptoms resolving, with increased weakness on the left arm and leg. In ED, patient back to her baseline. Patient was sent back home without medication changes.  06/21/15 follow up - patient had been doing the same. Her left arm and leg stiffness  since getting worse, decreased range of motion on shoulder, elbow and knee. With overstretching, patient started to feel pain in joints. Her MRI in November showed chronic right frontal ICH, no CAA, did not favor hemorrhagic transformation. She had MRA done today showed diffuse intracranial stenosis. 2-D Echo unremarkable. Carotid Doppler not done yet. Had recent lab with PCP, was told everything is good. Blood pressure at home ranging from 112-142. Today in clinic 124/90. Patient had PT OT twice a week.  09/15/15 follow up - pt has been doing well. No recurrent seizure or stroke. Continued with PT/OT twice a week and also followed with Dr. Wynn Banker for botox injection and rehab management. She left LE strength improved and able to walk with walker briefly at home. Daughter showed me her video at home. However, for the last session of PT, she started to have left knee and hip pain, therefore today she came in with wheelchair. Her left UE still has spastic paralysis, not able to move with limited ROM of left shoulder. Her CUS and EEG were negative and A1C 4.7 with LDL 107 in 04/2015. Today BP 150/95. She is on ASA.   Interval History During the interval time, pt has been doing better. Able to walk with cane and the help from daughter for short distance, then she got tired and SOB. As per daughter, pt at home do 40 feet in the am and 50 feet in pm with cane and help from daughter. However, she still has a lot of spasticity at left UE and LE  especially at night, with pain at hip and shoulder. Currently, she is getting botox and steroids injection from Dr. Wynn Banker. BP today 125/81. Daughter reports one episode of left facial twitching lasting seconds. Other than that, no seizure like activity and tolerating with keppra well. She is taking baclofen 10mg  bid now. I asked daughter to increase baclofen dose at night to 20mg .   REVIEW OF SYSTEMS: Full 14 system review of systems performed and notable only for those  listed below and in HPI above, all others are negative:  Constitutional:   Cardiovascular:  Ear/Nose/Throat:   Skin:  Eyes:   Respiratory:   Gastroitestinal:   Genitourinary:  Hematology/Lymphatic:   Endocrine:  Musculoskeletal:   Allergy/Immunology:   Neurological:  weakness Psychiatric:  Sleep:   The following represents the patient's updated allergies and side effects list: No Known Allergies  The neurologically relevant items on the patient's problem list were reviewed on today's visit.  Neurologic Examination  A problem focused neurological exam (12 or more points of the single system neurologic examination, vital signs counts as 1 point, cranial nerves count for 8 points) was performed.  Blood pressure 125/81, pulse 75.  General - Well nourished, well developed, in no apparent distress.  Ophthalmologic - Fundi not visualized due to noncooperation.  Cardiovascular - intermittent premature beat.  Mental Status -  Level of arousal and orientation to time, place, and person were intact. Language including expression, naming, repetition, comprehension was assessed and found intact. Fund of Knowledge was assessed and was impaired.  Cranial Nerves II - XII - II - Visual field intact OU. III, IV, VI - Extraocular movements intact. V - Facial sensation intact bilaterally. VII - mild left nasolabial fold flattening. VIII - Hearing & vestibular intact bilaterally. X - Palate elevates symmetrically. XI - Chin turning & shoulder shrug intact bilaterally. XII - Tongue protrusion intact.  Motor Strength - The patient's strength was LUE 0/5 proximal and 0/5 distal, LLE 3-/5 proximal and 3/5 knee extension and 0/5 distally.  Bulk was normal and fasciculations were absent.   Motor Tone - Muscle tone was assessed at the neck and appendages and was significantly increased left UE and LE. Limited ROM of left shoulder and elbow, as well as knee.   Reflexes - The patient's reflexes  were 2+ in all extremities and she had no pathological reflexes.  Sensory - Light touch, temperature/pinprick were assessed and were normal.    Coordination - The patient had normal movements in the hands with no ataxia or dysmetria.  Tremor was absent.  Gait and Station - in wheelchair, able to walk with cane and help from daughter for 10 steps in room, significant left hemiplegic gait.  Data reviewed: I personally reviewed the images and agree with the radiology interpretations.  11/13/14 CT head - right frontal hemorrhage with remote small left cerebellar infarct  12/13/14 MRA head - diffuse intracranial atherosclerosis  01/11/15 CT head - resolving right frontal hemorrhage with remote small left cerebellar infarct.  MRI brain 05/16/2015 1. No acute infarct identified. 2. Posterior right frontal lobe encephalomalacia with internal complex fluid, favored to be hemorrhage with unresolved extracellular methemoglobin. Surrounding gliosis. No associated mass effect. 3. Intracranial artery dolichoectasia. Chronic lacunar infarcts Elsewhere.  MRA head 06/21/2015 1. Possible mild stenosis within the M2 segment of the right middle cerebral artery. This is less apparent on the source images and could be artifactual. 2. Reduced flow of the left vertebral artery. No focal stenosis is noted. If clinically  indicated, consider MR angiogram of the neck arteries to assess the origin of the vertebral artery. 3. Small regions of focal stenosis within the right posterior cerebral artery. 4. No aneurysms were identified.  2-D echo 05/14/2015 - Left ventricle: The cavity size was normal. Systolic function was normal. The estimated ejection fraction was in the range of 60% to 65%. Wall motion was normal; there were no regional wall motion abnormalities. Doppler parameters are consistent with abnormal left ventricular relaxation (grade 1 diastolic dysfunction). There was no evidence of  elevated ventricular filling pressure by Doppler parameters. - Aortic valve: There was mild regurgitation. - Aortic root: The aortic root was normal in size. - Mitral valve: Structurally normal valve. There was mild regurgitation. - Left atrium: The atrium was normal in size. - Right ventricle: The cavity size was normal. Wall thickness was normal. Systolic function was normal. - Right atrium: The atrium was normal in size. - Tricuspid valve: There was trivial regurgitation. - Pulmonic valve: There was trivial regurgitation. - Pulmonary arteries: Systolic pressure was within the normal range. - Inferior vena cava: The vessel was normal in size. - Pericardium, extracardiac: There was no pericardial effusion.  Labs 04/27/15 A1C 4.7 Total Choles 193 LDL 107 HDL 71 TG 75 Cre 0.82  Assessment: As you may recall, she is a 75 y.o. African American female with PMH of arthritis had acute onset left sided weakness and dysarthria on 11/07/14. CT was done on 11/13/14 showed right frontal ICH. She was treated conservatively. MRA in 12/2014 showed diffuse atherosclerosis. Repeat CT head in 01/2015 showed near resolution of ICH with remote small left cerebellar infarct. Due to the delay of CT as well as without MRI image, it is hard to tell for sure it was ICH vs. Hemorrhagic infarct. Since the hematoma is absorbed, will start her on ASA 81. Had ED visit on 05/16/2015 for left facial twitching and left arm and leg shaking jerking lasting short period time, concerning first simple partial seizure. MRI brain favor chronic right frontal ICH. MRA head again showed diffuse intracranial atherosclerosis. TTE, CUS and EEG unremarkable, A1C 4.7 and LDL 107. Continue PT/OT and follow up with Dr. Wynn BankerKirsteins for botox and left sided spasticity post stroke. Continue Keppra for seizure control. Pt daughter report one episode of left facial twitching during the interval time, but short lasting, only seconds. Still has  significant left spastic hemiplegia. Able to walk with cane a little bit with help from daughter. Will encourage to work on walker.   Plan:  - continue ASA for stroke prevention - continue keppra for seizure control.  - follow up with Dr. Wynn BankerKirsteins for botox injection and left spastic hemiplegia  - increase baclofen to 20mg  at night to help for the spasm.  - Follow up with your primary care physician for stroke risk factor modification. Recommend maintain blood pressure goal <130/80, diabetes with hemoglobin A1c goal below 6.5% and lipids with LDL cholesterol goal below 70 mg/dL.  - continue PT/OT and home exercise. Goal is to walk with walker and become more independence - check BP at home. - follow up in 6 months.  A total of 25 minutes was spent face-to-face with this patient. Over half this time was spent on counseling patient on the continued PT OT and home exercise, follow up with botox and steroids injection as well as seizure control.   No orders of the defined types were placed in this encounter.   Meds ordered this encounter  Medications  .  baclofen (LIORESAL) 10 MG tablet    Sig: 10mg  Qam and 20mg  Qhs    Dispense:  90 each    Refill:  6    Patient Instructions  - continue ASA for stroke prevention - continue keppra for seizure control.  - follow up with Dr. Wynn Banker for botox injection and left spastic hemiplegia  - give baclofen 20mg  at night to help for the spasm.  - Follow up with your primary care physician for stroke risk factor modification. Recommend maintain blood pressure goal <130/80, diabetes with hemoglobin A1c goal below 6.5% and lipids with LDL cholesterol goal below 70 mg/dL.  - continue PT/OT and home exercise. Goal is to walk with walker and become more independence - check BP at home. - follow up in 6 months.   Marvel Plan, MD PhD [facility] [address] [place], [place] [address] [phone]  L

## 2016-04-06 ENCOUNTER — Telehealth: Payer: Self-pay | Admitting: Neurology

## 2016-04-06 DIAGNOSIS — R569 Unspecified convulsions: Secondary | ICD-10-CM

## 2016-04-06 DIAGNOSIS — G8110 Spastic hemiplegia affecting unspecified side: Secondary | ICD-10-CM

## 2016-04-06 MED ORDER — LEVETIRACETAM 500 MG PO TABS: 500.0000 mg | ORAL_TABLET | Freq: Two times a day (BID) | ORAL | 3 refills | Status: DC

## 2016-04-06 NOTE — Telephone Encounter (Signed)
I spoke to Dr. Vickey Hugerohmeier. She gave me a verbal order to order OT for this pt for her spastic hemiplegia and send to neurorehab. Order placed.

## 2016-04-06 NOTE — Telephone Encounter (Signed)
I called [facility]. They report that the pt picked up a 30 day supply of keppra on 04/03/2016 but does not have any remaining refills.   I called pt's daughter, Raynelle FanningJulie, per DPR. I advised her that the keppra will be refilled and sent to [facility].   Pt's daughter reports that they are interested in doing OT again. They called neurorehab but was told that the pt has been "checked out" and the doctor would have to reorder the OT order. I advised her that Dr. Roda ShuttersXu is not here this week and I would speak to another doctor regarding that order. Pt's daughter verbalized understanding.

## 2016-04-06 NOTE — Telephone Encounter (Signed)
Daughter Raynelle FanningJulie called to request refill of levETIRAcetam (KEPPRA) 500 MG tablet and to advise that rehab for OT needs authorization.

## 2016-04-21 ENCOUNTER — Ambulatory Visit: Payer: Self-pay | Admitting: Physical Therapy

## 2016-04-26 ENCOUNTER — Ambulatory Visit: Payer: Self-pay | Attending: Neurology | Admitting: Physical Therapy

## 2016-04-26 DIAGNOSIS — G8114 Spastic hemiplegia affecting left nondominant side: Secondary | ICD-10-CM | POA: Insufficient documentation

## 2016-04-26 DIAGNOSIS — G8929 Other chronic pain: Secondary | ICD-10-CM | POA: Insufficient documentation

## 2016-04-26 DIAGNOSIS — R293 Abnormal posture: Secondary | ICD-10-CM | POA: Insufficient documentation

## 2016-04-26 DIAGNOSIS — M25612 Stiffness of left shoulder, not elsewhere classified: Secondary | ICD-10-CM | POA: Insufficient documentation

## 2016-04-26 DIAGNOSIS — M25512 Pain in left shoulder: Secondary | ICD-10-CM | POA: Insufficient documentation

## 2016-04-26 DIAGNOSIS — R2681 Unsteadiness on feet: Secondary | ICD-10-CM | POA: Insufficient documentation

## 2016-04-27 ENCOUNTER — Ambulatory Visit: Payer: Self-pay | Admitting: Occupational Therapy

## 2016-04-27 ENCOUNTER — Encounter: Payer: Self-pay | Admitting: Occupational Therapy

## 2016-04-27 DIAGNOSIS — R2681 Unsteadiness on feet: Secondary | ICD-10-CM

## 2016-04-27 DIAGNOSIS — M25612 Stiffness of left shoulder, not elsewhere classified: Secondary | ICD-10-CM

## 2016-04-27 DIAGNOSIS — G8114 Spastic hemiplegia affecting left nondominant side: Secondary | ICD-10-CM

## 2016-04-27 DIAGNOSIS — R293 Abnormal posture: Secondary | ICD-10-CM

## 2016-04-27 DIAGNOSIS — G8929 Other chronic pain: Secondary | ICD-10-CM

## 2016-04-27 DIAGNOSIS — M25512 Pain in left shoulder: Secondary | ICD-10-CM

## 2016-04-27 NOTE — Therapy (Signed)
[facility] [address] [place], [place], [address] Phone: [phone]   Fax:  [phone]  Occupational Therapy Evaluation  Patient Details  Name: Michelle Kemp MRN: [id number] Date of Birth: [birth year] Referring Provider: Dr Roda Shutters  Encounter Date: 04/27/2016      OT End of Session - 04/27/16 1715    Visit Number 1   Number of Visits 9   Date for OT Re-Evaluation 06/08/16   Authorization Type self pay   OT Start Time 1533   OT Stop Time 1615   OT Time Calculation (min) 42 min   Activity Tolerance Patient limited by fatigue   Behavior During Therapy Flat affect      Past Medical History:  Diagnosis Date  . Arthritis   . Seizures (HCC)   . Stroke [facility])     Past Surgical History:  Procedure Laterality Date  . HERNIA REPAIR  2001    There were no vitals filed for this visit.      Subjective Assessment - 04/27/16 1655    Subjective  Patient with flat affect today - limited eye contact initially, stating she is hurting all over   Patient is accompained by: Family member   Currently in Pain? Yes   Pain Score --  Unable to score   Pain Location Hip   Pain Orientation Left   Pain Descriptors / Indicators Aching   Pain Type Acute pain   Pain Onset 1 to 4 weeks ago   Pain Frequency Intermittent   Aggravating Factors  unsure   Pain Relieving Factors heat/ ice   Multiple Pain Sites Yes   Pain Location Knee   Pain Orientation Left   Pain Descriptors / Indicators Aching   Pain Type Chronic pain   Pain Onset More than a month ago   Pain Frequency Intermittent   Aggravating Factors  unsure   Pain Relieving Factors heat/ice   Effect of Pain on Daily Activities limits standing and walking   Pain Location Shoulder   Pain Orientation Left   Pain Descriptors / Indicators Aching   Pain Type Chronic pain   Pain Radiating Towards mid lateral humerus   Pain Onset More than a month ago   Pain Frequency  Intermittent   Aggravating Factors  range of motion   Pain Relieving Factors rest, ice/heat           [facility] OT Assessment - 04/27/16 0001      Assessment   Diagnosis spastic hemiplegia non dominant side with 7/17 botox injection to left hand   Referring Provider Dr Roda Shutters   Prior Therapy Has been seen for OP OT IN months prior     Precautions   Precautions Fall   Other Brace/Splint resting hand splint     Restrictions   Weight Bearing Restrictions No     Balance Screen   Has the patient fallen in the past 6 months No   Has the patient had a decrease in activity level because of a fear of falling?  No   Is the patient reluctant to leave their home because of a fear of falling?  No     Home  Environment   Living Arrangements Children   Available Help at Discharge Available PRN/intermittently   Type of Home Aartment   Home Access Level entry   Shower/tub characteristics Curtain   Bathroom Toilet Standard   Bathroom Accessibility Yes   How accessible Accessible via wheelchair;Accessible via walker  rolling shower /  commode chair   Lives With Family;Daughter     Prior Function   Level of Independence Independent with basic ADLs   Leisure cooking, watching movies, dance     ADL   Eating/Feeding Set up   Grooming Set up   Upper Body Bathing Moderate assistance   Lower Body Bathing Maximal assistance   Where Assesed-Lower Body Bathing Supported standing   Upper Body Dressing Moderate assistance   Lower Body Dressing Maximal assistance   Toilet Tranfer Moderate assistance   Toilet Transfer Method Stand pivot   Transfers/Ambulation Related to ADL's Was ambulating with cane at least twice / daily household distances with assistance from family     IADL   Prior Level of Function Light Housekeeping independent   Light Housekeeping Does not participate in any housekeeping tasks   Prior Level of Function Meal Prep independent   Meal Prep Needs to have meals prepared and served    Prior Level of Function International aid/development worker Relies on family or friends for transportation     Written Expression   Dominant Hand Right     Vision - History   Baseline Vision No visual deficits     Cognition   Overall Cognitive Status Impaired/Different from baseline   Area of Impairment Attention;Following commands;Safety/judgement;Awareness;Problem solving   Current Attention Level Focused   Following Commands Follows one step commands consistently   Safety/Judgement Decreased awareness of safety;Decreased awareness of deficits   Awareness Intellectual     Observation/Other Assessments   Focus on Therapeutic Outcomes (FOTO)  not completed      Sensation   Light Touch Appears Intact     Coordination   Gross Motor Movements are Fluid and Coordinated No   Fine Motor Movements are Fluid and Coordinated No   Coordination and Movement Description dense hemiplegia     Perception   Perception Impaired   Spatial Orientation limited weight shift forward and to left side in sitting and standing     ROM / Strength   AROM / PROM / Strength PROM     AROM   Overall AROM  Deficits   Overall AROM Comments Patient showing only minimal active movement in index finger, and nowhere else in left UE this session     PROM   Overall PROM  Deficits   Overall PROM Comments In sitting. patient limited to ~70 degrees of shoulder flexion before report of pain.  Patient with inferior subluxation left GH joint   Right/Left Shoulder Left   Left Shoulder Flexion 75 Degrees   Left Shoulder ABduction 70 Degrees   Left Shoulder Internal Rotation 80 Degrees   Left Shoulder External Rotation 15 Degrees   Left Elbow Flexion 125   Left Elbow Extension 170   Left Forearm Pronation 90 Degrees   Left Forearm Supination 60 Degrees     Strength   Overall Strength Deficits   Overall Strength Comments NT due to lack of active movement and hypertonicity     Hand Function    Left Hand Gross Grasp Impaired   Left Hand Grip (lbs) unable   Left Hand Lateral Pinch 0 lbs     LUE Tone   LUE Tone Mild;Moderate;Hypertonic;Modified Ashworth     LUE Tone   Hypertonic Details shoulder   Modified Ashworth Scale for Grading Hypertonia LUE More marked increase in muscle tone through most of the ROM, but affected part(s) easily moved  OT Education - 04/27/16 1712    Education provided Yes   Education Details Discussed potential OT goals of reducing pain, easing caregiver burden, and attempt to illicit functional grasp/release.  Discussed that prognosis for any arm function at this time is guarded, but that pain should be able to be reduced / managed   Person(s) Educated Patient;Child(ren)   Methods Explanation   Comprehension Verbalized understanding;Need further instruction          OT Short Term Goals - 04/27/16 1733      OT SHORT TERM GOAL #1   Title xxxx   Baseline xxxx   Time 0     OT SHORT TERM GOAL #2   Title xxxx   Baseline xxx   Time 0     OT SHORT TERM GOAL #3   Title xxxx   Baseline xxxx   Time 0     OT SHORT TERM GOAL #4   Title xxxx   Baseline xxxx   Time 0     OT SHORT TERM GOAL #5   Title xxxx   Baseline xxxx   Time 0           OT Long Term Goals - 04/27/16 1728      OT LONG TERM GOAL #1   Title Patient and caregiver will independently complete HEP  for LUE with pain report no greater than 1-2/10   Baseline dependent - caregiver performs   Time 4   Status New     OT LONG TERM GOAL #2   Title Patient will demonstrate 90 degrees of shoulder flexion, abduction without report of pain to aide in bathing and dressing    Baseline 75/70   Time 4   Period Weeks   Status New     OT LONG TERM GOAL #3   Title Patient will grasp and release 1-3 inch object, with left hand, with minimal assistance,  as part of bimanual ADL tas   Baseline unable   Time 4   Period Weeks   Status New      OT LONG TERM GOAL #4   Title xxxxx   Baseline xxxx   Time 0     OT LONG TERM GOAL #5   Title xxxx   Baseline xxxxx   Time 0     OT LONG TERM GOAL #6   Title xxx   Time 0               Plan - 04/27/16 1716    Clinical Impression Statement Patient is a 75 year old woman well known to this therapist from previous OP therapy.  Patient has received a series of Botox injections in her left arm to help manage spasticity, and pain.  Patient received last injections in July 2017.  Patient at last neurology visit indicated a desire to increase functional use of left arm, and therefore MD recommended another round of OP OT.  Patient currently requires minimal to maximal assistance with basic self care skills depending on level of participation and pain on  a given day.  Family is supportive and several family members particiapte in patient's care, although daughter Raynelle FanningJulie is primary caregiver.  Patient may benefit from OT to re-establish a home exercise program to help manage pain and spasticity, and promote proper positioning for comfort, to assess potential functional use in left arm and hand.    Rehab Potential Fair   Clinical Impairments Affecting Rehab Potential limited financial resources   OT  Frequency 2x / week   OT Duration 4 weeks   OT Treatment/Interventions Self-care/ADL training;Electrical Stimulation;Cryotherapy;Moist Heat;Visual/perceptual remediation/compensation;Patient/family education;Balance training;Splinting;Therapeutic exercises;Therapeutic activities;Neuromuscular education;Functional Mobility Training;Fluidtherapy;Biofeedback;DME and/or AE instruction;Cognitive remediation/compensation;Ultrasound   Plan Establish stretching program shoulder, elbow, forearm, wrist, digits if [name] present, Determine stretches that patient could do on her own, NMR LUE   Consulted and Agree with Plan of Care Patient;Family member/caregiver   Family Member Consulted Daughter, [name]       Patient will benefit from skilled therapeutic intervention in order to improve the following deficits and impairments:  Decreased activity tolerance, Decreased balance, Decreased coordination, Decreased endurance, Impaired flexibility, Impaired perceived functional ability, Decreased safety awareness, Decreased range of motion, Decreased mobility, Decreased knowledge of use of DME, Decreased knowledge of precautions, Decreased strength, Increased edema, Increased muscle spasms, Impaired sensation, Pain, Impaired tone, Impaired UE functional use, Impaired vision/preception, Decreased cognition  Visit Diagnosis: 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 Greater trochanteric bursitis of left hip 03/14/2016  . 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 Seizures (HCC) 06/21/2015  . Hemorrhage, intracerebral (HCC) 02/03/2015  . Hemiparesis (HCC) 02/03/2015  . Spastic hemiplegia affecting nondominant side (HCC) 02/03/2015    Collier Salina, OTR/L 04/27/2016, 5:38 PM  [facility] [address] [place], [place], [address] Phone: [phone]   Fax:  [phone]  Name: Alishah Schulte MRN: [id number] Date of Birth: 15-Jan-1941

## 2016-04-30 IMAGING — MR MR HEAD W/O CM
8 of 10 series · 37 of 48 positions shown · non-contrast
Comparison: Head CT without contrast 4458 hours, 01/18/2015.

CLINICAL DATA: 74-year-old female with previous hemorrhagic stroke.
Increased left side weakness since 6786 hours. Initial encounter.

EXAM:
MRI HEAD WITHOUT CONTRAST
TECHNIQUE: Multiplanar, multiecho pulse sequences of the brain and surrounding
structures were obtained without intravenous contrast.

[Series 3: T1 · sagittal · 5.0mm · 0.47mm/px · 1 of 25 slices shown]
[im 1/25]
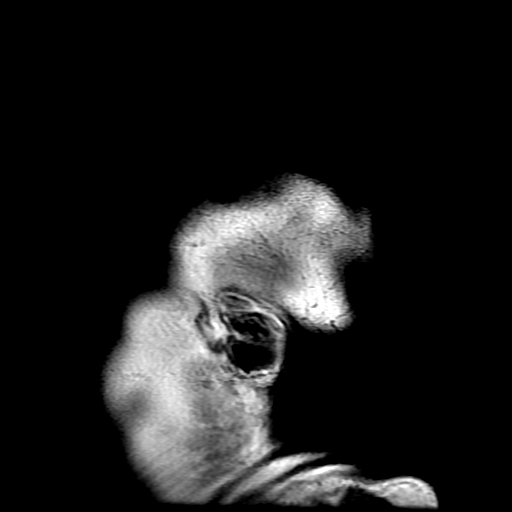

[Series 4: DWI · axial · 3.0mm · 1.09mm/px · z∈[-51,+92]mm · 9 of 100 slices shown (1 of 4)]
[im 1/100]
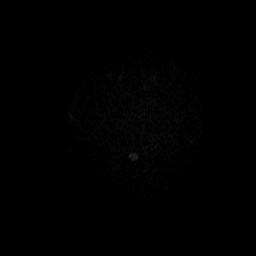
[im 19/100]
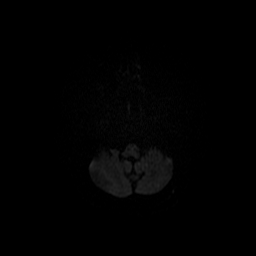
[im 28/100]
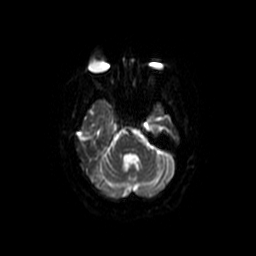
[im 46/100]
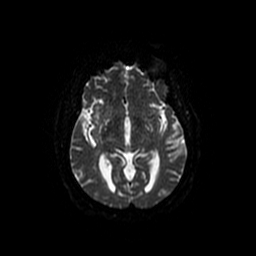
[im 55/100]
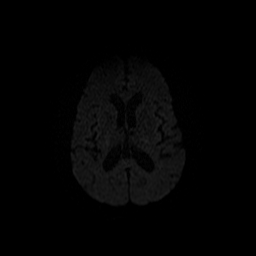
[im 73/100]
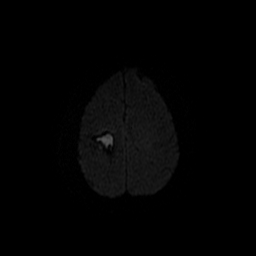
[im 82/100]
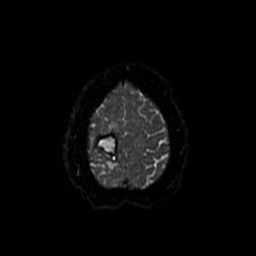
[im 91/100]
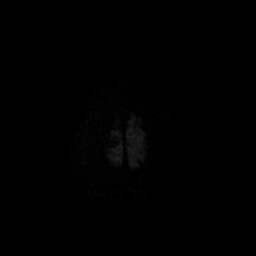
[im 100/100]
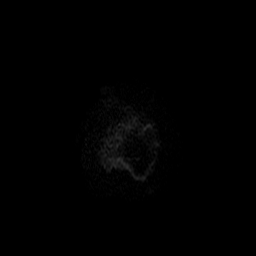

[Series 5: T2 · axial · 5.0mm · 0.43mm/px · z∈[-52,+88]mm · 3 of 25 slices shown (1 of 2)]
[im 1/25]
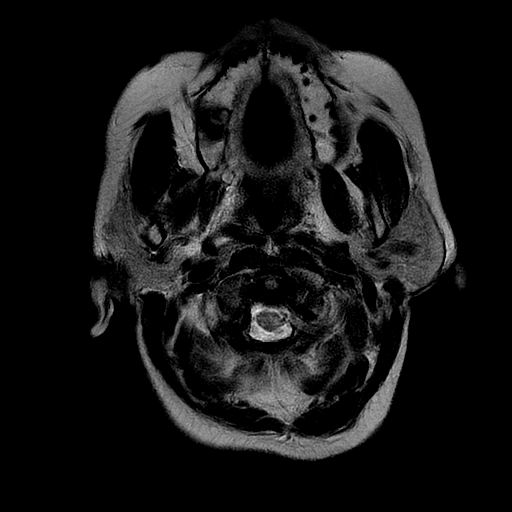
[im 13/25]
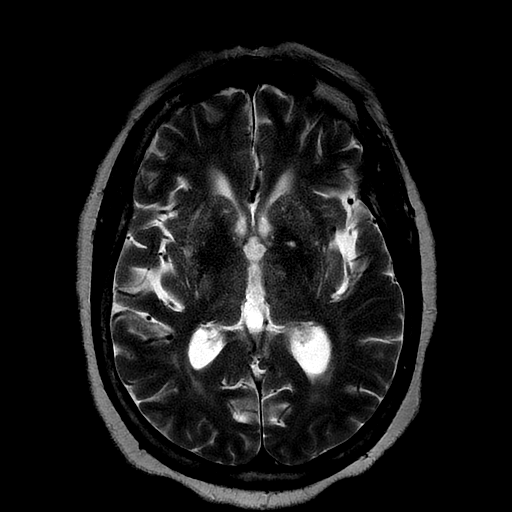
[im 25/25]
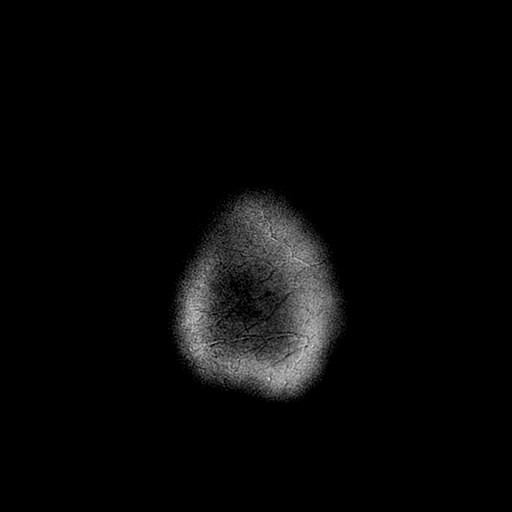

[Series 6: FLAIR · axial · 5.0mm · 0.43mm/px · z∈[-52,+88]mm · 3 of 25 slices shown]
[im 1/25]
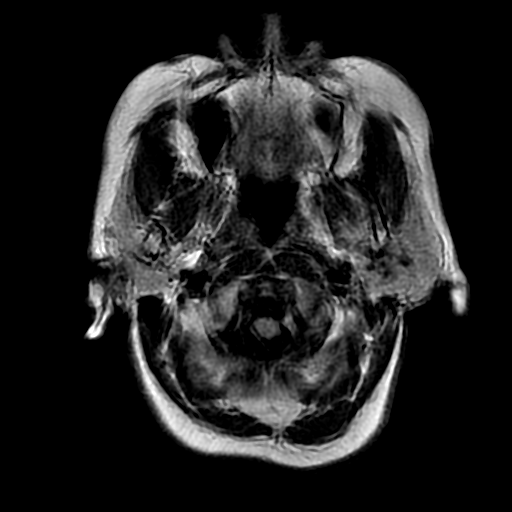
[im 13/25]
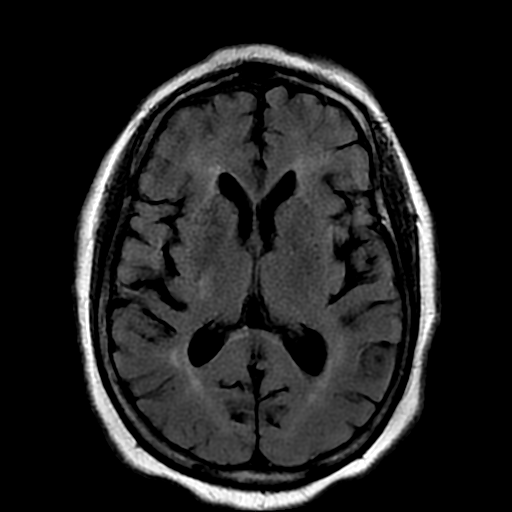
[im 25/25]
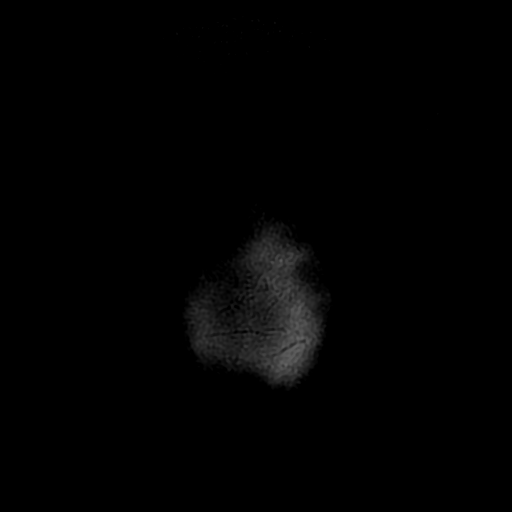

[Series 9: T2 · coronal · 5.0mm · 0.43mm/px · 3 of 29 slices shown (2 of 2)]
[im 1/29]
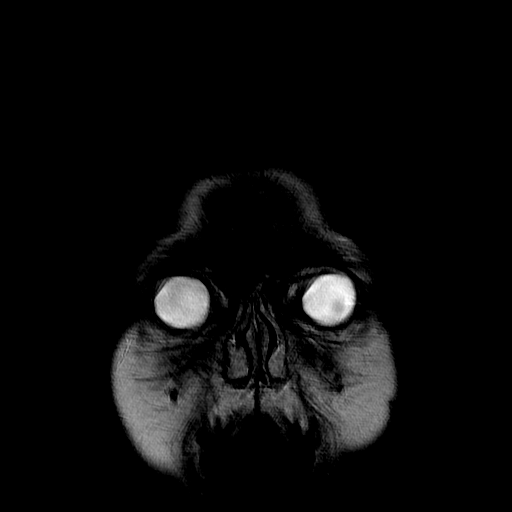
[im 15/29]
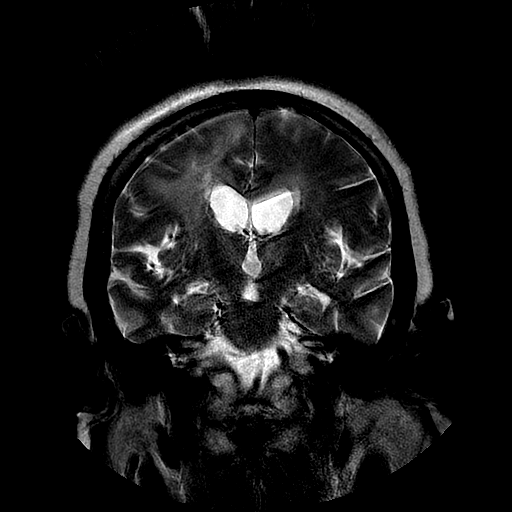
[im 29/29]
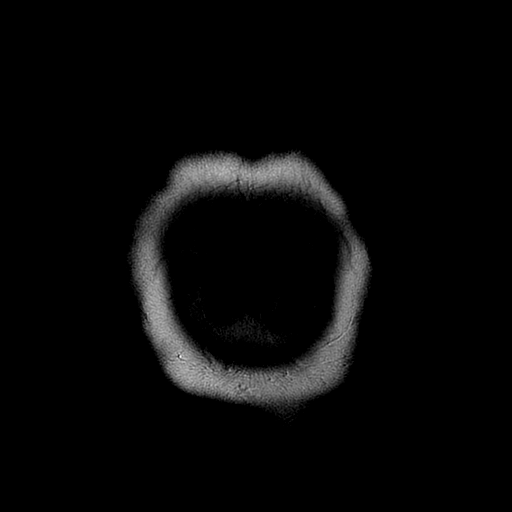

[Series 10: DWI · coronal · 5.0mm · 1.09mm/px · 8 of 68 slices shown (2 of 4)]
[im 1/68]
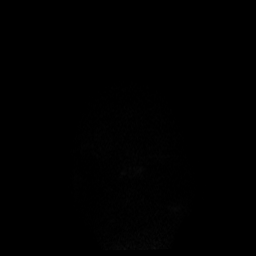
[im 10/68]
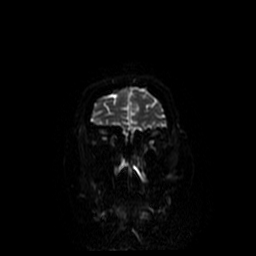
[im 20/68]
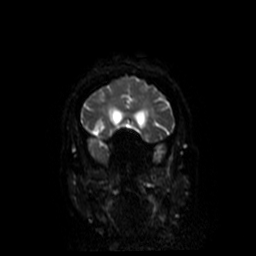
[im 29/68]
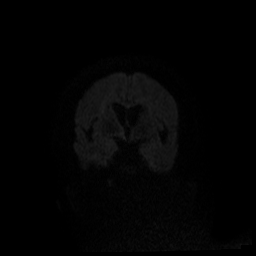
[im 39/68]
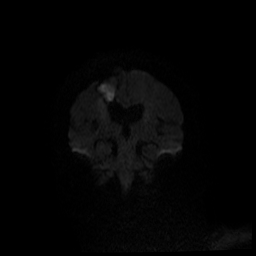
[im 48/68]
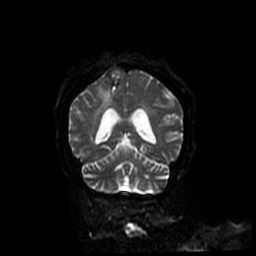
[im 58/68]
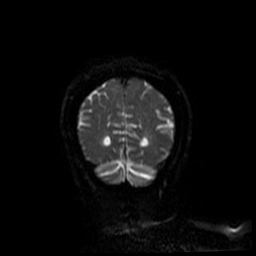
[im 68/68]
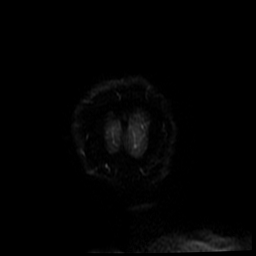

[Series 400: DWI · axial · 3.0mm · 1.09mm/px · z∈[-51,+92]mm · 6 of 50 slices shown (3 of 4)]
[im 1/50]
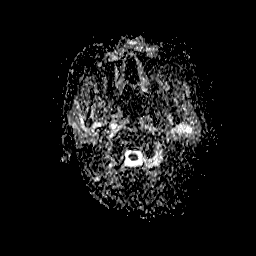
[im 10/50]
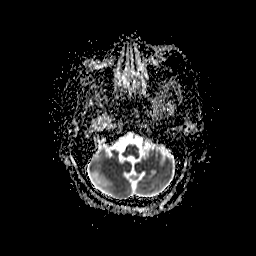
[im 20/50]
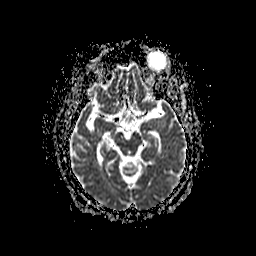
[im 30/50]
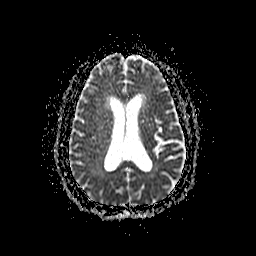
[im 40/50]
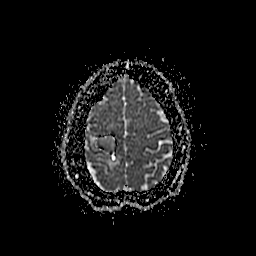
[im 50/50]
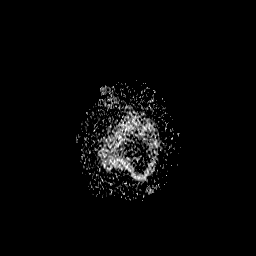

[Series 1000: DWI · coronal · 5.0mm · 1.09mm/px · 4 of 34 slices shown (4 of 4)]
[im 1/34]
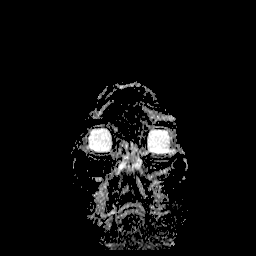
[im 12/34]
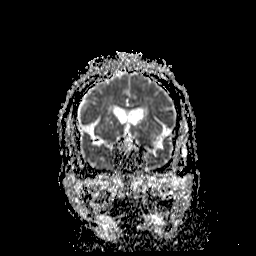
[im 23/34]
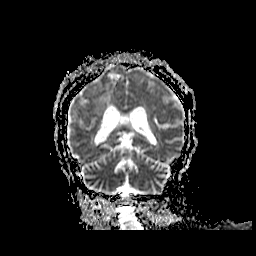
[im 34/34]
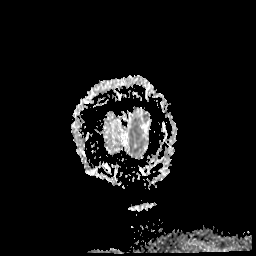

[37 of 48 positions shown; findings below may reference images not displayed]

FINDINGS: Wedge-shaped T1 and T2 hyperintense collections situated at the
right superior frontal gyrus partially abutting the motor strip with
surrounding T2* signal drop. This is partially restricted on
diffusion. The lesion extends toward the right lateral ventricle
with mild ex vacuo enlargement. This area was hypodense by CT. There
is surrounding T2 and FLAIR hyperintensity without mass effect.

No other restricted diffusion. Major intracranial vascular flow
voids are preserved with generalized intracranial artery
dolichoectasia.

Patchy and confluent white matter signal elsewhere in both
hemispheres. T2 heterogeneity in the deep gray matter nuclei. Small
chronic micro hemorrhages at the right frontal horn and medial left
thalamus. Patchy T2 hyperintensity in the pons. Small chronic
infarct in the low left cerebellar hemisphere.

No midline shift, mass effect, ventriculomegaly, extra-axial
collection or acute intracranial hemorrhage. Cervicomedullary
junction and pituitary are within normal limits. Negative visualized
cervical spine. Normal bone marrow signal. Postoperative changes to
the right globe. Paranasal sinuses and mastoids are clear. Negative
scalp soft tissues.
IMPRESSION: 1. No acute infarct identified.
2. Posterior right frontal lobe encephalomalacia with internal
complex fluid, favored to be hemorrhage with unresolved
extracellular methemoglobin. Surrounding gliosis. No associated mass
effect.
3. Intracranial artery dolichoectasia. Chronic lacunar infarcts
elsewhere.

## 2016-05-02 ENCOUNTER — Ambulatory Visit (HOSPITAL_BASED_OUTPATIENT_CLINIC_OR_DEPARTMENT_OTHER): Payer: Self-pay | Admitting: Physical Medicine & Rehabilitation

## 2016-05-02 ENCOUNTER — Encounter: Payer: Self-pay | Attending: Physical Medicine & Rehabilitation

## 2016-05-02 ENCOUNTER — Encounter: Payer: Self-pay | Admitting: Physical Medicine & Rehabilitation

## 2016-05-02 VITALS — BP 153/98 | HR 65 | Resp 14

## 2016-05-02 DIAGNOSIS — G8110 Spastic hemiplegia affecting unspecified side: Secondary | ICD-10-CM

## 2016-05-02 DIAGNOSIS — Z7982 Long term (current) use of aspirin: Secondary | ICD-10-CM | POA: Insufficient documentation

## 2016-05-02 MED ORDER — TRAMADOL HCL 50 MG PO TABS: 50.0000 mg | ORAL_TABLET | Freq: Two times a day (BID) | ORAL | 1 refills | Status: AC | PRN

## 2016-05-02 NOTE — Patient Instructions (Signed)

## 2016-05-02 NOTE — Progress Notes (Signed)
Botox Injection for spasticity using needle EMG guidance  Dilution: 50 Units/ml Indication: Severe spasticity which interferes with ADL,mobility and/or  hygiene and is unresponsive to medication management and other conservative care Informed consent was obtained after describing risks and benefits of the procedure with the patient. This includes bleeding, bruising, infection, excessive weakness, or medication side effects. A REMS form is on file and signed. Needle: 50mm 25 g needle electrode Number of units per muscle FCR50  FDS50 FDP50 FPL25 Pronator25  Left Lower ext VMO 50 U Rectus fem 75U Vast Lat 75U All injections were done after obtaining appropriate EMG activity and after negative drawback for blood. The patient tolerated the procedure well. Post procedure instructions were given. A followup appointment was made.  

## 2016-05-04 ENCOUNTER — Ambulatory Visit: Payer: Self-pay | Admitting: Occupational Therapy

## 2016-05-16 ENCOUNTER — Encounter: Payer: Self-pay | Admitting: Occupational Therapy

## 2016-05-16 ENCOUNTER — Ambulatory Visit: Payer: Self-pay | Attending: Neurology | Admitting: Occupational Therapy

## 2016-05-16 DIAGNOSIS — G8929 Other chronic pain: Secondary | ICD-10-CM

## 2016-05-16 DIAGNOSIS — R2689 Other abnormalities of gait and mobility: Secondary | ICD-10-CM | POA: Insufficient documentation

## 2016-05-16 DIAGNOSIS — G8110 Spastic hemiplegia affecting unspecified side: Secondary | ICD-10-CM | POA: Insufficient documentation

## 2016-05-16 DIAGNOSIS — M25512 Pain in left shoulder: Secondary | ICD-10-CM | POA: Insufficient documentation

## 2016-05-16 DIAGNOSIS — G8114 Spastic hemiplegia affecting left nondominant side: Secondary | ICD-10-CM

## 2016-05-16 DIAGNOSIS — M25612 Stiffness of left shoulder, not elsewhere classified: Secondary | ICD-10-CM

## 2016-05-16 DIAGNOSIS — R293 Abnormal posture: Secondary | ICD-10-CM

## 2016-05-16 DIAGNOSIS — R2681 Unsteadiness on feet: Secondary | ICD-10-CM

## 2016-05-16 DIAGNOSIS — I610 Nontraumatic intracerebral hemorrhage in hemisphere, subcortical: Secondary | ICD-10-CM | POA: Insufficient documentation

## 2016-05-16 NOTE — Therapy (Signed)
[facility] [address] [place], [place], [address] Phone: [phone]   Fax:  [phone]  Occupational Therapy Treatment  Patient Details  Name: Michelle Kemp MRN: [id number] Date of Birth: 05/31/1941 Referring Provider: Dr Roda ShuttersXu  Encounter Date: 05/16/2016      OT End of Session - 05/16/16 1638    Visit Number 2   Number of Visits 9   Date for OT Re-Evaluation 06/08/16   Authorization Type self pay   OT Start Time 1445   OT Stop Time 1532   OT Time Calculation (min) 47 min   Activity Tolerance Patient tolerated treatment well   Behavior During Therapy [facility] for tasks assessed/performed      Past Medical History:  Diagnosis Date  . Arthritis   . Seizures (HCC)   . Stroke [facility](HCC)     Past Surgical History:  Procedure Laterality Date  . HERNIA REPAIR  2001    There were no vitals filed for this visit.      Subjective Assessment - 05/16/16 1630    Subjective  "I am fine"   Patient is accompained by: Family member   Currently in Pain? Yes   Pain Score --  unable to score - hip and knee - left                      OT Treatments/Exercises (OP) - 05/16/16 0001      Neurological Re-education Exercises   Other Exercises 1 Patient with excessive activation in left shoulder leading to tight GH joint.  Patient assisted to supine with legs supported to provide trunk support and to enable  range of motion iolated to [facility].  Patient initially with report of pain if both elbow extended with shoulder flexion.  Patient did best with slow, prolonged stretch with emphasis on breathing to expand rib cage and to reduce overal muscle tension.  Patient did well with direct pressure at pects insertion to reduce muscle tension.  In sitting worked to improve general trunk mobility , especially flexion/rotation to iincrease GH joint mobility.  Body on arm motion helpful, although limited to left sdie due to hip  discomfort.  [name] indicates that patient has received one cortisone injection in left hip, and will receive another at her next physiatry appointment                OT Education - 05/16/16 1637    Education provided Yes   Education Details discussed with patient and daughter the importance of daily stretching to prevent tightness.  Daughter indicates that patient won't let her stretch due to pain.  Reinforced the importance to improve joint mobility, reduce pain   Person(s) Educated Patient;Child(ren)   Methods Explanation;Demonstration;Tactile cues;Verbal cues   Comprehension Verbalized understanding;Returned demonstration;Tactile cues required;Need further instruction          OT Short Term Goals - 04/27/16 1733      OT SHORT TERM GOAL #1   Title xxxx   Baseline xxxx   Time 0     OT SHORT TERM GOAL #2   Title xxxx   Baseline xxx   Time 0     OT SHORT TERM GOAL #3   Title xxxx   Baseline xxxx   Time 0     OT SHORT TERM GOAL #4   Title xxxx   Baseline xxxx   Time 0     OT SHORT TERM GOAL #5   Title xxxx  Baseline xxxx   Time 0           OT Long Term Goals - 05/16/16 1640      OT LONG TERM GOAL #1   Title Patient and caregiver will independently complete HEP  for LUE with pain report no greater than 1-2/10   Status On-going     OT LONG TERM GOAL #2   Title Patient will demonstrate 90 degrees of shoulder flexion, abduction without report of pain to aide in bathing and dressing    Status On-going     OT LONG TERM GOAL #3   Title Patient will grasp and release 1-3 inch object, with left hand, with minimal assistance,  as part of bimanual ADL tas   Status On-going               Plan - 05/16/16 1639    Clinical Impression Statement Patient with significant tension in left upper and lower extremity - needs updated stretching / exercise program for home.  Daughter concerned with recent change in mood, lethargy - plans to discuss  with Neurology.   Rehab Potential Fair   Clinical Impairments Affecting Rehab Potential limited financial resources   OT Frequency 2x / week   OT Duration 4 weeks   OT Treatment/Interventions Self-care/ADL training;Electrical Stimulation;Cryotherapy;Moist Heat;Visual/perceptual remediation/compensation;Patient/family education;Balance training;Splinting;Therapeutic exercises;Therapeutic activities;Neuromuscular education;Functional Mobility Training;Fluidtherapy;Biofeedback;DME and/or AE instruction;Cognitive remediation/compensation;Ultrasound   Plan Manual - left shoulder, elbow, hand - NMR LUE   OT Home Exercise Plan on going   Consulted and Agree with Plan of Care Patient;Family member/caregiver   Family Member Consulted Daughter, Raynelle FanningJulie      Patient will benefit from skilled therapeutic intervention in order to improve the following deficits and impairments:  Decreased activity tolerance, Decreased balance, Decreased coordination, Decreased endurance, Impaired flexibility, Impaired perceived functional ability, Decreased safety awareness, Decreased range of motion, Decreased mobility, Decreased knowledge of use of DME, Decreased knowledge of precautions, Decreased strength, Increased edema, Increased muscle spasms, Impaired sensation, Pain, Impaired tone, Impaired UE functional use, Impaired vision/preception, Decreased cognition  Visit Diagnosis: 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 Greater trochanteric bursitis of left hip 03/14/2016  . 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 Seizures (HCC) 06/21/2015  . Hemorrhage, intracerebral (HCC) 02/03/2015  . Hemiparesis (HCC) 02/03/2015  . Spastic hemiplegia affecting  nondominant side (HCC) 02/03/2015    Collier SalinaGellert, Kristin M 05/16/2016, 4:41 PM  [facility] [address] [place], [place], [address] Phone: [phone]   Fax:  [phone]  Name: Michelle Kemp MRN: [id number] Date of Birth: 05/21/1941

## 2016-05-18 ENCOUNTER — Ambulatory Visit: Payer: Self-pay | Admitting: Occupational Therapy

## 2016-05-18 ENCOUNTER — Ambulatory Visit: Payer: Self-pay | Admitting: Rehabilitation

## 2016-05-18 ENCOUNTER — Encounter: Payer: Self-pay | Admitting: Occupational Therapy

## 2016-05-18 DIAGNOSIS — G8929 Other chronic pain: Secondary | ICD-10-CM

## 2016-05-18 DIAGNOSIS — G8114 Spastic hemiplegia affecting left nondominant side: Secondary | ICD-10-CM

## 2016-05-18 DIAGNOSIS — R293 Abnormal posture: Secondary | ICD-10-CM

## 2016-05-18 DIAGNOSIS — R2681 Unsteadiness on feet: Secondary | ICD-10-CM

## 2016-05-18 DIAGNOSIS — M25612 Stiffness of left shoulder, not elsewhere classified: Secondary | ICD-10-CM

## 2016-05-18 DIAGNOSIS — M25512 Pain in left shoulder: Secondary | ICD-10-CM

## 2016-05-18 NOTE — Therapy (Signed)
[facility] [address] [place], [place], [address] Phone: [phone]   Fax:  [phone]  Occupational Therapy Treatment  Patient Details  Name: Michelle Kemp MRN: [id number] Date of Birth: 05/23/1941 Referring Provider: Dr Roda ShuttersXu  Encounter Date: 05/18/2016      OT End of Session - 05/18/16 1713    Visit Number 3   Number of Visits 9   Date for OT Re-Evaluation 06/08/16   Authorization Type self pay   OT Start Time 1445   OT Stop Time 1531   OT Time Calculation (min) 46 min   Equipment Utilized During Treatment ultrasound   Activity Tolerance Patient tolerated treatment well   Behavior During Therapy [facility] for tasks assessed/performed      Past Medical History:  Diagnosis Date  . Arthritis   . Seizures (HCC)   . Stroke [facility](HCC)     Past Surgical History:  Procedure Laterality Date  . HERNIA REPAIR  2001    There were no vitals filed for this visit.      Subjective Assessment - 05/18/16 1708    Subjective  "I am fine"   Currently in Pain? No/denies   Pain Score 0-No pain                      OT Treatments/Exercises (OP) - 05/18/16 0001      Neurological Re-education Exercises   Other Exercises 1 Working on isolated control of elbow flexion and extension.  Neuromuscular reeducation to address reduced tension in left shoulder with other body movement.       Modalities   Modalities Ultrasound     Ultrasound   Ultrasound Location Anterior shoulder - pectoralis insertion, bicep tendon   Ultrasound Parameters 1.270mhz, continuous, 0.8w/cm2, x 12 minutes,   Ultrasound Goals Pain;Other (Comment)  Range of motion     Manual Therapy   Manual Therapy Joint mobilization;Soft tissue mobilization;Scapular mobilization;Manual Traction   Manual therapy comments To increase joinyt capsule mobility and joint motion for shoulder flexion, abduction, external rotation                  OT  Short Term Goals - 04/27/16 1733      OT SHORT TERM GOAL #1   Title xxxx   Baseline xxxx   Time 0     OT SHORT TERM GOAL #2   Title xxxx   Baseline xxx   Time 0     OT SHORT TERM GOAL #3   Title xxxx   Baseline xxxx   Time 0     OT SHORT TERM GOAL #4   Title xxxx   Baseline xxxx   Time 0     OT SHORT TERM GOAL #5   Title xxxx   Baseline xxxx   Time 0           OT Long Term Goals - 05/16/16 1640      OT LONG TERM GOAL #1   Title Patient and caregiver will independently complete HEP  for LUE with pain report no greater than 1-2/10   Status On-going     OT LONG TERM GOAL #2   Title Patient will demonstrate 90 degrees of shoulder flexion, abduction without report of pain to aide in bathing and dressing    Status On-going     OT LONG TERM GOAL #3   Title Patient will grasp and release 1-3 inch object, with left hand, with minimal assistance,  as  part of bimanual ADL tas   Status On-going               Plan - 05/18/16 1714    Clinical Impression Statement Patient responded well to ultrasound and stretch to increase left shoulder/ elbow passive motion, and to reduce pain.    Rehab Potential Fair   Clinical Impairments Affecting Rehab Potential limited financial resources   OT Frequency 2x / week   OT Duration 4 weeks   OT Treatment/Interventions Self-care/ADL training;Electrical Stimulation;Cryotherapy;Moist Heat;Visual/perceptual remediation/compensation;Patient/family education;Balance training;Splinting;Therapeutic exercises;Therapeutic activities;Neuromuscular education;Functional Mobility Training;Fluidtherapy;Biofeedback;DME and/or AE instruction;Cognitive remediation/compensation;Ultrasound   Plan Manual left shoulder, elbow, hand, NMR - ESTIM L wrist/ hand   Consulted and Agree with Plan of Care Patient      Patient will benefit from skilled therapeutic intervention in order to improve the following deficits and impairments:  Decreased activity  tolerance, Decreased balance, Decreased coordination, Decreased endurance, Impaired flexibility, Impaired perceived functional ability, Decreased safety awareness, Decreased range of motion, Decreased mobility, Decreased knowledge of use of DME, Decreased knowledge of precautions, Decreased strength, Increased edema, Increased muscle spasms, Impaired sensation, Pain, Impaired tone, Impaired UE functional use, Impaired vision/preception, Decreased cognition  Visit Diagnosis: 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 Greater trochanteric bursitis of left hip 03/14/2016  . 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 Seizures (HCC) 06/21/2015  . Hemorrhage, intracerebral (HCC) 02/03/2015  . Hemiparesis (HCC) 02/03/2015  . Spastic hemiplegia affecting nondominant side (HCC) 02/03/2015    Collier SalinaGellert, Said Rueb M, OTR/L 05/18/2016, 5:16 PM   [facility] [address] [place], [place], [address] Phone: [phone]   Fax:  [phone]  Name: Michelle Kemp MRN: [id number] Date of Birth: 07/11/1940

## 2016-05-24 ENCOUNTER — Ambulatory Visit: Payer: Self-pay | Admitting: Occupational Therapy

## 2016-05-24 ENCOUNTER — Telehealth: Payer: Self-pay | Admitting: *Deleted

## 2016-05-24 ENCOUNTER — Encounter: Payer: Self-pay | Admitting: Occupational Therapy

## 2016-05-24 ENCOUNTER — Ambulatory Visit: Payer: Self-pay | Admitting: Physical Therapy

## 2016-05-24 DIAGNOSIS — R2681 Unsteadiness on feet: Secondary | ICD-10-CM

## 2016-05-24 DIAGNOSIS — G8114 Spastic hemiplegia affecting left nondominant side: Secondary | ICD-10-CM

## 2016-05-24 DIAGNOSIS — R2689 Other abnormalities of gait and mobility: Secondary | ICD-10-CM

## 2016-05-24 DIAGNOSIS — G8929 Other chronic pain: Secondary | ICD-10-CM

## 2016-05-24 DIAGNOSIS — R293 Abnormal posture: Secondary | ICD-10-CM

## 2016-05-24 DIAGNOSIS — M25612 Stiffness of left shoulder, not elsewhere classified: Secondary | ICD-10-CM

## 2016-05-24 DIAGNOSIS — M25512 Pain in left shoulder: Secondary | ICD-10-CM

## 2016-05-24 NOTE — Therapy (Signed)
[facility] [address] [place], [place], [address] Phone: [phone]   Fax:  [phone]  Physical Therapy Re-evaluation and Treatment  Patient Details  Name: Michelle Kemp MRN: [id number] Date of Birth: [birth year] Referring Provider: Marvel Plan, MD  Encounter Date: 05/24/2016      PT End of Session - 05/24/16 1491    Visit Number 17   Number of Visits 25  requesting 8 additional PT sessions   Date for PT Re-Evaluation 07/08/16   Authorization Type Self pay   PT Start Time 1453  Pt arrived late to session.   PT Stop Time 1530   PT Time Calculation (min) 37 min   Activity Tolerance Patient limited by fatigue   Behavior During Therapy [facility] for tasks assessed/performed      Past Medical History:  Diagnosis Date  . Arthritis   . Seizures (HCC)   . Stroke [facility])     Past Surgical History:  Procedure Laterality Date  . HERNIA REPAIR  2001    There were no vitals filed for this visit.      Subjective Assessment - 05/24/16 1459    Subjective Botox in LUE and LLE approx. 3 weeks ago. Daughter injured her hip trying to lift patient out of bed last week. Pt has not been consistently standing or waking for several weeks. Per daughter, pt has been more socially withdrawn and has had decreased appetite and reported feeling as though legs "weren't there," per daughter. Daughter feels like this is due to Keppra. Plans to speak with neurologist about dosage.   Patient is accompained by: Family member  dajghter, [name]   Pertinent History Seizures   Patient Stated Goals Per daughter, "I just want her to be able to wak to the kitchen and get herself something."    Per patient, "I want that metal frame [name])."   Currently in Pain? No/denies            [facility] PT Assessment - 05/24/16 0001      Assessment   Medical Diagnosis Spastic hemiplegia affecting nondominant side   Referring Provider Marvel Plan, MD   Onset Date/Surgical Date 11/07/14     Restrictions   Weight Bearing Restrictions No     Home Environment   Living Environment Private residence   Living Arrangements Children   Available Help at Discharge Family   Type of Home Apartment   Home Access Level entry   Home Layout One level   Home Equipment Bedside commode;Wheelchair - Proofreader - 2 wheels     Prior Function   Level of Independence Independent   Leisure cooking, watching movies, dancing     Sensation   Light Touch Appears Intact     Coordination   Gross Motor Movements are Fluid and Coordinated No     Posture/Postural Control   Posture/Postural Control Postural limitations   Posture Comments Standing: posterior rotation of trunk to L, limited B hip extension, L pelvic retraction, L hip ER.     Tone   Assessment Location Left Lower Extremity     ROM / Strength   AROM / PROM / Strength AROM;PROM;Strength     AROM   Overall AROM  Deficits   Overall AROM Comments No palpable muscle activation at L hip flexion or L knee/ankle in seated/NWB.     Strength   Overall Strength Deficits   Overall Strength Comments No palpable muscle activation at L hip flexion or L knee/ankle in seated/NWB.  Transfers   Transfers Sit to Stand;Stand to Sit   Sit to Stand 2: Max assist;1: +1 Total assist   Sit to Stand Details (indicate cue type and reason) From mat table with max A, manual stabilization at L knee, manual facilitation of full anterior weight shift. Using Stedy lift, pt required min guard-min A for full anterior weight shift.   Sit to Stand: Patient Percentage 40%   Stand to Sit 4: Min assist;Uncontrolled descent   Stand to Sit: Patient Percentage 80%     Ambulation/Gait   Ambulation/Gait No   Ambulation/Gait Assistance Details Attempted with B HHA of this PT and daughter; however, unable to initiate (appears to be due to pt fear of falling).     LUE Tone   LUE Tone Mild     LLE Tone   LLE  Tone Modified Ashworth     LLE Tone   Modified Ashworth Scale for Grading Hypertonia LLE Slight increase in muscle tone, manifested by a catch, followed by minimal resistance throughout the remainder (less than half) of the ROM  L quadriceps                             PT Education - 05/24/16 1924    Education provided Yes   Education Details PT goals. Explained benefits of Stedy: increased WB for spasticity management, decreased caregiver burden, to maintain functional progress on days when pt fatigued or has increased pain, for improved alignment, and for increased pt comfort (decreased fear of falling).    Person(s) Educated Patient;Child(ren)   Methods Demonstration;Verbal cues;Handout;Explanation  written information on how to purchase, if family decides to do so   Comprehension Verbalized understanding;Need further instruction          PT Short Term Goals - 05/24/16 1951      PT SHORT TERM GOAL #1   Title STG's = LTG's           PT Long Term Goals - 05/24/16 1951      PT LONG TERM GOAL #1   Title Daughter will perform home stretching program independently to maximize effectiveness of botox injections, manage spasticity.  (Target:  06/21/16)   Time 4   Period Weeks   Status New     PT LONG TERM GOAL #2   Title Pt will perform sit > stand from personal w/c with total A of Stedy with no physical assistance from PT or caregiver to indicate decreased caregiver burden with functional transfers.  (06/21/16)   Status New     PT LONG TERM GOAL #3   Title Further goals to be written to address bed mobility and standing posture, after pt further assessed.  (06/21/16)   Time 4   Period Weeks   Status New               Plan - 05/24/16 1942    Clinical Impression Statement PT is a 75 y/o F well-known to this PT who returns to outpatient neuro after receiving botox injections in L rectus femoris., VMO, and vastus lateralis to address spasticity  associated with CVA sustained 10/28/14. PT re-evaluation reveals the following impairments: abnormal postural alignment; decreased independence with all aspects of functional mobility; spasticity in L hip flexors/knee extensors; decreased tolerance to standing/ambulation due to fear of falling, pain, and fatigue; and increased caregiver burden. Pt will benefit from skilled outpatient PT 2x/week for 4 weeks to address said impairments.  Rehab Potential Fair   Clinical Impairments Affecting Rehab Potential patient self pay for services; time since CVA   PT Frequency 2x / week   PT Duration 4 weeks   PT Treatment/Interventions Gait training;Stair training;Neuromuscular re-education;Balance training;Therapeutic exercise;Therapeutic activities;Functional mobility training;Patient/family education;Wheelchair mobility training;Manual techniques;Orthotic Fit/Training;ADLs/Self Care Home Management;DME Instruction   PT Next Visit Plan Have conversation with pt/daughter about walking, bed mobility. Initiate home stretching program (L rectus, VMO).    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 Greater trochanteric bursitis of left hip 03/14/2016  . 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 Seizures (HCC) 06/21/2015  . Hemorrhage, intracerebral (HCC) 02/03/2015  . Hemiparesis (HCC) 02/03/2015  . Spastic hemiplegia affecting nondominant side (HCC) 02/03/2015    Jorje GuildBlair Kandace Elrod, PT, DPT [facility] [address] [place], [place], [address] Phone: [phone]   Fax:  [phone] 05/24/16, 7:58 PM  Name: Michelle Kemp MRN: [id number] Date of Birth: 01/01/1941

## 2016-05-24 NOTE — Telephone Encounter (Signed)
Unable to leave message on cell number. VM was not activated.

## 2016-05-24 NOTE — Patient Instructions (Addendum)
     Position (A) Helper: Hold left arm close to side of trunk. Motion (B) - Lift arm over head in line with trunk, palm turned inward. CAUTION: Do not push into shoulder joint. Do not force movement if painful. Repeat 5 times. Do at least 2 sessions per day. Patient is passive.   Copyright  VHI. All rights reserved.  Abduction: ROM (Supine)    Position (A) Helper: Hold left arm at elbow and wrist. Elbow straight. Motion (B) -Glide arm out to side. -Do not allow arm to go beyond shoulder level. CAUTION: Do not push into shoulder joint. Repeat 5 times.  Do at least 2 sessions per day. Patient is passive.   Copyright  VHI. All rights reserved.  Extension: ROM    Position (A) Helper: Stabilize left hand near wrist. Support fingers at tips. Motion (B) -Straighten fingers fully. -Helper assists by guiding all fingers straight. CAUTION: Do not force movement. Repeat 5 times.  Do at least 2 sessions per day. Var Perform with wrist flexed toward palm. Patient is passive.   Copyright  VHI. All rights reserved.

## 2016-05-24 NOTE — Telephone Encounter (Signed)
LFt vm on patients daughter home phone. Rn requested pts daughter to send my chart message or any medication dosage or concerns. Also to call the office or at appt. Rn left number for daughter to call back. Pts daughter stated in message that the pharmacist thinks pts keppra needs to be reduce.

## 2016-05-25 NOTE — Therapy (Signed)
[place] [address] [place], [place], [address] Phone: [phone]   Fax:  [phone]  Occupational Therapy Treatment  Patient Details  Name: Michelle Kemp MRN: [id number] Date of Birth: [birth year] Referring Provider: Dr Erlinda Hong  Encounter Date: 05/24/2016      OT End of Session - 05/25/16 1105    Visit Number 4   Number of Visits 9   Date for OT Re-Evaluation 06/08/16   Authorization Type self pay   OT Start Time 1535   OT Stop Time 1615   OT Time Calculation (min) 40 min   Activity Tolerance Patient tolerated treatment well   Behavior During Therapy [facility] for tasks assessed/performed      Past Medical History:  Diagnosis Date  . Arthritis   . Seizures ([place])   . Stroke [facility])     Past Surgical History:  Procedure Laterality Date  . HERNIA REPAIR  2001    There were no vitals filed for this visit.      Subjective Assessment - 05/24/16 1635    Subjective  I am fine   Patient is accompained by: Family member   Currently in Pain? No/denies   Pain Score 0-No pain                      OT Treatments/Exercises (OP) - 05/25/16 0001      Neurological Re-education Exercises   Other Exercises 1 Performed passive range of motion to shoulder and elbow - reveiwed HEP with daughter [name].  Worked to further isolate elbow flexion  and extension with left humerus in line of sight while patient supine.  Patient with ~ 110 degrees of passive shoulder flexion today without report of discomfort.  Patient indicates shoulder ahs felt much better since last session.                  OT Education - 05/25/16 1104    Education provided Yes   Education Details Reviewed and reinforced HEP to prevent stiffness and pain in left shoulder and hand   Person(s) Educated Patient;Child(ren)   Methods Explanation;Demonstration;Tactile cues;Verbal cues   Comprehension Verbalized understanding;Returned  demonstration          OT Short Term Goals - 04/27/16 1733      OT SHORT TERM GOAL #1   Title xxxx   Baseline xxxx   Time 0     OT SHORT TERM GOAL #2   Title xxxx   Baseline xxx   Time 0     OT SHORT TERM GOAL #3   Title xxxx   Baseline xxxx   Time 0     OT SHORT TERM GOAL #4   Title xxxx   Baseline xxxx   Time 0     OT SHORT TERM GOAL #5   Title xxxx   Baseline xxxx   Time 0           OT Long Term Goals - 05/25/16 1111      OT LONG TERM GOAL #1   Title Patient and caregiver will independently complete HEP  for LUE with pain report no greater than 1-2/10   Status On-going     OT LONG TERM GOAL #2   Title Patient will demonstrate 90 degrees of shoulder flexion, abduction without report of pain to aide in bathing and dressing    Status Achieved     OT LONG TERM GOAL #3   Title Patient will grasp and  release 1-3 inch object, with left hand, with minimal assistance,  as part of bimanual ADL tas   Status On-going               Plan - 05/25/16 1109    Clinical Impression Statement Patient has maintained improved passive shoulder motion, and has met one long term goal.  Patient reports less pain in left shoulder with passive motion eg during bathing and dressing.     Rehab Potential Fair   Clinical Impairments Affecting Rehab Potential limited financial resources   OT Frequency 2x / week   OT Duration 4 weeks   OT Treatment/Interventions Self-care/ADL training;Electrical Stimulation;Cryotherapy;Moist Heat;Visual/perceptual remediation/compensation;Patient/family education;Balance training;Splinting;Therapeutic exercises;Therapeutic activities;Neuromuscular education;Functional Mobility Training;Fluidtherapy;Biofeedback;DME and/or AE instruction;Cognitive remediation/compensation;Ultrasound   Plan Review stretching program with daughter [name] if able, estim left wrist / digits / extension, NMR LUE   OT Home Exercise Plan on going   Consulted and Agree  with Plan of Care Patient;Family member/caregiver   Family Member Consulted Daughter, [name]      Patient will benefit from skilled therapeutic intervention in order to improve the following deficits and impairments:  Decreased activity tolerance, Decreased balance, Decreased coordination, Decreased endurance, Impaired flexibility, Impaired perceived functional ability, Decreased safety awareness, Decreased range of motion, Decreased mobility, Decreased knowledge of use of DME, Decreased knowledge of precautions, Decreased strength, Increased edema, Increased muscle spasms, Impaired sensation, Pain, Impaired tone, Impaired UE functional use, Impaired vision/preception, Decreased cognition  Visit Diagnosis: 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 Greater trochanteric bursitis of left hip 03/14/2016  . Nontraumatic subcortical hemorrhage of right cerebral hemisphere ([place]) 09/15/2015  . Seizures ([place]) 06/21/2015  . Hemorrhage, intracerebral ([place]) 02/03/2015  . Hemiparesis ([place]) 02/03/2015  . Spastic hemiplegia affecting nondominant side ([place]) 02/03/2015    Mariah Milling 05/25/2016, 11:12 AM  [place] [address] [place], [place], [address] Phone: [phone]   Fax:  [phone]  Name: Zakara Parkey MRN: [id number] Date of Birth: Jan 28, 1941

## 2016-05-26 ENCOUNTER — Encounter: Payer: Self-pay | Admitting: Occupational Therapy

## 2016-05-26 ENCOUNTER — Ambulatory Visit: Payer: Self-pay | Admitting: Physical Therapy

## 2016-05-26 ENCOUNTER — Ambulatory Visit: Payer: Self-pay | Admitting: Occupational Therapy

## 2016-05-26 DIAGNOSIS — G8114 Spastic hemiplegia affecting left nondominant side: Secondary | ICD-10-CM

## 2016-05-26 DIAGNOSIS — R2681 Unsteadiness on feet: Secondary | ICD-10-CM

## 2016-05-26 DIAGNOSIS — G8929 Other chronic pain: Secondary | ICD-10-CM

## 2016-05-26 DIAGNOSIS — R293 Abnormal posture: Secondary | ICD-10-CM

## 2016-05-26 DIAGNOSIS — M25512 Pain in left shoulder: Secondary | ICD-10-CM

## 2016-05-26 DIAGNOSIS — M25612 Stiffness of left shoulder, not elsewhere classified: Secondary | ICD-10-CM

## 2016-05-26 NOTE — Therapy (Signed)
[facility] [address] [place], [place], [address] Phone: [phone]   Fax:  [phone]  Occupational Therapy Treatment  Patient Details  Name: Michelle Kemp MRN: [id number] Date of Birth: 01/22/1941 Referring Provider: Dr Roda ShuttersXu  Encounter Date: 05/26/2016      OT End of Session - 05/26/16 1647    Visit Number 5   Number of Visits 9   Date for OT Re-Evaluation 06/08/16   Authorization Type self pay   OT Start Time 1455   OT Stop Time 1535   OT Time Calculation (min) 40 min   Equipment Utilized During Treatment NMES   Activity Tolerance Patient tolerated treatment well   Behavior During Therapy [facility] for tasks assessed/performed      Past Medical History:  Diagnosis Date  . Arthritis   . Seizures (HCC)   . Stroke [facility](HCC)     Past Surgical History:  Procedure Laterality Date  . HERNIA REPAIR  2001    There were no vitals filed for this visit.      Subjective Assessment - 05/26/16 1645    Subjective  I am fine   Currently in Pain? No/denies   Pain Score 0-No pain                      OT Treatments/Exercises (OP) - 05/26/16 0001      Neurological Re-education Exercises   Other Exercises 1 Worked on active grasp / release of small objects, - with emphasis on release.  Patient did best with simple activity and MULTIPLE repetitions for best result.  By end of session, patient bale to more quickly release objects effectively with forearm supported and wrist in passive flexion     Electrical Stimulation   Electrical Stimulation Location left forearm - dorsal   Electrical Stimulation Action NMES for wrist  and digit extension, limited MCP extension in third digit despite multiple electrode placements                OT Education - 05/25/16 1104    Education provided Yes   Education Details Reviewed and reinforced HEP to prevent stiffness and pain in left shoulder and hand   Person(s) Educated Patient;Child(ren)   Methods Explanation;Demonstration;Tactile cues;Verbal cues   Comprehension Verbalized understanding;Returned demonstration          OT Short Term Goals - 04/27/16 1733      OT SHORT TERM GOAL #1   Title xxxx   Baseline xxxx   Time 0     OT SHORT TERM GOAL #2   Title xxxx   Baseline xxx   Time 0     OT SHORT TERM GOAL #3   Title xxxx   Baseline xxxx   Time 0     OT SHORT TERM GOAL #4   Title xxxx   Baseline xxxx   Time 0     OT SHORT TERM GOAL #5   Title xxxx   Baseline xxxx   Time 0           OT Long Term Goals - 05/25/16 1111      OT LONG TERM GOAL #1   Title Patient and caregiver will independently complete HEP  for LUE with pain report no greater than 1-2/10   Status On-going     OT LONG TERM GOAL #2   Title Patient will demonstrate 90 degrees of shoulder flexion, abduction without report of pain to aide in bathing and dressing  Status Achieved     OT LONG TERM GOAL #3   Title Patient will grasp and release 1-3 inch object, with left hand, with minimal assistance,  as part of bimanual ADL tas   Status On-going               Plan - 05/26/16 1648    Clinical Impression Statement Patient is showing more consistent range of motion, and decreased tightness in hand and shoulder.     Rehab Potential Fair   Clinical Impairments Affecting Rehab Potential limited financial resources   OT Frequency 2x / week   OT Duration 4 weeks   OT Treatment/Interventions Self-care/ADL training;Electrical Stimulation;Cryotherapy;Moist Heat;Visual/perceptual remediation/compensation;Patient/family education;Balance training;Splinting;Therapeutic exercises;Therapeutic activities;Neuromuscular education;Functional Mobility Training;Fluidtherapy;Biofeedback;DME and/or AE instruction;Cognitive remediation/compensation;Ultrasound   Plan Grasp / release   OT Home Exercise Plan on going   Consulted and Agree with Plan of Care  Patient;Family member/caregiver      Patient will benefit from skilled therapeutic intervention in order to improve the following deficits and impairments:  Decreased activity tolerance, Decreased balance, Decreased coordination, Decreased endurance, Impaired flexibility, Impaired perceived functional ability, Decreased safety awareness, Decreased range of motion, Decreased mobility, Decreased knowledge of use of DME, Decreased knowledge of precautions, Decreased strength, Increased edema, Increased muscle spasms, Impaired sensation, Pain, Impaired tone, Impaired UE functional use, Impaired vision/preception, Decreased cognition  Visit Diagnosis: 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 Greater trochanteric bursitis of left hip 03/14/2016  . 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 Seizures (HCC) 06/21/2015  . Hemorrhage, intracerebral (HCC) 02/03/2015  . Hemiparesis (HCC) 02/03/2015  . Spastic hemiplegia affecting nondominant side (HCC) 02/03/2015    Collier SalinaGellert, Vanderbilt Ranieri M 05/26/2016, 4:50 PM  [facility] [address] [place], [place], [address] Phone: [phone]   Fax:  [phone]  Name: Michelle Kemp MRN: [id number] Date of Birth: 10/15/1940

## 2016-05-27 NOTE — Therapy (Signed)
[facility] [address] [place], [place], [address] Phone: [phone]   Fax:  [phone]  Physical Therapy Treatment  Patient Details  Name: Michelle Kemp MRN: [id number] Date of Birth: 11/04/1940 Referring Provider: Marvel PlanJindong Xu, MD  Encounter Date: 05/26/2016      PT End of Session - 05/27/16 1035    Visit Number 18   Number of Visits 25   Date for PT Re-Evaluation 07/08/16   Authorization Type Self pay   PT Start Time 1532   PT Stop Time 1614   PT Time Calculation (min) 42 min   Activity Tolerance Patient tolerated treatment well   Behavior During Therapy [facility] for tasks assessed/performed      Past Medical History:  Diagnosis Date  . Arthritis   . Seizures (HCC)   . Stroke [facility](HCC)     Past Surgical History:  Procedure Laterality Date  . HERNIA REPAIR  2001    There were no vitals filed for this visit.      Subjective Assessment - 05/27/16 1033    Subjective Pt arrived to session via public transportation today. Family member not present.   Pertinent History Seizures   Patient Stated Goals Per daughter, "I just want her to be able to wak to the kitchen and get herself something."    Per patient, "I want that metal frame [name]."   Currently in Pain? No/denies  No overt signs of pain      NMR: Sit > stand from personal manual w/c using Stedy with mod A, 75% cueing for hand placement; manual placement of L foot on foot plate; manual facilitation of anterior weight shift; stand > sit to w/c with Stedy with min A for full anterior weight shift, for controlled descent; manual placement of L hand (removal from bar) for LUE joint protection. Blocked practice of sit <> stand from mat table (neutral height) using Stedy with increased time and min A for sit > stand (assist only for initial 25% of transfer); min A and manual facilitation of anterior weight shift with stand > sit. Use of Stedy lift facilitated LE  WB for increased L trunk/LLE activation, for spasticity management, and for postural normalization. When standing with total A of Stedy, noted improved postural alignment (less prominent L trunk rotation), especially when pt was able to manually grasp anterior bar with L hand.   Performed sit <> supine on L side x1 trial for increased LUE activation, for improved improved kinesthetic awareness on L side, for active trunk shortening with LUE in WB position. For sit > supine to L, pt required total A to control descent (attempted to transition from short sitting > L forearm propping > L sidelying; however, pt with increased difficulty with L forearm propping, so transitioned from short sitting > supine) with total A to control descent at trunk and for BLE management. Pt required max A for supine > sit to L; max multimodal cueing provided for R knee flexion, movement of RUE across midline, for transition from supine > L sidelying; assist for BLE management. Performed supine <> sit to R side x3 consecutive reps. With sit > supine, pt progressed from requiring mod A to requiring min A; pt required min A for all trials of supine > sit, but progressed from 75% cueing to 25% cueing on last trial. While in supine, PT performed manual rhythmic rotation at B hips to decrease rigidity in trunk/pelvis. Progressed to supine lower trunk rotation x20 reps for  increased trunk dissociation; pt required mod A to perform. Supine, hook lying with LLE extended off EOM, PT assisted with manual passive stretch of L iliopsoas, L rectus femoris 45 seconds x2 for spasticity management.                            PT Short Term Goals - 05/24/16 1951      PT SHORT TERM GOAL #1   Title STG's = LTG's           PT Long Term Goals - 05/27/16 1037      PT LONG TERM GOAL #1   Title Daughter will perform home stretching program independently to maximize effectiveness of botox injections, manage spasticity.   (Target:  06/21/16)   Time 4   Period Weeks   Status On-going     PT LONG TERM GOAL #2   Title Pt will perform sit > stand from personal w/c with total A of Stedy with no physical assistance from PT or caregiver to indicate decreased caregiver burden with functional transfers.  (06/21/16)   Status On-going     PT LONG TERM GOAL #3   Title Further goals to be written to address bed mobility and standing posture, after pt further assessed.  (06/21/16)   Time 4   Period Weeks   Status On-going     PT LONG TERM GOAL #4   Title Pt will consistently perform supine <> sit with supervision/setup, 25% cueing to decrease caregiver burden with bed mobility.  (06/21/16)   Baseline 11/17: supine > sit with max A to L, min A to R; sit > supine with  total A to L, mod A to R.   Time 4   Period Weeks   Status New               Plan - 05/27/16 1035    Clinical Impression Statement Skilled session focused on assessment of bed mobility; LLE stretching for spasticity management; and use of Stedy for WB/LE activation a and improved postural alignment/control. A LTG added for bed mobility.   Rehab Potential Fair   Clinical Impairments Affecting Rehab Potential patient self pay for services; time since CVA   PT Frequency 2x / week   PT Duration 4 weeks   PT Treatment/Interventions Gait training;Stair training;Neuromuscular re-education;Balance training;Therapeutic exercise;Therapeutic activities;Functional mobility training;Patient/family education;Wheelchair mobility training;Manual techniques;Orthotic Fit/Training;ADLs/Self Care Home Management;DME Instruction   PT Next Visit Plan Have conversation with pt/daughter about walking, bed mobility. Initiate home stretching program (L rectus, VMO).    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 Greater trochanteric bursitis of left hip 03/14/2016  . 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 Seizures (HCC) 06/21/2015  . Hemorrhage, intracerebral (HCC) 02/03/2015  . Hemiparesis (HCC) 02/03/2015  . Spastic hemiplegia affecting nondominant side (HCC) 02/03/2015    Jorje GuildBlair Bernardette Waldron, PT, DPT [facility] [address]  [place], [place], [address] Phone: [phone]   Fax:  [phone] 05/27/16, 10:42 AM  Name: Lichelle Viets MRN: [id number] Date of Birth: 09-Nov-1940

## 2016-05-30 ENCOUNTER — Encounter: Payer: Self-pay | Admitting: Occupational Therapy

## 2016-05-30 ENCOUNTER — Ambulatory Visit: Payer: Self-pay | Admitting: Physical Therapy

## 2016-05-30 ENCOUNTER — Ambulatory Visit: Payer: Self-pay | Admitting: Occupational Therapy

## 2016-05-30 VITALS — BP 154/110 | HR 80

## 2016-05-30 VITALS — BP 146/108 | HR 90

## 2016-05-30 NOTE — Therapy (Signed)
[facility] [address] [place], [place], [address] Phone: [phone]   Fax:  [phone]  Occupational Therapy Treatment  Patient Details  Name: Michelle Kemp MRN: [id number] Date of Birth: 08/10/1940 Referring Provider: Dr Roda ShuttersXu  Encounter Date: 05/30/2016      OT End of Session - 05/30/16 1509    Visit Number 5   Number of Visits 9   Date for OT Re-Evaluation 06/08/16   Authorization Type self pay   OT Start Time 1445   OT Stop Time 1501   OT Time Calculation (min) 16 min   Equipment Utilized During Treatment monitoring vitals      Past Medical History:  Diagnosis Date  . Arthritis   . Seizures (HCC)   . Stroke [facility](HCC)     Past Surgical History:  Procedure Laterality Date  . HERNIA REPAIR  2001    Vitals:   05/30/16 1457 05/30/16 1458  BP: (!) 142/106 (!) 154/110  Pulse: 82 80  SpO2: 98%                                 OT Short Term Goals - 04/27/16 1733      OT SHORT TERM GOAL #1   Title xxxx   Baseline xxxx   Time 0     OT SHORT TERM GOAL #2   Title xxxx   Baseline xxx   Time 0     OT SHORT TERM GOAL #3   Title xxxx   Baseline xxxx   Time 0     OT SHORT TERM GOAL #4   Title xxxx   Baseline xxxx   Time 0     OT SHORT TERM GOAL #5   Title xxxx   Baseline xxxx   Time 0           OT Long Term Goals - 05/25/16 1111      OT LONG TERM GOAL #1   Title Patient and caregiver will independently complete HEP  for LUE with pain report no greater than 1-2/10   Status On-going     OT LONG TERM GOAL #2   Title Patient will demonstrate 90 degrees of shoulder flexion, abduction without report of pain to aide in bathing and dressing    Status Achieved     OT LONG TERM GOAL #3   Title Patient will grasp and release 1-3 inch object, with left hand, with minimal assistance,  as part of bimanual ADL tas   Status On-going               Plan - 05/30/16  1510    Clinical Impression Statement Patient not seen today due to HTN   Rehab Potential Fair   Clinical Impairments Affecting Rehab Potential limited financial resources   OT Frequency 2x / week   OT Duration 4 weeks   OT Treatment/Interventions Self-care/ADL training;Electrical Stimulation;Cryotherapy;Moist Heat;Visual/perceptual remediation/compensation;Patient/family education;Balance training;Splinting;Therapeutic exercises;Therapeutic activities;Neuromuscular education;Functional Mobility Training;Fluidtherapy;Biofeedback;DME and/or AE instruction;Cognitive remediation/compensation;Ultrasound   Plan GRASP / RELEASE   OT Home Exercise Plan on going      Patient will benefit from skilled therapeutic intervention in order to improve the following deficits and impairments:  Decreased activity tolerance, Decreased balance, Decreased coordination, Decreased endurance, Impaired flexibility, Impaired perceived functional ability, Decreased safety awareness, Decreased range of motion, Decreased mobility, Decreased knowledge of use of DME, Decreased knowledge of precautions, Decreased strength, Increased edema, Increased muscle  spasms, Impaired sensation, Pain, Impaired tone, Impaired UE functional use, Impaired vision/preception, Decreased cognition  Visit Diagnosis: Abnormal posture  Nontraumatic subcortical hemorrhage of right cerebral hemisphere [facility](HCC)  Spastic hemiplegia affecting nondominant side (HCC)  Unsteadiness on feet  Stiffness of shoulder joint, left  Chronic left shoulder pain    Problem List Patient Active Problem List   Diagnosis Date Noted  . Greater trochanteric bursitis of left hip 03/14/2016  . 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 Seizures (HCC) 06/21/2015  . Hemorrhage, intracerebral (HCC) 02/03/2015  . Hemiparesis (HCC) 02/03/2015  . Spastic hemiplegia affecting nondominant side (HCC) 02/03/2015    Collier SalinaGellert,  Kristin M, OTR/L 05/30/2016, 3:14 PM  [facility] [address] [place], [place], [address] Phone: [phone]   Fax:  [phone]  Name: Michelle Abedkuwavi Dragon MRN: [id number] Date of Birth: 07/06/1941

## 2016-05-30 NOTE — Therapy (Signed)
[facility] [address] [place], [place], [address] Phone: [phone]   Fax:  [phone]  Physical Therapy Treatment  Patient Details  Name: Michelle Kemp Cuny MRN: [id number] Date of Birth: 06/02/1941 Referring Provider: Marvel PlanJindong Xu, MD  Encounter Date: 05/30/2016      PT End of Session - 05/30/16 1445    Visit Number 19   Number of Visits 25   Date for PT Re-Evaluation 07/08/16   Authorization Type Self pay   PT Start Time 1400   PT Stop Time 1415   PT Time Calculation (min) 15 min   Activity Tolerance Other (comment)  Limited by elevaed BP   Behavior During Therapy [facility] for tasks assessed/performed      Past Medical History:  Diagnosis Date  . Arthritis   . Seizures (HCC)   . Stroke [facility](HCC)     Past Surgical History:  Procedure Laterality Date  . HERNIA REPAIR  2001    Vitals:   05/30/16 0215 05/30/16 1430  BP: (!) 149/110 (!) 146/108  Pulse: 90   SpO2: 98%         Subjective Assessment - 05/30/16 1445    Subjective No family present today, as patient arrived via public transportation.    Pertinent History Seizures   Patient Stated Goals Per daughter, "I just want her to be able to wak to the kitchen and get herself something."    Per patient, "I want that metal frame [name]."                                   PT Short Term Goals - 05/24/16 1951      PT SHORT TERM GOAL #1   Title STG's = LTG's           PT Long Term Goals - 05/27/16 1037      PT LONG TERM GOAL #1   Title Daughter will perform home stretching program independently to maximize effectiveness of botox injections, manage spasticity.  (Target:  06/21/16)   Time 4   Period Weeks   Status On-going     PT LONG TERM GOAL #2   Title Pt will perform sit > stand from personal w/c with total A of Stedy with no physical assistance from PT or caregiver to indicate decreased caregiver burden with  functional transfers.  (06/21/16)   Status On-going     PT LONG TERM GOAL #3   Title Further goals to be written to address bed mobility and standing posture, after pt further assessed.  (06/21/16)   Time 4   Period Weeks   Status On-going     PT LONG TERM GOAL #4   Title Pt will consistently perform supine <> sit with supervision/setup, 25% cueing to decrease caregiver burden with bed mobility.  (06/21/16)   Baseline 11/17: supine > sit with max A to L, min A to R; sit > supine with  total A to L, mod A to R.   Time 4   Period Weeks   Status New               Plan - 05/30/16 1445    Clinical Impression Statement Upon getting pt into supine position (utilized Stedy to transfer from w/c > mat table) then transitioned from supine > sit. Pt with increased labored breathing and gesturing hand on chest. PT positioned pt into seated and labored breathing  resolved shortly thereafter. Vital signs: BP 149/110, HR 90, SpO2 98%. Pt in no apparent distress but unable to communicate due to no family present. [employer] personnel notified daughter, Raynelle FanningJulie, via phone. Daughter on her way to clinic. Pt left seated in w/c with primary OT present. Pt in no apparent distress. BP 146/108.   Rehab Potential Fair   Clinical Impairments Affecting Rehab Potential patient self pay for services; time since CVA   PT Frequency 2x / week   PT Duration 4 weeks   PT Treatment/Interventions Gait training;Stair training;Neuromuscular re-education;Balance training;Therapeutic exercise;Therapeutic activities;Functional mobility training;Patient/family education;Wheelchair mobility training;Manual techniques;Orthotic Fit/Training;ADLs/Self Care Home Management;DME Instruction   PT Next Visit Plan Initiate home stretching program (L rectus, VMO).    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 Greater trochanteric bursitis of left hip 03/14/2016  . 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 Seizures (HCC) 06/21/2015  . Hemorrhage, intracerebral (HCC) 02/03/2015  . Hemiparesis (HCC) 02/03/2015  . Spastic hemiplegia affecting nondominant side (HCC) 02/03/2015   Jorje GuildBlair Dymin Dingledine, PT, DPT [facility] [address] [place], [place], [address] Phone: [phone]   Fax:  [phone] 05/30/16, 2:51 PM  Name: Michelle Kemp Michelle Kemp MRN: [id number] Date of Birth: 12/03/1940

## 2016-06-07 ENCOUNTER — Encounter: Payer: Self-pay | Admitting: Physical Therapy

## 2016-06-07 ENCOUNTER — Ambulatory Visit: Payer: Self-pay | Admitting: Physical Therapy

## 2016-06-07 ENCOUNTER — Ambulatory Visit: Payer: Self-pay | Admitting: Occupational Therapy

## 2016-06-07 DIAGNOSIS — R2681 Unsteadiness on feet: Secondary | ICD-10-CM

## 2016-06-07 DIAGNOSIS — M25612 Stiffness of left shoulder, not elsewhere classified: Secondary | ICD-10-CM

## 2016-06-07 DIAGNOSIS — R293 Abnormal posture: Secondary | ICD-10-CM

## 2016-06-07 DIAGNOSIS — M25512 Pain in left shoulder: Secondary | ICD-10-CM

## 2016-06-07 DIAGNOSIS — R2689 Other abnormalities of gait and mobility: Secondary | ICD-10-CM

## 2016-06-07 DIAGNOSIS — G8114 Spastic hemiplegia affecting left nondominant side: Secondary | ICD-10-CM

## 2016-06-07 DIAGNOSIS — G8929 Other chronic pain: Secondary | ICD-10-CM

## 2016-06-07 DIAGNOSIS — I610 Nontraumatic intracerebral hemorrhage in hemisphere, subcortical: Secondary | ICD-10-CM

## 2016-06-07 NOTE — Patient Instructions (Addendum)
Hip Flexor Stretch    Lying on back near edge of bed, bend one leg, foot flat. Hang other leg over edge, relaxed, thigh resting entirely on bed for __2__ minutes. Repeat __2-3__ times each leg, the left leg needs this most if only have time to do this on one leg.  Do _1-2_ sessions per day. Advanced Exercise: Bend knee back keeping thigh in contact with bed.  http://gt2.exer.us/347   Copyright  VHI. All rights reserved.

## 2016-06-08 ENCOUNTER — Encounter: Payer: Self-pay | Admitting: Occupational Therapy

## 2016-06-08 ENCOUNTER — Ambulatory Visit: Payer: Self-pay | Admitting: Physical Therapy

## 2016-06-08 NOTE — Therapy (Signed)
[facility] [address] [place], [place], [address] Phone: [phone]   Fax:  [phone]  Occupational Therapy Treatment  Patient Details  Name: Michelle Kemp MRN: [id number] Date of Birth: 02/07/1941 Referring Provider: Dr Roda ShuttersXu  Encounter Date: 06/07/2016      OT End of Session - 06/08/16 1015    Visit Number 6   Number of Visits 9   Date for OT Re-Evaluation 07/09/16   Authorization Type self pay   OT Start Time 1445   OT Stop Time 1529   OT Time Calculation (min) 44 min   Activity Tolerance Patient tolerated treatment well   Behavior During Therapy [facility] for tasks assessed/performed      Past Medical History:  Diagnosis Date  . Arthritis   . Seizures (HCC)   . Stroke [facility](HCC)     Past Surgical History:  Procedure Laterality Date  . HERNIA REPAIR  2001    There were no vitals filed for this visit.      Subjective Assessment - 06/08/16 1001    Subjective  I am fine   Patient is accompained by: --  Used interpreter line to facilitate discussion regarding remaining OT visits.  Family not present   Currently in Pain? Yes   Pain Location Hip   Pain Orientation Left   Pain Descriptors / Indicators Aching   Pain Type Chronic pain   Pain Onset More than a month ago   Pain Frequency Intermittent   Aggravating Factors  unsure   Pain Relieving Factors rest   Effect of Pain on Daily Activities limits stand tolerance                      OT Treatments/Exercises (OP) - 06/08/16 0001      Neurological Re-education Exercises   Other Exercises 1 Worked with and without electrical stimulation to facilitate extension in left digits, and wrist.  Noticing some ability to activate wrist extension immediately following estim - however, not lasting result.  Patient not yet able to extend digits outside of estim session.  Patient with noteable decrease in tension, and increase in joint range of motion  since beginning estim to hand.  Patient with limited active extension in 3rd digit (long finger) - although passive range is functional.      Other Exercises 2 Worked on sit to stand and stand balance.  Patient able to transition to standing with min assistance - with emphasis on foot placement and forward weight shift.  In standing worked to decrease tension in shoulder and elbow with gross motor challenge.       Programme researcher, broadcasting/film/videolectrical Stimulation   Electrical Stimulation Location left forearm   Electrical Stimulation Action NMES for wrist / digit extension.  x 15 minutes                  OT Short Term Goals - 04/27/16 1733      OT SHORT TERM GOAL #1   Title xxxx   Baseline xxxx   Time 0     OT SHORT TERM GOAL #2   Title xxxx   Baseline xxx   Time 0     OT SHORT TERM GOAL #3   Title xxxx   Baseline xxxx   Time 0     OT SHORT TERM GOAL #4   Title xxxx   Baseline xxxx   Time 0     OT SHORT TERM GOAL #5   Title xxxx  Baseline xxxx   Time 0           OT Long Term Goals - 05/25/16 1111      OT LONG TERM GOAL #1   Title Patient and caregiver will independently complete HEP  for LUE with pain report no greater than 1-2/10   Status On-going     OT LONG TERM GOAL #2   Title Patient will demonstrate 90 degrees of shoulder flexion, abduction without report of pain to aide in bathing and dressing    Status Achieved     OT LONG TERM GOAL #3   Title Patient will grasp and release 1-3 inch object, with left hand, with minimal assistance,  as part of bimanual ADL tas   Status On-going               Plan - 06/08/16 1019    Clinical Impression Statement Patient is showing improved passive and active movement in distal left upper extremity, yet this movement is not improving function in this extremity.     Rehab Potential Fair   Clinical Impairments Affecting Rehab Potential limited financial resources   OT Frequency 2x / week   OT Duration 4 weeks   OT  Treatment/Interventions Self-care/ADL training;Electrical Stimulation;Cryotherapy;Moist Heat;Visual/perceptual remediation/compensation;Patient/family education;Balance training;Splinting;Therapeutic exercises;Therapeutic activities;Neuromuscular education;Functional Mobility Training;Fluidtherapy;Biofeedback;DME and/or AE instruction;Cognitive remediation/compensation;Ultrasound   Plan grasp / release   OT Home Exercise Plan on going   Consulted and Agree with Plan of Care Patient      Patient will benefit from skilled therapeutic intervention in order to improve the following deficits and impairments:  Decreased activity tolerance, Decreased balance, Decreased coordination, Decreased endurance, Impaired flexibility, Impaired perceived functional ability, Decreased safety awareness, Decreased range of motion, Decreased mobility, Decreased knowledge of use of DME, Decreased knowledge of precautions, Decreased strength, Increased edema, Increased muscle spasms, Impaired sensation, Pain, Impaired tone, Impaired UE functional use, Impaired vision/preception, Decreased cognition  Visit Diagnosis: Stiffness of shoulder joint, left  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 Greater trochanteric bursitis of left hip 03/14/2016  . 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 Seizures (HCC) 06/21/2015  . Hemorrhage, intracerebral (HCC) 02/03/2015  . Hemiparesis (HCC) 02/03/2015  . Spastic hemiplegia affecting nondominant side (HCC) 02/03/2015    Collier SalinaGellert, Hagar Sadiq M, OTR/L 06/08/2016, 10:21 AM   [facility] [address] [place], [place], [address] Phone:  [phone]   Fax:  [phone]  Name: Michelle Kemp MRN: [id number] Date of Birth: 09/01/1940

## 2016-06-08 NOTE — Therapy (Signed)
[facility] [address] [place], [place], [address] Phone: [phone]   Fax:  [phone]  Physical Therapy Treatment  Patient Details  Name: Michelle Kemp MRN: [id number] Date of Birth: Aug 13, 1940 Referring Provider: Marvel Plan, MD  Encounter Date: 06/07/2016      PT End of Session - 06/07/16 1537    Visit Number 20   Number of Visits 25   Date for PT Re-Evaluation 07/08/16   Authorization Type Self pay   PT Start Time 1535   PT Stop Time 1615   PT Time Calculation (min) 40 min   Equipment Utilized During Treatment Gait belt   Activity Tolerance Patient tolerated treatment well   Behavior During Therapy [facility] for tasks assessed/performed      Past Medical History:  Diagnosis Date  . Arthritis   . Seizures (HCC)   . Stroke [facility])     Past Surgical History:  Procedure Laterality Date  . HERNIA REPAIR  2001    There were no vitals filed for this visit.      Subjective Assessment - 06/07/16 1534    Subjective No family present today, as patient arrived via public transportation. Attempted the language line below with no answer once transfered to translator after ~20 minutes. Pt nodded "no" to "pain". minimal facial grimacing noted with supine hip flexor stretching that resolved when activity modified to decrease the stretch being applied.                                 Pertinent History PT SPEAKS EWE--PLEASE USE THE LANGUAGE LINE FOR ALL APPOINTMENTS [id number] ACCESS CODE [phone].   Seizures   Patient Stated Goals Per daughter, "I just want her to be able to wak to the kitchen and get herself something."    Per patient, "I want that metal frame [name])."   Currently in Pain? No/denies   Pain Score 0-No pain  nodded no to pain              [facility] Adult PT Treatment/Exercise - 06/07/16 1551      Bed Mobility   Bed Mobility Sit to Supine;Supine to Sit;Sitting - Scoot to [place] of Bed;Rolling  Right;Rolling Left   Rolling Right 4: Min guard   Rolling Right Details (indicate cue type and reason) cues on technique   Rolling Left 4: Min guard;4: Min assist   Rolling Left Details (indicate cue type and reason) cues on technique   Supine to Sit 3: Mod assist;4: Min assist   Supine to Sit Details (indicate cue type and reason) increased assistance needed when coming up from weaker side (left) vs stronger side (right). mulimodal cues needed on sequencing and technique.                                      Sitting - Scoot to [place] of Bed 4: Min assist;4: Min guard   Sitting - Scoot to [place] of Bed Details (indicate cue type and reason) cues/facilitation for reciprocal scooting to edge of mat   Sit to Supine 4: Min assist;4: Min guard   Sit to Supine - Details (indicate cue type and reason) assist needed to control descent and clear mat surface with LE's with cues/facilitation as well.     Transfers   Transfers Sit to Stand;Stand to [employer]  Sit to Stand 4: Min assist   Sit to Stand Details Verbal cues for technique;Verbal cues for precautions/safety;Verbal cues for safe use of DME/AE   Sit to Stand Details (indicate cue type and reason) cues for use of UE on stedy. pt able to place right foot on plate with supervision, total assist needed for placement of left foot onto foot plate   Stand to Sit 4: Min assist   Stand to Sit Details (indicate cue type and reason) Verbal cues for precautions/safety;Verbal cues for sequencing;Verbal cues for technique;Verbal cues for safe use of DME/AE   Transfer via Lift Equipment Stedy     issued the following to pt's HEP:  Hip Flexor Stretch    Lying on back near edge of bed, bend one leg, foot flat. Hang other leg over edge, relaxed, thigh resting entirely on bed for __2__ minutes. Repeat __2-3__ times each leg, the left leg needs this most if only have time to do this on one leg.  Do _1-2_ sessions per day. Advanced Exercise: Bend  knee back keeping thigh in contact with bed.  http://gt2.exer.us/347   Copyright  VHI. All rights reserved.           PT Education - 06/07/16 1602    Education provided Yes   Education Details sent home stretch for hip flexors, more for the left leg than right, however both sides would benefit   Person(s) Educated Patient   Methods Explanation;Demonstration;Verbal cues;Handout   Comprehension Returned demonstration;Verbal cues required;Tactile cues required;Need further instruction          PT Short Term Goals - 05/24/16 1951      PT SHORT TERM GOAL #1   Title STG's = LTG's           PT Long Term Goals - 05/27/16 1037      PT LONG TERM GOAL #1   Title Daughter will perform home stretching program independently to maximize effectiveness of botox injections, manage spasticity.  (Target:  06/21/16)   Time 4   Period Weeks   Status On-going     PT LONG TERM GOAL #2   Title Pt will perform sit > stand from personal w/c with total A of Stedy with no physical assistance from PT or caregiver to indicate decreased caregiver burden with functional transfers.  (06/21/16)   Status On-going     PT LONG TERM GOAL #3   Title Further goals to be written to address bed mobility and standing posture, after pt further assessed.  (06/21/16)   Time 4   Period Weeks   Status On-going     PT LONG TERM GOAL #4   Title Pt will consistently perform supine <> sit with supervision/setup, 25% cueing to decrease caregiver burden with bed mobility.  (06/21/16)   Baseline 11/17: supine > sit with max A to L, min A to R; sit > supine with  total A to L, mod A to R.   Time 4   Period Weeks   Status New           Plan - 06/07/16 1537    Clinical Impression Statement Today's skilled session continued to work on bed mobility and [facility] caregiver burden. Also issued hip flexor stretch to assist with pt achieving upright posture. Pt's daughter arrived at end of  session and PTA was able to verbally expain the stretch with handout.             Rehab Potential  Fair   Clinical Impairments Affecting Rehab Potential patient self pay for services; time since CVA   PT Frequency 2x / week   PT Duration 4 weeks   PT Treatment/Interventions Gait training;Stair training;Neuromuscular re-education;Balance training;Therapeutic exercise;Therapeutic activities;Functional mobility training;Patient/family education;Wheelchair mobility training;Manual techniques;Orthotic Fit/Training;ADLs/Self Care Home Management;DME Instruction   PT Next Visit Plan continued to work on hip flexor stretching (modifed tomas, ? sidelying), transfers with [facility]tedy and on bed mobility toward goals.   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 Greater trochanteric bursitis of left hip 03/14/2016  . 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 Seizures (HCC) 06/21/2015  . Hemorrhage, intracerebral (HCC) 02/03/2015  . Hemiparesis (HCC) 02/03/2015  . Spastic hemiplegia affecting nondominant side (HCC) 02/03/2015    Sallyanne KusterKathy Lenka Zhao, PTA, [facility] [address], [address] [place], [place] [address] [phone] 06/08/16, 11:24 AM   Name: Michelle Kemp MRN: [id number] Date of Birth:  04/24/1941

## 2016-06-14 ENCOUNTER — Encounter: Payer: Self-pay | Admitting: Occupational Therapy

## 2016-06-14 ENCOUNTER — Encounter: Payer: Self-pay | Admitting: Physical Therapy

## 2016-06-14 ENCOUNTER — Ambulatory Visit: Payer: Self-pay | Admitting: Physical Therapy

## 2016-06-14 ENCOUNTER — Ambulatory Visit: Payer: Self-pay | Attending: Neurology | Admitting: Occupational Therapy

## 2016-06-14 VITALS — BP 150/52

## 2016-06-14 DIAGNOSIS — G8114 Spastic hemiplegia affecting left nondominant side: Secondary | ICD-10-CM | POA: Insufficient documentation

## 2016-06-14 DIAGNOSIS — M25612 Stiffness of left shoulder, not elsewhere classified: Secondary | ICD-10-CM | POA: Insufficient documentation

## 2016-06-14 DIAGNOSIS — R293 Abnormal posture: Secondary | ICD-10-CM | POA: Insufficient documentation

## 2016-06-14 DIAGNOSIS — R2681 Unsteadiness on feet: Secondary | ICD-10-CM

## 2016-06-14 DIAGNOSIS — G8929 Other chronic pain: Secondary | ICD-10-CM | POA: Insufficient documentation

## 2016-06-14 DIAGNOSIS — M25512 Pain in left shoulder: Secondary | ICD-10-CM | POA: Insufficient documentation

## 2016-06-14 NOTE — Patient Instructions (Signed)
06/14/16 Michelle Kemp, Michelle Kemp blood pressure was elevated after OT session to 142/110 on first check, 157/110 on second check by another PT. Had her sit and rest on the mat for 5 minutes with her blood pressure going down to 150/104. After another 5-6 minutes her blood pressure decreased to 150/102. We held PT today due to her elevated blood pressures.  Thanks,  Sallyanne KusterKathy Bury, PTA.

## 2016-06-14 NOTE — Therapy (Signed)
[facility] [address] [place], [place], [address] Phone: [phone]   Fax:  [phone]  Occupational Therapy Treatment  Patient Details  Name: Michelle Kemp MRN: [id number] Date of Birth: 05/05/1941 Referring Provider: Dr Roda ShuttersXu  Encounter Date: 06/14/2016      OT End of Session - 06/14/16 1533    Visit Number 7   Number of Visits 9   Date for OT Re-Evaluation 07/09/16   Authorization Type self pay   OT Start Time 1445   OT Stop Time 1530   OT Time Calculation (min) 45 min   Activity Tolerance Patient tolerated treatment well   Behavior During Therapy [facility] for tasks assessed/performed      Past Medical History:  Diagnosis Date  . Arthritis   . Seizures (HCC)   . Stroke [facility](HCC)     Past Surgical History:  Procedure Laterality Date  . HERNIA REPAIR  2001    There were no vitals filed for this visit.      Subjective Assessment - 06/14/16 1530    Currently in Pain? No/denies   Pain Score 0-No pain                      OT Treatments/Exercises (OP) - 06/14/16 0001      Neurological Re-education Exercises   Other Weight-Bearing Exercises 1 Attempted active hand to mouth pattern in sitting and open hand/ release in sitting.       Ultrasound   Ultrasound Location anterior shoulder, pectoralis insertion, bicpe tendon   Ultrasound Parameters 1.0 mHZ, continuous 0.8w/cm2, x 12 min   Ultrasound Goals Pain     Manual Therapy   Manual Therapy Passive ROM;Joint mobilization   Manual therapy comments improve joint motion shoulder and reduce pain.  Ease caregiver burden                  OT Short Term Goals - 04/27/16 1733      OT SHORT TERM GOAL #1   Title xxxx   Baseline xxxx   Time 0     OT SHORT TERM GOAL #2   Title xxxx   Baseline xxx   Time 0     OT SHORT TERM GOAL #3   Title xxxx   Baseline xxxx   Time 0     OT SHORT TERM GOAL #4   Title xxxx   Baseline xxxx   Time 0     OT SHORT TERM GOAL #5   Title xxxx   Baseline xxxx   Time 0           OT Long Term Goals - 05/25/16 1111      OT LONG TERM GOAL #1   Title Patient and caregiver will independently complete HEP  for LUE with pain report no greater than 1-2/10   Status On-going     OT LONG TERM GOAL #2   Title Patient will demonstrate 90 degrees of shoulder flexion, abduction without report of pain to aide in bathing and dressing    Status Achieved     OT LONG TERM GOAL #3   Title Patient will grasp and release 1-3 inch object, with left hand, with minimal assistance,  as part of bimanual ADL tas   Status On-going               Plan - 06/14/16 1533    Clinical Impression Statement Patient with decreased report of shoulder pain   Rehab  Potential Fair   Clinical Impairments Affecting Rehab Potential limited financial resources   OT Frequency 2x / week   OT Duration 4 weeks   OT Treatment/Interventions Self-care/ADL training;Electrical Stimulation;Cryotherapy;Moist Heat;Visual/perceptual remediation/compensation;Patient/family education;Balance training;Splinting;Therapeutic exercises;Therapeutic activities;Neuromuscular education;Functional Mobility Training;Fluidtherapy;Biofeedback;DME and/or AE instruction;Cognitive remediation/compensation;Ultrasound   Plan grasp/ release   OT Home Exercise Plan on going   Consulted and Agree with Plan of Care Patient      Patient will benefit from skilled therapeutic intervention in order to improve the following deficits and impairments:  Decreased activity tolerance, Decreased balance, Decreased coordination, Decreased endurance, Impaired flexibility, Impaired perceived functional ability, Decreased safety awareness, Decreased range of motion, Decreased mobility, Decreased knowledge of use of DME, Decreased knowledge of precautions, Decreased strength, Increased edema, Increased muscle spasms, Impaired sensation, Pain, Impaired tone,  Impaired UE functional use, Impaired vision/preception, Decreased cognition  Visit Diagnosis: 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 Greater trochanteric bursitis of left hip 03/14/2016  . 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 Seizures (HCC) 06/21/2015  . Hemorrhage, intracerebral (HCC) 02/03/2015  . Hemiparesis (HCC) 02/03/2015  . Spastic hemiplegia affecting nondominant side (HCC) 02/03/2015    Collier SalinaGellert, Erman Thum M, OTR/L 06/14/2016, 3:35 PM  [facility] [address] [place], [place], [address] Phone: [phone]   Fax:  [phone]  Name: Michelle Kemp MRN: [id number] Date of Birth: 08/25/1940

## 2016-06-15 ENCOUNTER — Encounter: Payer: Self-pay | Admitting: Occupational Therapy

## 2016-06-15 ENCOUNTER — Ambulatory Visit: Payer: Self-pay | Admitting: Occupational Therapy

## 2016-06-15 ENCOUNTER — Ambulatory Visit: Payer: Self-pay | Admitting: Rehabilitation

## 2016-06-15 DIAGNOSIS — G8114 Spastic hemiplegia affecting left nondominant side: Secondary | ICD-10-CM

## 2016-06-15 DIAGNOSIS — G8929 Other chronic pain: Secondary | ICD-10-CM

## 2016-06-15 DIAGNOSIS — M25612 Stiffness of left shoulder, not elsewhere classified: Secondary | ICD-10-CM

## 2016-06-15 DIAGNOSIS — R293 Abnormal posture: Secondary | ICD-10-CM

## 2016-06-15 DIAGNOSIS — R2681 Unsteadiness on feet: Secondary | ICD-10-CM

## 2016-06-15 DIAGNOSIS — M25512 Pain in left shoulder: Secondary | ICD-10-CM

## 2016-06-15 NOTE — Therapy (Signed)
[facility] [address] [place], [place], [address] Phone: [phone]   Fax:  [phone]  Occupational Therapy Treatment  Patient Details  Name: Michelle Kemp MRN: [id number] Date of Birth: 01/18/1941 Referring Provider: Dr Roda ShuttersXu  Encounter Date: 06/15/2016      OT End of Session - 06/15/16 1653    Visit Number 8   Number of Visits 9   Date for OT Re-Evaluation 07/09/16   Authorization Type self pay   OT Start Time 1535   OT Stop Time 1615   OT Time Calculation (min) 40 min   Activity Tolerance Patient tolerated treatment well   Behavior During Therapy [facility] for tasks assessed/performed      Past Medical History:  Diagnosis Date  . Arthritis   . Seizures (HCC)   . Stroke [facility](HCC)     Past Surgical History:  Procedure Laterality Date  . HERNIA REPAIR  2001    There were no vitals filed for this visit.      Subjective Assessment - 06/15/16 1627    Subjective  Patient tearful - pointing at the sky   Currently in Pain? No/denies   Pain Score 0-No pain                      OT Treatments/Exercises (OP) - 06/15/16 0001      ADLs   ADL Comments Reviewed with patient's daughter in daughter's presence that next session will be the final visit this episode.  [name] indicated that if patient could just use her hand - she woul dnot be so depressed, and could probably walk better.  Discussed with daughter that isolated hand function at this point is probably not a realistic goal.  Reviewed that it would be a successful outcome of this therapy if patient has less pain and stiffness in left arm - to reduce caregiver burden.  [name] expressed frustration at patient's decline in functional mobility.  Reviewed that she needed to discuss further mobility goals with PT - and that perhaps patient may benefit from receiving care in her own home.       Neurological Re-education Exercises   Other Exercises 1 Neuromuscular reeducation to address functional mobility.  Patient transitioned from sit to stand with min assist, and walked about 20 feet with rolling walker and mod assist to guide left leg through stepping.  Patient slightly short of breath after short walking exercise.       Programme researcher, broadcasting/film/videolectrical Stimulation   Electrical Stimulation Location left forearm   Electrical Stimulation Action NMES for wrist and digit extension                OT Education - 06/15/16 1649    Education provided Yes   Education Details discussed with daughter plan for discharge from OT   Person(s) Educated Patient;Child(ren)   Methods Explanation;Demonstration   Comprehension Verbalized understanding          OT Short Term Goals - 04/27/16 1733      OT SHORT TERM GOAL #1   Title xxxx   Baseline xxxx   Time 0     OT SHORT TERM GOAL #2   Title xxxx   Baseline xxx   Time 0     OT SHORT TERM GOAL #3   Title xxxx   Baseline xxxx   Time 0     OT SHORT TERM GOAL #4   Title xxxx   Baseline xxxx   Time  0     OT SHORT TERM GOAL #5   Title xxxx   Baseline xxxx   Time 0           OT Long Term Goals - 05/25/16 1111      OT LONG TERM GOAL #1   Title Patient and caregiver will independently complete HEP  for LUE with pain report no greater than 1-2/10   Status On-going     OT LONG TERM GOAL #2   Title Patient will demonstrate 90 degrees of shoulder flexion, abduction without report of pain to aide in bathing and dressing    Status Achieved     OT LONG TERM GOAL #3   Title Patient will grasp and release 1-3 inch object, with left hand, with minimal assistance,  as part of bimanual ADL tas   Status On-going               Plan - 06/15/16 1653    Clinical Impression Statement Patient with improved passve motion in shoulder and elbow, decreased pain, but limited active motor return in left UE   Rehab Potential Fair   Clinical Impairments Affecting Rehab Potential  limited financial resources   OT Frequency 2x / week   OT Duration 4 weeks   OT Treatment/Interventions Self-care/ADL training;Electrical Stimulation;Cryotherapy;Moist Heat;Visual/perceptual remediation/compensation;Patient/family education;Balance training;Splinting;Therapeutic exercises;Therapeutic activities;Neuromuscular education;Functional Mobility Training;Fluidtherapy;Biofeedback;DME and/or AE instruction;Cognitive remediation/compensation;Ultrasound   Plan Review goals and discharge from OT   Consulted and Agree with Plan of Care Patient   Family Member Consulted Daughter, Raynelle FanningJulie      Patient will benefit from skilled therapeutic intervention in order to improve the following deficits and impairments:  Decreased activity tolerance, Decreased balance, Decreased coordination, Decreased endurance, Impaired flexibility, Impaired perceived functional ability, Decreased safety awareness, Decreased range of motion, Decreased mobility, Decreased knowledge of use of DME, Decreased knowledge of precautions, Decreased strength, Increased edema, Increased muscle spasms, Impaired sensation, Pain, Impaired tone, Impaired UE functional use, Impaired vision/preception, Decreased cognition  Visit Diagnosis: Abnormal posture  Unsteadiness on feet  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 Greater trochanteric bursitis of left hip 03/14/2016  . 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 Seizures (HCC) 06/21/2015  . Hemorrhage, intracerebral (HCC) 02/03/2015  . Hemiparesis (HCC) 02/03/2015  . Spastic hemiplegia affecting nondominant side (HCC) 02/03/2015    Collier SalinaGellert, Danesha Kirchoff M, OTR/L 06/15/2016, 4:55 PM  [facility] [address] [place], [place], [address] Phone: [phone]   Fax:  [phone]  Name: Michelle Kemp MRN: [id number] Date of Birth: 04/05/1941

## 2016-06-15 NOTE — Therapy (Signed)
[facility] [address] [place], [place], [address] Phone: [phone]   Fax:  [phone]  Physical Therapy Treatment  Patient Details  Name: Michelle Kemp MRN: [id number] Date of Birth: 10/21/1940 Referring Provider: Marvel PlanJindong Xu, MD  Encounter Date: 06/14/2016      PT End of Session - 06/14/16 1532    Visit Number 20  cancelled visit- did not change number   Number of Visits 25   Date for PT Re-Evaluation 07/08/16   Authorization Type Self pay   PT Start Time 1530   PT Stop Time 1600  not charging   PT Time Calculation (min) 30 min   Equipment Utilized During Treatment Other (comment)  stedy   Activity Tolerance Treatment limited secondary to medical complications (Comment)   Behavior During Therapy [facility] for tasks assessed/performed      Past Medical History:  Diagnosis Date  . Arthritis   . Seizures (HCC)   . Stroke [facility](HCC)     Past Surgical History:  Procedure Laterality Date  . HERNIA REPAIR  2001    Vitals:   06/14/16 1531 06/14/16 1542 06/14/16 1547 06/14/16 1555  BP: (!) 142/110 (!) 157/110 (!) 150/104 (!) 150/52        Subjective Assessment - 06/14/16 1531    Subjective No family present at start of session today. OT saw pt prior to PT session and reported no signs of pain or discomfort with her session.    Pertinent History PT SPEAKS EWE--PLEASE USE THE LANGUAGE LINE FOR ALL APPOINTMENTS [phone] ACCESS CODE [phone].   Seizures   Patient Stated Goals Per daughter, "I just want her to be able to wak to the kitchen and get herself something."    Per patient, "I want that metal frame [name]."   Currently in Pain? No/denies   Pain Score 0-No pain           [facility] Adult PT Treatment/Exercise - 06/14/16 1543      Transfers   Transfers Sit to Stand;Stand to Sit;Stand Pivot Transfers   Stand to Sit 4: Min assist   Stand to Sit: Patient Percentage 80%   Stand to Sit Details (indicate  cue type and reason) Verbal cues for precautions/safety;Verbal cues for sequencing;Verbal cues for technique;Verbal cues for safe use of DME/AE   Stand to Sit Details cues to use UE on stedy to controll descent to mat with sitting down   Transfer via Lift Equipment Stedy           PT Short Term Goals - 05/24/16 1951      PT SHORT TERM GOAL #1   Title STG's = LTG's           PT Long Term Goals - 05/27/16 1037      PT LONG TERM GOAL #1   Title Daughter will perform home stretching program independently to maximize effectiveness of botox injections, manage spasticity.  (Target:  06/21/16)   Time 4   Period Weeks   Status On-going     PT LONG TERM GOAL #2   Title Pt will perform sit > stand from personal w/c with total A of Stedy with no physical assistance from PT or caregiver to indicate decreased caregiver burden with functional transfers.  (06/21/16)   Status On-going     PT LONG TERM GOAL #3   Title Further goals to be written to address bed mobility and standing posture, after pt further assessed.  (06/21/16)   Time 4  Period Weeks   Status On-going     PT LONG TERM GOAL #4   Title Pt will consistently perform supine <> sit with supervision/setup, 25% cueing to decrease caregiver burden with bed mobility.  (06/21/16)   Baseline 11/17: supine > sit with max A to L, min A to R; sit > supine with  total A to L, mod A to R.   Time 4   Period Weeks   Status New               Plan - 06/14/16 1532    Clinical Impression Statement Today's session was held due to elevated BP readings. Written note sent home with pt for daughter with today's BP readings listed as I was unable to reach daughter on phone. Primary PT made aware.    Rehab Potential Fair   Clinical Impairments Affecting Rehab Potential patient self pay for services; time since CVA   PT Frequency 2x / week   PT Duration 4 weeks   PT Treatment/Interventions Gait training;Stair training;Neuromuscular  re-education;Balance training;Therapeutic exercise;Therapeutic activities;Functional mobility training;Patient/family education;Wheelchair mobility training;Manual techniques;Orthotic Fit/Training;ADLs/Self Care Home Management;DME Instruction   PT Next Visit Plan continued to work on hip flexor stretching (modifed tomas, ? sidelying), transfers with [facility] and on bed mobility toward goals.   Consulted and Agree with Plan of Care Patient      Patient will benefit from skilled therapeutic intervention in order to improve the following deficits and impairments:  Abnormal gait, Decreased balance, Decreased mobility, Decreased strength, Postural dysfunction, Impaired flexibility, Pain, Decreased activity tolerance, Decreased endurance, Increased muscle spasms, Difficulty walking, Impaired tone, Decreased coordination  Visit Diagnosis: Abnormal posture  Unsteadiness on feet     Problem List Patient Active Problem List   Diagnosis Date Noted  . Greater trochanteric bursitis of left hip 03/14/2016  . 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 Seizures (HCC) 06/21/2015  . Hemorrhage, intracerebral (HCC) 02/03/2015  . Hemiparesis (HCC) 02/03/2015  . Spastic hemiplegia affecting nondominant side (HCC) 02/03/2015    Sallyanne KusterKathy Kemp, PTA, [facility] [address], [address] [place], [place] [address] [phone] 06/15/16, 12:06 PM   Name: Michelle Kemp MRN: [id number] Date of Birth: 04/07/1941

## 2016-06-16 ENCOUNTER — Ambulatory Visit: Payer: Self-pay | Admitting: Physical Medicine & Rehabilitation

## 2016-06-21 ENCOUNTER — Ambulatory Visit: Payer: Self-pay | Admitting: Physical Therapy

## 2016-06-21 ENCOUNTER — Ambulatory Visit: Payer: Self-pay | Admitting: Occupational Therapy

## 2016-06-22 ENCOUNTER — Ambulatory Visit: Payer: Self-pay | Admitting: Rehabilitation

## 2016-06-22 ENCOUNTER — Encounter: Payer: Self-pay | Admitting: Occupational Therapy

## 2016-06-26 ENCOUNTER — Ambulatory Visit: Payer: Self-pay | Admitting: Rehabilitation

## 2016-06-28 ENCOUNTER — Ambulatory Visit: Payer: Self-pay | Admitting: Occupational Therapy

## 2016-06-28 ENCOUNTER — Encounter: Payer: Self-pay | Admitting: Occupational Therapy

## 2016-06-28 DIAGNOSIS — G8114 Spastic hemiplegia affecting left nondominant side: Secondary | ICD-10-CM

## 2016-06-28 DIAGNOSIS — R2681 Unsteadiness on feet: Secondary | ICD-10-CM

## 2016-06-28 DIAGNOSIS — G8929 Other chronic pain: Secondary | ICD-10-CM

## 2016-06-28 DIAGNOSIS — M25512 Pain in left shoulder: Secondary | ICD-10-CM

## 2016-06-28 DIAGNOSIS — R293 Abnormal posture: Secondary | ICD-10-CM

## 2016-06-28 DIAGNOSIS — M25612 Stiffness of left shoulder, not elsewhere classified: Secondary | ICD-10-CM

## 2016-06-28 NOTE — Therapy (Signed)
[place] [address] [place], [place], [address] Phone: [phone]   Fax:  [phone]  Occupational Therapy Treatment  Patient Details  Name: Michelle Kemp MRN: [id number] Date of Birth: [birth year] Referring Provider: Dr Erlinda Hong  Encounter Date: 06/28/2016      OT End of Session - 06/28/16 1649    Visit Number 9   Number of Visits 9   Date for OT Re-Evaluation 07/09/16   Authorization Type self pay   OT Start Time 1530   OT Stop Time 1608   OT Time Calculation (min) 38 min   Activity Tolerance Patient tolerated treatment well   Behavior During Therapy [facility] for tasks assessed/performed      Past Medical History:  Diagnosis Date  . Arthritis   . Seizures ([place])   . Stroke [facility])     Past Surgical History:  Procedure Laterality Date  . HERNIA REPAIR  2001    There were no vitals filed for this visit.                    OT Treatments/Exercises (OP) - 06/28/16 0001      Neurological Re-education Exercises   Other Exercises 1 Reviewed remaining OT goals.  Family not present for this session, but in prior sessions have reinforced with daughter [name] that daily range of motion to left arm is helpful to maintain current motion, and this will ease with daily care of patient, and reduce patient's shoulder pain.  Patient rarely reports shoulder discomfort at this point.  Worked on transitional movement and short distance ambulation.  Patient walks approximately 20 feet with mod assist and then reports hip and knee pain, requiring rest break.  Walked x2.     Other Exercises 2 Addressed patient's ability to grasp and release 1-3 inch object as per goal.  Patient now consistently able to grasp object, but very inconsistently can release - so this goal was not fully met.  Estim was used periodically during this episode of care, and while extensors in wrist and fingers could be illicited during stimulation, it  could not be reproduced outside of electrical stimulation, and therefore is not functional for patient at this time.                    OT Short Term Goals - 04/27/16 1733      OT SHORT TERM GOAL #1   Title xxxx   Baseline xxxx   Time 0     OT SHORT TERM GOAL #2   Title xxxx   Baseline xxx   Time 0     OT SHORT TERM GOAL #3   Title xxxx   Baseline xxxx   Time 0     OT SHORT TERM GOAL #4   Title xxxx   Baseline xxxx   Time 0     OT SHORT TERM GOAL #5   Title xxxx   Baseline xxxx   Time 0           OT Long Term Goals - 06/28/16 1651      OT LONG TERM GOAL #3   Title Patient will grasp and release 1-3 inch object, with left hand, with minimal assistance,  as part of bimanual ADL tas   Status Partially Met  can grasp but not release               Plan - 06/28/16 1649    Clinical Impression Statement Patient  has reached maximum benefit of OT at this time.  Do nopt anticipate that patient would benefit from additional OT unless she suffers a significant decline  in current functioning.     Rehab Potential Fair   Clinical Impairments Affecting Rehab Potential limited financial resources   OT Frequency 2x / week   OT Duration 4 weeks   OT Treatment/Interventions Self-care/ADL training;Electrical Stimulation;Cryotherapy;Moist Heat;Visual/perceptual remediation/compensation;Patient/family education;Balance training;Splinting;Therapeutic exercises;Therapeutic activities;Neuromuscular education;Functional Mobility Training;Fluidtherapy;Biofeedback;DME and/or AE instruction;Cognitive remediation/compensation;Ultrasound   Plan discharge from [place] and Agree with Plan of Care Patient   Family Member Consulted Have discussed this with daughter in prior OT sessions      Patient will benefit from skilled therapeutic intervention in order to improve the following deficits and impairments:  Decreased activity tolerance, Decreased balance, Decreased  coordination, Decreased endurance, Impaired flexibility, Impaired perceived functional ability, Decreased safety awareness, Decreased range of motion, Decreased mobility, Decreased knowledge of use of DME, Decreased knowledge of precautions, Decreased strength, Increased edema, Increased muscle spasms, Impaired sensation, Pain, Impaired tone, Impaired UE functional use, Impaired vision/preception, Decreased cognition  Visit Diagnosis: Abnormal posture  Unsteadiness on feet  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 Greater trochanteric bursitis of left hip 03/14/2016  . Nontraumatic subcortical hemorrhage of right cerebral hemisphere ([place]) 09/15/2015  . Seizures ([place]) 06/21/2015  . Hemorrhage, intracerebral ([place]) 02/03/2015  . Hemiparesis ([place]) 02/03/2015  . Spastic hemiplegia affecting nondominant side ([place]) 02/03/2015   OCCUPATIONAL THERAPY DISCHARGE SUMMARY  Visits from Start of Care: 9  Current functional level related to goals / functional outcomes: Patient with significant improvement in passive range of motion, and reduction in pain  in left shoulder and elbow   Remaining deficits: Spastic hemiplegia, decreased awareness / insight, arthritis/ pain, musculoskeletal deformities (foot)   Education / Equipment: PROM Program for family Plan: Patient agrees to discharge.  Patient goals were partially met. Patient is being discharged due to meeting the stated rehab goals.  ?????     Mariah Milling , OTR/L 06/28/2016, 4:52 PM  [place] [address] [place], [place], [address] Phone: [phone]   Fax:  [phone]  Name: Michelle Kemp MRN: [id number] Date of Birth: August 23, 1940

## 2016-06-30 ENCOUNTER — Ambulatory Visit: Payer: Self-pay | Admitting: Rehabilitation

## 2016-06-30 ENCOUNTER — Encounter: Payer: Self-pay | Admitting: Occupational Therapy

## 2016-07-11 ENCOUNTER — Ambulatory Visit: Payer: Self-pay | Admitting: Physical Therapy

## 2016-07-11 ENCOUNTER — Ambulatory Visit (HOSPITAL_BASED_OUTPATIENT_CLINIC_OR_DEPARTMENT_OTHER): Payer: Self-pay | Admitting: Physical Medicine & Rehabilitation

## 2016-07-11 ENCOUNTER — Encounter: Payer: Self-pay | Attending: Physical Medicine & Rehabilitation

## 2016-07-11 ENCOUNTER — Encounter: Payer: Self-pay | Admitting: Physical Medicine & Rehabilitation

## 2016-07-11 VITALS — BP 149/99 | HR 85

## 2016-07-11 DIAGNOSIS — M7062 Trochanteric bursitis, left hip: Secondary | ICD-10-CM

## 2016-07-11 DIAGNOSIS — G8110 Spastic hemiplegia affecting unspecified side: Secondary | ICD-10-CM | POA: Insufficient documentation

## 2016-07-11 DIAGNOSIS — Z7982 Long term (current) use of aspirin: Secondary | ICD-10-CM | POA: Insufficient documentation

## 2016-07-11 NOTE — Progress Notes (Signed)
Left hipTrochanteric bursa injection  without ultrasound guidance  Indication Trochanteric bursitis. Exam has tenderness over the greater trochanter of the hip. Pain has not responded to conservative care such as exercise therapy and oral medications. Pain interferes with sleep or with mobility Informed consent was obtained after describing risks and benefits of the procedure with the patient these include bleeding bruising and infection. Patient has signed written consent form. Patient placed in a lateral decubitus position with the affected hip superior. Point of maximal pain was palpated marked and prepped with Betadine and entered with a needle to bone contact. Needle slightly withdrawn then 6mg  of betamethasone with 4 cc 1% lidocaine were injected. Patient tolerated procedure well. Post procedure instructions given.

## 2016-07-13 ENCOUNTER — Encounter: Payer: Self-pay | Admitting: Physical Therapy

## 2016-07-13 ENCOUNTER — Ambulatory Visit: Payer: Self-pay | Attending: Neurology | Admitting: Physical Therapy

## 2016-07-13 DIAGNOSIS — R293 Abnormal posture: Secondary | ICD-10-CM | POA: Insufficient documentation

## 2016-07-13 DIAGNOSIS — G8114 Spastic hemiplegia affecting left nondominant side: Secondary | ICD-10-CM | POA: Insufficient documentation

## 2016-07-13 DIAGNOSIS — R2681 Unsteadiness on feet: Secondary | ICD-10-CM | POA: Insufficient documentation

## 2016-07-13 DIAGNOSIS — R2689 Other abnormalities of gait and mobility: Secondary | ICD-10-CM | POA: Insufficient documentation

## 2016-07-14 NOTE — Therapy (Signed)
[facility] [address] [place], [place], [address] Phone: [phone]   Fax:  [phone]  Physical Therapy Treatment  Patient Details  Name: Michelle Kemp MRN: [id number] Date of Birth: 01/06/1941 Referring Provider: Marvel Plan, MD  Encounter Date: 07/13/2016      PT End of Session - 07/13/16 1541    Visit Number 21  cancelled visit- did not change number   Number of Visits 25   Date for PT Re-Evaluation 07/08/16   Authorization Type Self pay   PT Start Time 1531   PT Stop Time 1612   PT Time Calculation (min) 41 min   Equipment Utilized During Treatment Other (comment);Gait belt  stedy   Activity Tolerance Patient tolerated treatment well   Behavior During Therapy [facility] for tasks assessed/performed      Past Medical History:  Diagnosis Date  . Arthritis   . Seizures (HCC)   . Stroke [facility])     Past Surgical History:  Procedure Laterality Date  . HERNIA REPAIR  2001    There were no vitals filed for this visit.      Subjective Assessment - 07/13/16 1535    Subjective Daughter present today. Saw MD and had pain shot to left hip. Have not ordered the lift as her daughter does not want to give her anymore reasons to not walk.    Patient is accompained by: Family member  daughter, Michelle Kemp   Pertinent History PT SPEAKS EWE--PLEASE USE THE LANGUAGE LINE FOR ALL APPOINTMENTS [phone] ACCESS CODE [phone].   Seizures   Patient Stated Goals Per daughter, "I just want her to be able to wak to the kitchen and get herself something."    Per patient, "I want that metal frame Michelle Kemp)."   Currently in Pain? No/denies   Pain Score 0-No pain        07/13/16 1550  Transfers  Transfers Sit to Stand;Stand to Sit  Sit to Stand 4: Min assist;3: Mod assist;With upper extremity assist;From bed;From chair/3-in-1  Stand to Sit 4: Min assist;With upper extremity assist;To bed;To chair/3-in-1  Stand Pivot Transfers 3:  Mod assist  Stand Pivot Transfer Details (indicate cue type and reason) wheelchair<>mat table with mod assist, cues on sequencing and technique needed  Comments use of stedy lift x2 reps working on standing tolerance and posture with standing. daughter shown how to use lift to assist pt at home             PT Short Term Goals - 05/24/16 1951      PT SHORT TERM GOAL #1   Title STG's = LTG's           PT Long Term Goals - 05/27/16 1037      PT LONG TERM GOAL #1   Title Daughter will perform home stretching program independently to maximize effectiveness of botox injections, manage spasticity.  (Target:  06/21/16)   Time 4   Period Weeks   Status On-going     PT LONG TERM GOAL #2   Title Pt will perform sit > stand from personal w/c with total A of Stedy with no physical assistance from PT or caregiver to indicate decreased caregiver burden with functional transfers.  (06/21/16)   Status On-going     PT LONG TERM GOAL #3   Title Further goals to be written to address bed mobility and standing posture, after pt further assessed.  (06/21/16)   Time 4   Period Weeks   Status  On-going     PT LONG TERM GOAL #4   Title Pt will consistently perform supine <> sit with supervision/setup, 25% cueing to decrease caregiver burden with bed mobility.  (06/21/16)   Baseline 11/17: supine > sit with max A to L, min A to R; sit > supine with  total A to L, mod A to R.   Time 4   Period Weeks   Status New               Plan - 07/13/16 1650    Clinical Impression Statement Pt's daughter present today and expressing frustration with pt's decline in function. She reports she has called the private in home therapist who has not started yet, is suppossed to be calling her back this month. Daughter wants pt to be at level she was at with previous episode of therapy, walking short distances with min assist. She feels that since her mom got pain injection into her knee this might be more  achievable. Advised daughter we would need someone with pt to assist with cummunitcation and for education for home assist. Daughter verbalized understanding. Also discussed in length the benefits of in home vs here, daughter verbalized understanding. On discussion with primary PT was agreed to continue here through January 1x week to work towards these goals while daughter works on getting pt set up with in home PT. Primary PT to renew.                                          Rehab Potential Fair   Clinical Impairments Affecting Rehab Potential patient self pay for services; time since CVA   PT Frequency 2x / week   PT Duration 4 weeks   PT Treatment/Interventions Gait training;Stair training;Neuromuscular re-education;Balance training;Therapeutic exercise;Therapeutic activities;Functional mobility training;Patient/family education;Wheelchair mobility training;Manual techniques;Orthotic Fit/Training;ADLs/Self Care Home Management;DME Instruction   PT Next Visit Plan continued to work on hip flexor stretching (modifed tomas, ? sidelying), [facility] and on bed mobility toward goals.   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 Greater trochanteric bursitis of left hip 03/14/2016  . Nontraumatic subcortical hemorrhage of right cerebral hemisphere  (HCC) 09/15/2015  . Seizures (HCC) 06/21/2015  . Hemorrhage, intracerebral (HCC) 02/03/2015  . Hemiparesis (HCC) 02/03/2015  . Spastic hemiplegia affecting nondominant side (HCC) 02/03/2015    Sallyanne KusterKathy Jacquilyn Seldon, PTA, [facility] [address], [address] [place], [place] [address] [phone] 07/17/16, 8:27 AM   Name: Lillia Abedkuwavi Velazquez MRN: [id number] Date of Birth: 08/27/1940

## 2016-07-17 NOTE — Addendum Note (Signed)
Addended by: Harriet ButtePARCELL, Elden Brucato A on: 07/17/2016 09:20 AM   Modules accepted: Orders

## 2016-07-20 ENCOUNTER — Ambulatory Visit: Payer: Self-pay | Admitting: Physical Therapy

## 2016-07-27 ENCOUNTER — Ambulatory Visit: Payer: Self-pay | Admitting: Physical Therapy

## 2016-08-04 ENCOUNTER — Ambulatory Visit: Payer: Self-pay | Admitting: Rehabilitation

## 2016-08-04 ENCOUNTER — Encounter: Payer: Self-pay | Admitting: Rehabilitation

## 2016-08-04 DIAGNOSIS — R2689 Other abnormalities of gait and mobility: Secondary | ICD-10-CM

## 2016-08-04 DIAGNOSIS — R293 Abnormal posture: Secondary | ICD-10-CM

## 2016-08-04 DIAGNOSIS — R2681 Unsteadiness on feet: Secondary | ICD-10-CM

## 2016-08-04 DIAGNOSIS — G8114 Spastic hemiplegia affecting left nondominant side: Secondary | ICD-10-CM

## 2016-08-04 NOTE — Therapy (Addendum)
[facility] [address] [place], [place], [address] Phone: [phone]   Fax:  [phone]  Physical Therapy Treatment and DC Summary  Patient Details  Name: Michelle Kemp MRN: [id number] Date of Birth: Nov 12, 1940 Referring Provider: Rosalin Hawking, MD  Encounter Date: 08/04/2016      PT End of Session - 08/04/16 1918    Visit Number 22   Number of Visits 29  per updated POC   Date for PT Re-Evaluation 09/03/16  per updated POC   Authorization Type Self pay   PT Start Time 1446   PT Stop Time 1530   PT Time Calculation (min) 44 min   Equipment Utilized During Treatment Other (comment);Gait belt  stedy   Activity Tolerance Patient tolerated treatment well   Behavior During Therapy [facility] for tasks assessed/performed      Past Medical History:  Diagnosis Date  . Arthritis   . Seizures ([place])   . Stroke [facility])     Past Surgical History:  Procedure Laterality Date  . HERNIA REPAIR  2001    There were no vitals filed for this visit.      Subjective Assessment - 08/04/16 1823    Subjective Daughter present today, reports they are walking at home with RW and she is strapping hand to RW or manually holding in place.  She is walking with no shoes on at home.    Patient is accompained by: Family member   Pertinent History PT SPEAKS EWE--PLEASE USE THE LANGUAGE LINE FOR ALL APPOINTMENTS [phone] ACCESS CODE [phone].   Seizures   Patient Stated Goals Per daughter, "I just want her to be able to wak to the kitchen and get herself something."    Per patient, "I want that metal frame Charlaine Dalton)."   Currently in Pain? No/denies                         [facility] Adult PT Treatment/Exercise - 08/04/16 0001      Transfers   Transfers Sit to Stand;Stand to Sit   Sit to Stand 4: Min assist;3: Mod assist;With upper extremity assist;From bed;From chair/3-in-1   Sit to Stand Details Verbal cues for  technique;Verbal cues for precautions/safety;Verbal cues for safe use of DME/AE   Sit to Stand Details (indicate cue type and reason) Continue to require facilitation for forward weight shift over BLEs and cues for R hand placement and ensuring LLE in proper position prior to stnading.     Stand to Sit 4: Min assist;With upper extremity assist;To bed;To chair/3-in-1   Stand to Sit Details (indicate cue type and reason) Verbal cues for precautions/safety;Verbal cues for sequencing;Verbal cues for technique;Verbal cues for safe use of DME/AE     Ambulation/Gait   Ambulation/Gait Yes   Ambulation/Gait Assistance 4: Min assist;3: Mod assist   Ambulation/Gait Assistance Details Pt ambulated with daugther in beginning of session to assess current ambulatory status.  Pt and daughter ambulated approx 67' with RW with daughter assisting LUE due to hand orthosis hurting pts hand.  Attempted again at the end of session, however pt unable to ambulate more than 12' with significant trunk rotation, LLE external rotation, decreased L hip and knee flex and needs increased assist to maintain LLE inside of RW.  W/c had to be placed beside of pt as she was unable to continue safely.     Ambulation Distance (Feet) 30 Feet  then 12   Assistive device Rolling walker  surgical shoe cover to simulate L toe cap   Gait Pattern Decreased stance time - left;Decreased step length - left;Decreased hip/knee flexion - left;Decreased dorsiflexion - left;Decreased weight shift to left;Trunk rotated posteriorly on left;Step-through pattern;Poor foot clearance - left;Decreased weight shift to right;Abducted - left   Ambulation Surface Level;Indoor     Self-Care   Self-Care Other Self-Care Comments   Other Self-Care Comments  Had lengthy discussion with pts daughter during session regarding her significant deficits, not only stroke related but orthopedic related.  PT feels that she will likely never be able to walk without assistance  and it will likely never look "normal".  Daughter doesn't seem to fully understand the severity of deficits and prognosis regardless of being told many times.  Discussed that we would have Gerald Stabs from [place] come to see if different brace could assist pt with gait, however that she would HAVE to wear shoes with brace for this to be effective and that therapy would end following one more month of therapy.       Neuro Re-ed    Neuro Re-ed Details  Worked on standing balance while utilizing Stedy for safety to address pt managing her LUE moving from inner grab bar to outer and back.  Pt with decreased ability to fully stand unsupported and relies on forward leaning on bar or sitting back onto pads to perform.  Also addressed in standing to RW for carryover, again she is unable to do so due to strength, balance and cognitive deficits.                  PT Education - 08/04/16 1917    Education provided Yes   Education Details see self care   Person(s) Educated Patient;Child(ren)   Methods Explanation   Comprehension Verbalized understanding          PT Short Term Goals - 05/24/16 1951      PT SHORT TERM GOAL #1   Title STG's = LTG's           PT Long Term Goals - 08/04/16 1923      PT LONG TERM GOAL #1   Title Daughter will perform home stretching program independently to maximize effectiveness of botox injections, manage spasticity.  (Modified Target:  09/03/16)   Time 4   Period Weeks   Status On-going     PT LONG TERM GOAL #2   Title Pt will perform sit > stand from personal w/c with total A of Stedy with no physical assistance from PT or caregiver to indicate decreased caregiver burden with functional transfers.     Status On-going     PT LONG TERM GOAL #3   Title Pt will perform sit<>stand from w/c at min A level consistently in order to decrease burden of care with mobility.     Time 4   Period Weeks   Status On-going     PT LONG TERM GOAL #4   Title Pt will have  formal brace assessment to determine if more appropriate brace is warranted for improved gait/decreased fall risk.     Time 4   Period Weeks   Status On-going     PT LONG TERM GOAL #5   Title Pt will ambulate short household distances up to 25' with LRAD and min A in order to indicate improved functional mobility at home.                 Plan - 08/04/16 1920  Clinical Impression Statement Skilled session focused on PT emphasizing pts deficits and the liklihood that this will be permanent functional status and that she will always need assist for gait.  Also worked on standing balance with pt self managing LUE to carryover to placement on RW, however this was very difficult and am unsure if she will be able to do this functionally.  Will renew for one more month to assess for bracing needs and D/C following this renewal.  Daughter verbalized understanding.    Rehab Potential Fair   Clinical Impairments Affecting Rehab Potential patient self pay for services; time since CVA   PT Frequency 2x / week   PT Duration 4 weeks   PT Treatment/Interventions Gait training;Stair training;Neuromuscular re-education;Balance training;Therapeutic exercise;Therapeutic activities;Functional mobility training;Patient/family education;Wheelchair mobility training;Manual techniques;Orthotic Fit/Training;ADLs/Self Care Home Management;DME Instruction   PT Next Visit Plan gait with RW-we need to try and figure out a better option for L hand placement as the hand orthosis hurts her hand and daughter strapping it is just not safe.  Have Gerald Stabs assess for brace (there may not be another option other than what she already has-will need to speak with daughter regarding this if this is the case.     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    Education / Equipment: HEP  Plan: Patient agrees to discharge.  Patient goals were not met. Patient is being discharged due to the patient's request.  ?????       Problem List Patient Active Problem List   Diagnosis Date Noted  . Greater trochanteric bursitis of left hip 03/14/2016  . 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 Seizures ([place]) 06/21/2015  . Hemorrhage, intracerebral ([place]) 02/03/2015  . Hemiparesis ([place]) 02/03/2015  . Spastic hemiplegia affecting nondominant side ([place]) 02/03/2015    Cameron Sprang, PT, MPT [facility] [address] [place], [place], [address] Phone: [phone]   Fax:  [phone] 08/04/16, 7:28 PM  Name: Michelle Kemp MRN: [id number] Date of Birth: 08/13/1940

## 2016-08-11 ENCOUNTER — Ambulatory Visit: Payer: Self-pay | Admitting: Physical Therapy

## 2016-08-14 ENCOUNTER — Ambulatory Visit: Payer: Self-pay | Admitting: Rehabilitation

## 2016-08-18 ENCOUNTER — Ambulatory Visit: Payer: Self-pay | Admitting: Physical Medicine & Rehabilitation

## 2016-08-19 IMAGING — CR DG CHEST 2V
2 series · 2 of 2 positions shown · non-contrast
Comparison: Chest CT 01/18/2015

CLINICAL DATA: Evaluation for pneumonia. Left lower lobe crackles.
Cough.

EXAM:
CHEST  2 VIEW

[w chest lat]
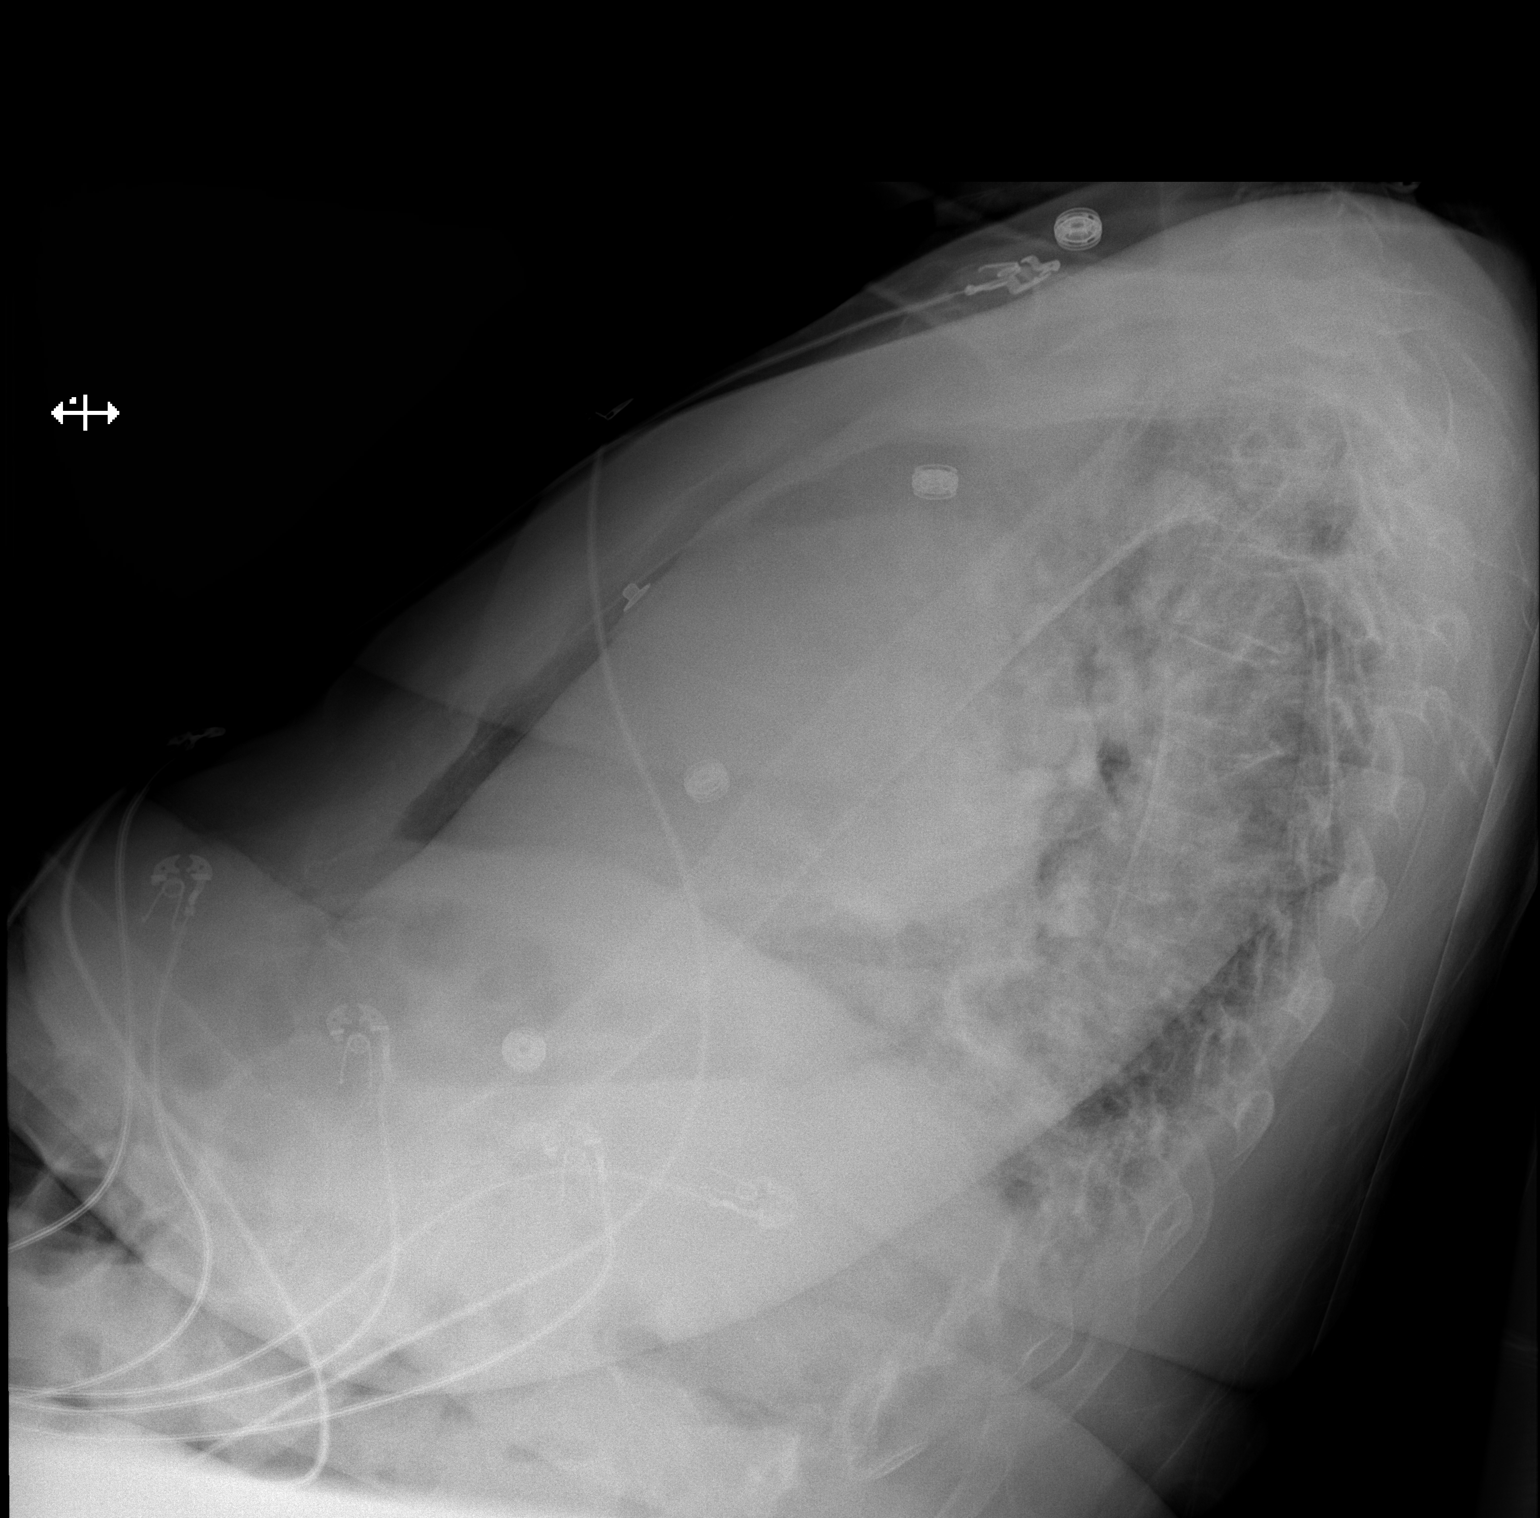

[x chest ap]
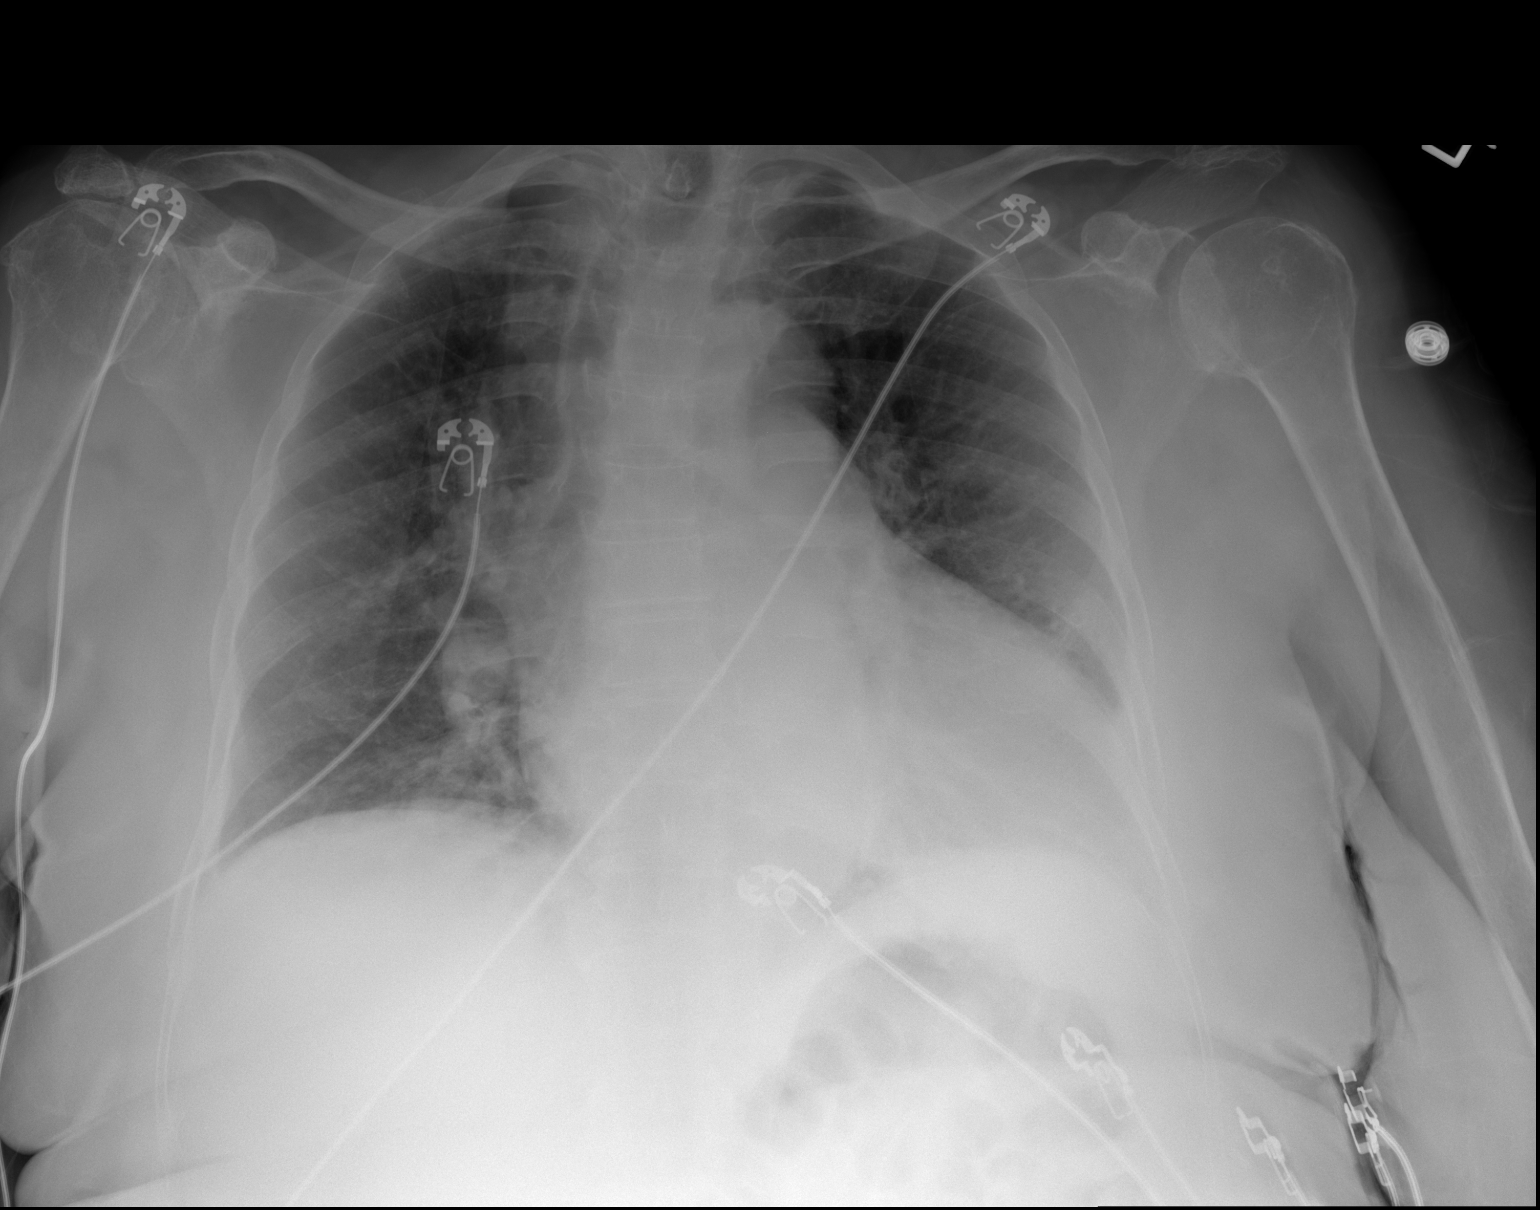

[2 of 2 positions shown; findings below may reference images not displayed]

FINDINGS: Cardiomegaly with vascular congestion. No confluent opacities,
effusions or edema. No acute bony abnormality.
IMPRESSION: Cardiomegaly, vascular congestion.

## 2016-08-25 ENCOUNTER — Ambulatory Visit: Payer: Self-pay | Admitting: Physical Therapy

## 2016-09-01 ENCOUNTER — Ambulatory Visit: Payer: Self-pay | Admitting: Physical Therapy

## 2016-09-05 ENCOUNTER — Ambulatory Visit (HOSPITAL_BASED_OUTPATIENT_CLINIC_OR_DEPARTMENT_OTHER): Payer: Self-pay | Admitting: Physical Medicine & Rehabilitation

## 2016-09-05 ENCOUNTER — Encounter: Payer: Self-pay | Admitting: Physical Medicine & Rehabilitation

## 2016-09-05 ENCOUNTER — Encounter: Payer: Self-pay | Attending: Physical Medicine & Rehabilitation

## 2016-09-05 VITALS — BP 143/99 | HR 77

## 2016-09-05 DIAGNOSIS — G8110 Spastic hemiplegia affecting unspecified side: Secondary | ICD-10-CM

## 2016-09-05 DIAGNOSIS — Z7982 Long term (current) use of aspirin: Secondary | ICD-10-CM | POA: Insufficient documentation

## 2016-09-05 NOTE — Progress Notes (Signed)
Botox Injection for spasticity using needle EMG guidance  Dilution: 50 Units/ml Indication: Severe spasticity which interferes with ADL,mobility and/or  hygiene and is unresponsive to medication management and other conservative care Informed consent was obtained after describing risks and benefits of the procedure with the patient. This includes bleeding, bruising, infection, excessive weakness, or medication side effects. A REMS form is on file and signed. Needle: 50mm 25 g needle electrode Number of units per muscle FCR50  FDS50 FDP50 FPL25 Pronator25  Left Lower ext VMO 50 U Rectus fem 75U Vast Lat 75U All injections were done after obtaining appropriate EMG activity and after negative drawback for blood. The patient tolerated the procedure well. Post procedure instructions were given. A followup appointment was made.

## 2016-09-05 NOTE — Patient Instructions (Signed)

## 2016-09-13 ENCOUNTER — Telehealth: Payer: Self-pay

## 2016-09-13 ENCOUNTER — Encounter: Payer: Self-pay | Admitting: Neurology

## 2016-09-13 ENCOUNTER — Ambulatory Visit (INDEPENDENT_AMBULATORY_CARE_PROVIDER_SITE_OTHER): Payer: Self-pay | Admitting: Neurology

## 2016-09-13 ENCOUNTER — Encounter (INDEPENDENT_AMBULATORY_CARE_PROVIDER_SITE_OTHER): Payer: Self-pay

## 2016-09-13 VITALS — BP 155/101 | HR 83

## 2016-09-13 DIAGNOSIS — G8110 Spastic hemiplegia affecting unspecified side: Secondary | ICD-10-CM

## 2016-09-13 DIAGNOSIS — I610 Nontraumatic intracerebral hemorrhage in hemisphere, subcortical: Secondary | ICD-10-CM

## 2016-09-13 DIAGNOSIS — R569 Unspecified convulsions: Secondary | ICD-10-CM

## 2016-09-13 NOTE — Progress Notes (Signed)
STROKE NEUROLOGY FOLLOW UP NOTE  NAME: Michelle Kemp DOB: 1941-02-14  REASON FOR VISIT: stroke follow up HISTORY FROM: daughter and chart  Today we had the pleasure of seeing Michelle Kemp in follow-up at our Neurology [facility]. Pt was accompanied by daughter. Daughter acted as Equities trader.    History Summary 76 yo female from [place] with hx of arthritis is following up in stroke clinic for stroke. As per daughter, she was having vacation in [place] on 11/07/14 when she developed acute onset left arm and leg weakness, left leg stiff and spastic, with facial droop, left side drooling, and slurry speech. She was sent to nearby hospital, BP 200/120 and CT head on 11/13/14 showed right frontal hemorrhage. She was treated conservatively. 12/13/14 had MRA head showed diffuse intracranial atherosclerosis. 01/11/15 repeat CT showed near resolution of ICH. She was then travelled back from [place] to [place]. She denies any smoking, alcohol or illicit drugs.  Follow up 04/12/15 - she has been working with PT/OT for strength training. She saw Dr. Lucia Gaskins for follow up, has ordered MRI and MRA but not done due to insurance coverage. Daughter has been working with Child psychotherapist to get her disability approved.  She still has spastic left arm and leg, but also developed left shoulder, elbow, hip and knee pain which limited her rehab process.   ED visit 05/16/15 - as per chart, patient sent to ED for stroke evaluation. MRI showed no acute stroke. Patient sent back to home. However, asking for daughter, she stated that patient home starting have left facial twitching, and left arm and leg shaking jerking, no loss of the bowel bladder function, no loss of consciousness. When EMS came over, symptoms resolving, with increased weakness on the left arm and leg. In ED, patient back to her baseline. Patient was sent back home without medication changes.  06/21/15 follow up - patient had been doing the same. Her left arm and leg stiffness  since getting worse, decreased range of motion on shoulder, elbow and knee. With overstretching, patient started to feel pain in joints. Her MRI in November showed chronic right frontal ICH, no CAA, did not favor hemorrhagic transformation. She had MRA done today showed diffuse intracranial stenosis. 2-D Echo unremarkable. Carotid Doppler not done yet. Had recent lab with PCP, was told everything is good. Blood pressure at home ranging from 112-142. Today in clinic 124/90. Patient had PT OT twice a week.  09/15/15 follow up - pt has been doing well. No recurrent seizure or stroke. Continued with PT/OT twice a week and also followed with Dr. Wynn Banker for botox injection and rehab management. She left LE strength improved and able to walk with walker briefly at home. Daughter showed me her video at home. However, for the last session of PT, she started to have left knee and hip pain, therefore today she came in with wheelchair. Her left UE still has spastic paralysis, not able to move with limited ROM of left shoulder. Her CUS and EEG were negative and A1C 4.7 with LDL 107 in 04/2015. Today BP 150/95. She is on ASA.   03/16/16 follow up - pt has been doing better. Able to walk with cane and the help from daughter for short distance, then she got tired and SOB. As per daughter, pt at home do 40 feet in the am and 50 feet in pm with cane and help from daughter. However, she still has a lot of spasticity at left UE and LE especially at  night, with pain at hip and shoulder. Currently, she is getting botox and steroids injection from Dr. Wynn Banker. BP today 125/81. Daughter reports one episode of left facial twitching lasting seconds. Other than that, no seizure like activity and tolerating with keppra well. She is taking baclofen 10mg  bid now. I asked daughter to increase baclofen dose at night to 20mg .   Interval History During the interval time, pt has been doing the same. Able to walk with cane but need moderate  assistance from daughter. Exhausted with small distance walking. Follows with Dr. Wynn Banker for botox injection, left sided spasticity is better. BP still high 155/101. On baclofen 10/20. Daughter stated that keppra made her drowsy sleepy so she is only on half dose of keppra. Occasional HA on the left, not taking any meds.   REVIEW OF SYSTEMS: Full 14 system review of systems performed and notable only for those listed below and in HPI above, all others are negative:  Constitutional:   Cardiovascular:  Ear/Nose/Throat:   Skin:  Eyes:  Light sensitivity Respiratory:  wheezing Gastroitestinal:  consitpation Genitourinary:  Hematology/Lymphatic:   Endocrine:  Musculoskeletal:   Allergy/Immunology:   Neurological:  HA Psychiatric:  Sleep:   The following represents the patient's updated allergies and side effects list: No Known Allergies  The neurologically relevant items on the patient's problem list were reviewed on today's visit.  Neurologic Examination  A problem focused neurological exam (12 or more points of the single system neurologic examination, vital signs counts as 1 point, cranial nerves count for 8 points) was performed.  Blood pressure (!) 155/101, pulse 83.  General - Well nourished, well developed, in no apparent distress.  Ophthalmologic - Fundi not visualized due to noncooperation.  Cardiovascular - intermittent premature beat.  Mental Status -  Level of arousal and orientation to time, place, and person were intact. Language including expression, naming, repetition, comprehension was assessed and found intact. Fund of Knowledge was assessed and was impaired.  Cranial Nerves II - XII - II - Visual field intact OU. III, IV, VI - Extraocular movements intact. V - Facial sensation intact bilaterally. VII - mild left nasolabial fold flattening. VIII - Hearing & vestibular intact bilaterally. X - Palate elevates symmetrically. XI - Chin turning & shoulder  shrug intact bilaterally. XII - Tongue protrusion intact.  Motor Strength - The patient's strength was LUE 0/5 proximal and 0/5 distal, LLE 3-/5 proximal and 3/5 knee extension and 0/5 distally.  Bulk was normal and fasciculations were absent.   Motor Tone - Muscle tone was assessed at the neck and appendages and was increased left UE and LE, but much improved from last time.   Reflexes - The patient's reflexes were 2+ in all extremities and she had no pathological reflexes.  Sensory - Light touch, temperature/pinprick were assessed and were normal.    Coordination - The patient had normal movements in the right hand with no ataxia or dysmetria.  Tremor was absent.  Gait and Station - in wheelchair, but able to walk with cane with moderate help from daughter for 10 steps in room, exhausted after.  Data reviewed: I personally reviewed the images and agree with the radiology interpretations.  11/13/14 CT head - right frontal hemorrhage with remote small left cerebellar infarct  12/13/14 MRA head - diffuse intracranial atherosclerosis  01/11/15 CT head - resolving right frontal hemorrhage with remote small left cerebellar infarct.  MRI brain 05/16/2015 1. No acute infarct identified. 2. Posterior right frontal lobe encephalomalacia with  internal complex fluid, favored to be hemorrhage with unresolved extracellular methemoglobin. Surrounding gliosis. No associated mass effect. 3. Intracranial artery dolichoectasia. Chronic lacunar infarcts Elsewhere.  MRA head 06/21/2015 1. Possible mild stenosis within the M2 segment of the right middle cerebral artery. This is less apparent on the source images and could be artifactual. 2. Reduced flow of the left vertebral artery. No focal stenosis is noted. If clinically indicated, consider MR angiogram of the neck arteries to assess the origin of the vertebral artery. 3. Small regions of focal stenosis within the right posterior cerebral  artery. 4. No aneurysms were identified.  2-D echo 05/14/2015 - Left ventricle: The cavity size was normal. Systolic function was normal. The estimated ejection fraction was in the range of 60% to 65%. Wall motion was normal; there were no regional wall motion abnormalities. Doppler parameters are consistent with abnormal left ventricular relaxation (grade 1 diastolic dysfunction). There was no evidence of elevated ventricular filling pressure by Doppler parameters. - Aortic valve: There was mild regurgitation. - Aortic root: The aortic root was normal in size. - Mitral valve: Structurally normal valve. There was mild regurgitation. - Left atrium: The atrium was normal in size. - Right ventricle: The cavity size was normal. Wall thickness was normal. Systolic function was normal. - Right atrium: The atrium was normal in size. - Tricuspid valve: There was trivial regurgitation. - Pulmonic valve: There was trivial regurgitation. - Pulmonary arteries: Systolic pressure was within the normal range. - Inferior vena cava: The vessel was normal in size. - Pericardium, extracardiac: There was no pericardial effusion.  Labs 04/27/15 A1C 4.7 Total Choles 193 LDL 107 HDL 71 TG 75 Cre 0.82  Assessment: As you may recall, she is a 76 y.o. African American female with PMH of arthritis had acute onset left sided weakness and dysarthria on 11/07/14. CT was done on 11/13/14 showed right frontal ICH. She was treated conservatively. MRA in 12/2014 showed diffuse atherosclerosis. Repeat CT head in 01/2015 showed near resolution of ICH with remote small left cerebellar infarct. Due to the delay of CT as well as without MRI image, it is hard to tell for sure it was ICH vs. Hemorrhagic infarct. Since the hematoma is absorbed, will start her on ASA 81. Had ED visit on 05/16/2015 for left facial twitching and left arm and leg shaking jerking lasting short period time, concerning first simple  partial seizure. MRI brain favor chronic right frontal ICH. MRA head again showed diffuse intracranial atherosclerosis. TTE, CUS and EEG unremarkable, A1C 4.7 and LDL 107. Continue PT/OT and follow up with Dr. Wynn BankerKirsteins for botox and left sided spasticity post stroke. Continue Keppra for seizure control. Pt daughter report one episode of left facial twitching during the interval time, but short lasting, only seconds. Still has significant left spastic hemiplegia. Able to walk with cane a little bit with help from daughter. Follows with Dr. Wynn BankerKirsteins for botox injection, much improved spasticity. Finished outpt PT/OT and now home PT/OT. Daughter stated keppra side effect of drowsy sleepy, and daughter only gives her half dose now, no seizure during interval time, will taper off keppra this time.   Plan:  - continue ASA for stroke prevention - tape off keppra - 250mg  once a day for a week and then off.  - follow up with Dr. Wynn BankerKirsteins for botox injection and left spastic hemiplegia  - can take tylenol for HA as needed. - Follow up with your primary care physician for stroke risk factor modification. Recommend maintain  blood pressure goal <130/80, diabetes with hemoglobin A1c goal below 6.5% and lipids with LDL cholesterol goal below 70 mg/dL.  - continue home exercise and home PT/OT, gradually increase activity - check BP at home. - follow up in 6 months.  A total of 25 minutes was spent face-to-face with this patient. Over half this time was spent on counseling patient on the continued home PT OT and home exercise, follow up with botox injection as well as tape off keppra.   No orders of the defined types were placed in this encounter.   No orders of the defined types were placed in this encounter.   Patient Instructions  - continue ASA for stroke prevention - take keppra 250mg  once a day for a week and then off.  - follow up with Dr. Wynn Banker for botox injection and left spastic hemiplegia   - take tylenol for HA as needed basis. - Follow up with your primary care physician for stroke risk factor modification. Recommend maintain blood pressure goal <130/80, diabetes with hemoglobin A1c goal below 6.5% and lipids with LDL cholesterol goal below 70 mg/dL.  - continue home exercise and home PT/OT, gradually increase activity - check BP at home. - follow up in 6 months.   Marvel Plan, MD PhD [facility] [address] [place], [place] [address] [phone]  L

## 2016-09-13 NOTE — Patient Instructions (Addendum)
-   continue ASA for stroke prevention - take keppra 250mg  once a day for a week and then off.  - follow up with Dr. Wynn BankerKirsteins for botox injection and left spastic hemiplegia  - take tylenol for HA as needed basis. - Follow up with your primary care physician for stroke risk factor modification. Recommend maintain blood pressure goal <130/80, diabetes with hemoglobin A1c goal below 6.5% and lipids with LDL cholesterol goal below 70 mg/dL.  - continue home exercise and home PT/OT, gradually increase activity - check BP at home. - follow up in 6 months.

## 2016-09-13 NOTE — Telephone Encounter (Signed)
Rn gave patients a printout of the mychart message directions. Rn advise Michelle Kemp to use mychart if she is unable to leave a message or is on hold for a period of time. Michelle Kemp verbalized understanding.

## 2016-10-09 ENCOUNTER — Encounter: Payer: Self-pay | Admitting: Physical Medicine & Rehabilitation

## 2016-10-20 ENCOUNTER — Ambulatory Visit: Payer: Self-pay | Admitting: Physical Medicine & Rehabilitation

## 2016-11-10 ENCOUNTER — Encounter: Payer: Self-pay | Attending: Physical Medicine & Rehabilitation

## 2016-11-10 ENCOUNTER — Ambulatory Visit: Payer: Self-pay | Admitting: Physical Medicine & Rehabilitation

## 2016-11-10 DIAGNOSIS — G8110 Spastic hemiplegia affecting unspecified side: Secondary | ICD-10-CM | POA: Insufficient documentation

## 2016-11-10 DIAGNOSIS — Z7982 Long term (current) use of aspirin: Secondary | ICD-10-CM | POA: Insufficient documentation

## 2017-03-26 ENCOUNTER — Telehealth: Payer: Self-pay

## 2017-03-26 ENCOUNTER — Ambulatory Visit: Payer: Self-pay | Admitting: Neurology

## 2017-03-26 NOTE — Telephone Encounter (Signed)
Patient no show for appt today. 

## 2017-03-27 ENCOUNTER — Encounter: Payer: Self-pay | Admitting: Neurology

## 2020-12-08 DEATH — deceased
